# Patient Record
Sex: Female | Born: 1990 | Race: White | Hispanic: No | Marital: Married | Smoking: Never smoker
Health system: Southern US, Community
[De-identification: ages and names within clinical notes are randomized; demographics above are authoritative.]

## PROBLEM LIST (undated history)

## (undated) ENCOUNTER — Inpatient Hospital Stay (HOSPITAL_COMMUNITY): Payer: Self-pay

## (undated) DIAGNOSIS — R7303 Prediabetes: Secondary | ICD-10-CM

## (undated) DIAGNOSIS — N200 Calculus of kidney: Secondary | ICD-10-CM

## (undated) DIAGNOSIS — Z87442 Personal history of urinary calculi: Secondary | ICD-10-CM

## (undated) HISTORY — PX: LEG SURGERY: SHX1003

## (undated) HISTORY — PX: WISDOM TOOTH EXTRACTION: SHX21

## (undated) HISTORY — PX: KNEE ARTHROSCOPY: SUR90

## (undated) HISTORY — PX: OTHER SURGICAL HISTORY: SHX169

## (undated) HISTORY — DX: Prediabetes: R73.03

## (undated) HISTORY — PX: HARDWARE REMOVAL: SHX979

## (undated) HISTORY — PX: TONSILLECTOMY: SUR1361

---

## 2016-10-27 DIAGNOSIS — E6609 Other obesity due to excess calories: Secondary | ICD-10-CM | POA: Insufficient documentation

## 2016-10-27 DIAGNOSIS — Z6839 Body mass index (BMI) 39.0-39.9, adult: Secondary | ICD-10-CM | POA: Insufficient documentation

## 2017-08-08 DIAGNOSIS — Z87442 Personal history of urinary calculi: Secondary | ICD-10-CM

## 2017-08-08 HISTORY — DX: Personal history of urinary calculi: Z87.442

## 2017-10-24 DIAGNOSIS — Z8419 Family history of other disorders of kidney and ureter: Secondary | ICD-10-CM | POA: Insufficient documentation

## 2017-10-24 DIAGNOSIS — Z841 Family history of disorders of kidney and ureter: Secondary | ICD-10-CM | POA: Insufficient documentation

## 2018-05-29 ENCOUNTER — Telehealth: Payer: Self-pay | Admitting: *Deleted

## 2018-05-29 DIAGNOSIS — O3680X0 Pregnancy with inconclusive fetal viability, not applicable or unspecified: Secondary | ICD-10-CM

## 2018-05-29 NOTE — Telephone Encounter (Signed)
Received a voicemail  from [facility] from [place] ,[place] stating this patient has + pregnancy test which made her 397w6d according to LMP and has regular periods. States on ultrasound she measured 8924w6d and obtained FHR 3 different measurements- all in 80's.  States she needs followup and referred to our office. She also stated Patient denies bleeding , cramping and had a little spotting a week ago after intercourse.  I called [facility] and left message we have assumed care for the patient ; also asking for more information re: patient so that I can find chart. Called patient and  Left message I am calling to discuss making appointment , please call our office.  [facility] called back and left more information re: spelling of patient name, Date of birth.  Patient called back also and left message she is returning our call and was told [facility] we would call. I called Shabana and informed her we would set up an appointment for an ultrasound. I confirmed [facility] and I explained I would have registrar make an account for her and then I would order Kore[place] and call her back with appointment. She voices understanding.  I scheduled US and called Rae with the appointment and to come to office afterwards for results. She voices understanding.

## 2018-06-05 ENCOUNTER — Ambulatory Visit (HOSPITAL_COMMUNITY): Payer: Self-pay

## 2018-06-07 ENCOUNTER — Ambulatory Visit (HOSPITAL_COMMUNITY)
Admission: RE | Admit: 2018-06-07 | Discharge: 2018-06-07 | Disposition: A | Discharge status: Home / Self Care | Payer: Medicaid Other | Source: Ambulatory Visit | Attending: Obstetrics and Gynecology | Admitting: Obstetrics and Gynecology

## 2018-06-07 ENCOUNTER — Ambulatory Visit (INDEPENDENT_AMBULATORY_CARE_PROVIDER_SITE_OTHER): Payer: Self-pay | Admitting: *Deleted

## 2018-06-07 DIAGNOSIS — Z3A00 Weeks of gestation of pregnancy not specified: Secondary | ICD-10-CM | POA: Diagnosis not present

## 2018-06-07 DIAGNOSIS — O3680X0 Pregnancy with inconclusive fetal viability, not applicable or unspecified: Secondary | ICD-10-CM

## 2018-06-07 NOTE — Progress Notes (Signed)
Pt here for ultrasound results. She is referred from [facility] who obtained an ultrasound with FHR in the 80s.  Repeat ultrasound scheduled at that time and performed today.  Pt denies any bleeding, pain, cramping, or nausea/vomiting. Discussed results and pt history with Dr. Macon LargeAnyanwu and she ordered a repeat ultrasound in 7-10 days. US ordered and scheduled for 06/17/18 @ 0800.  Arrive at 0745 with a full bladder.  Pt verbalized understanding.

## 2018-06-07 NOTE — Progress Notes (Signed)
I have reviewed the chart and agree with nursing staff's documentation of this patient's encounter.  Jaynie CollinsUgonna Lemuel Boodram, MD 06/07/2018 11:25 AM

## 2018-06-12 ENCOUNTER — Inpatient Hospital Stay (HOSPITAL_COMMUNITY): Payer: Medicaid Other

## 2018-06-12 ENCOUNTER — Encounter (HOSPITAL_COMMUNITY): Payer: Self-pay | Admitting: *Deleted

## 2018-06-12 ENCOUNTER — Inpatient Hospital Stay (HOSPITAL_COMMUNITY)
Admission: AD | Admit: 2018-06-12 | Discharge: 2018-06-12 | Disposition: A | Discharge status: Home / Self Care | Payer: Medicaid Other | Source: Ambulatory Visit | Attending: Family Medicine | Admitting: Family Medicine

## 2018-06-12 DIAGNOSIS — Z3A08 8 weeks gestation of pregnancy: Secondary | ICD-10-CM | POA: Diagnosis not present

## 2018-06-12 DIAGNOSIS — O021 Missed abortion: Secondary | ICD-10-CM | POA: Insufficient documentation

## 2018-06-12 DIAGNOSIS — O209 Hemorrhage in early pregnancy, unspecified: Secondary | ICD-10-CM | POA: Diagnosis present

## 2018-06-12 DIAGNOSIS — Z6790 Unspecified blood type, Rh positive: Secondary | ICD-10-CM

## 2018-06-12 HISTORY — DX: Calculus of kidney: N20.0

## 2018-06-12 LAB — URINALYSIS, ROUTINE W REFLEX MICROSCOPIC
BILIRUBIN URINE: NEGATIVE
GLUCOSE, UA: NEGATIVE mg/dL
Ketones, ur: NEGATIVE mg/dL
LEUKOCYTES UA: NEGATIVE
NITRITE: POSITIVE — AB
Protein, ur: 100 mg/dL — AB
SPECIFIC GRAVITY, URINE: 1.025 (ref 1.005–1.030)
pH: 5 (ref 5.0–8.0)

## 2018-06-12 LAB — HCG, QUANTITATIVE, PREGNANCY: HCG, BETA CHAIN, QUANT, S: 13936 m[IU]/mL — AB (ref <5)

## 2018-06-12 LAB — CBC
HEMATOCRIT: 34.9 % — AB (ref 36.0–46.0)
Hemoglobin: 10.8 g/dL — ABNORMAL LOW (ref 12.0–15.0)
MCH: 23.2 pg — ABNORMAL LOW (ref 26.0–34.0)
MCHC: 30.9 g/dL (ref 30.0–36.0)
MCV: 75.1 fL — AB (ref 80.0–100.0)
NRBC: 0 % (ref 0.0–0.2)
PLATELETS: 250 10*3/uL (ref 150–400)
RBC: 4.65 MIL/uL (ref 3.87–5.11)
RDW: 16.9 % — ABNORMAL HIGH (ref 11.5–15.5)
WBC: 11.1 10*3/uL — ABNORMAL HIGH (ref 4.0–10.5)

## 2018-06-12 LAB — URINALYSIS, MICROSCOPIC (REFLEX)

## 2018-06-12 LAB — ABO/RH: ABO/RH(D): O POS

## 2018-06-12 MED ORDER — IBUPROFEN 600 MG PO TABS: 600.0000 mg | ORAL_TABLET | Freq: Four times a day (QID) | ORAL | 0 refills | Status: DC | PRN

## 2018-06-12 MED ORDER — MISOPROSTOL 200 MCG PO TABS
800.0000 ug | ORAL_TABLET | Freq: Once | ORAL | Status: AC
  Administered 2018-06-12: 800 ug via BUCCAL
  Filled 2018-06-12: qty 4

## 2018-06-12 MED ORDER — OXYCODONE-ACETAMINOPHEN 5-325 MG PO TABS
2.0000 | ORAL_TABLET | Freq: Once | ORAL | Status: AC
  Administered 2018-06-12: 2 via ORAL
  Filled 2018-06-12: qty 2

## 2018-06-12 NOTE — MAU Note (Signed)
Pt called per CNM request, informed prescription will be sent to her pharmacy.  Instructed to return to MAU with excessive bleeding, severe pain, passing out.  Pt verbalizes understanding.

## 2018-06-12 NOTE — MAU Note (Signed)
PT SAYS  SHE CAME [facility]- ON 11-6- 19.     TO COME HERE FOR U/S.    STARTED VAG BLEEDING  ON Sunday-- PINK WHEN SHE WIPES.  - TONIGHT WORSE  -.  HAS TOILET PAPER IN UNDERWEAR- IN TRIAGE- RED BLOOD ON UNDERWEAR . CRAMPS STARTED ON Sunday-  TONIGHT WORSE-  - NO MEDS FOR PAIN.

## 2018-06-12 NOTE — MAU Provider Note (Addendum)
History     CSN: [id number]  Arrival date and time: 06/12/18 [id number]   First Provider Initiated Contact with Patient 06/12/18 [phone]      Chief Complaint  Patient presents with  . Vaginal Bleeding   Leslie Arroyo is a 27 y.o. G1P0 at 4123w6d who presents today with vaginal bleeding. She states that this has been off and on, but today it became much heavier. She states that it is heavier than a period and she is passing "large clots". Patient had US on 06/07/18 that showed IUP with no cardiac activity. She is scheduled for FU on 06/17/18.   Vaginal Bleeding  The patient's primary symptoms include pelvic pain and vaginal bleeding. This is a new problem. The current episode started today. The problem occurs constantly. The problem has been unchanged. Pain severity now: 8/10. The problem affects both sides. She is pregnant. Pertinent negatives include no chills or fever. The vaginal discharge was bloody. The vaginal bleeding is heavier than menses. She has been passing clots. She has not been passing tissue. Her menstrual history has been regular (LMP 04/04/2018).    OB History    Gravida  1   Para      Term      Preterm      AB      Living        SAB      TAB      Ectopic      Multiple      Live Births              Past Medical History:  Diagnosis Date  . Kidney stones   . Kidney stones     Past Surgical History:  Procedure Laterality Date  . broken leg    . KNEE ARTHROSCOPY      History reviewed. No pertinent family history.  Social History   Tobacco Use  . Smoking status: Never Smoker  . Smokeless tobacco: Never Used  Substance Use Topics  . Alcohol use: Not Currently  . Drug use: Never    Allergies: No Known Allergies  No medications prior to admission.    Review of Systems  Constitutional: Negative for chills and fever.  Genitourinary: Positive for pelvic pain and vaginal bleeding.   Physical Exam   Blood pressure 137/77, pulse 86,  temperature 98.3 F (36.8 C), temperature source Oral, resp. rate 20, height 5\' 6"  (1.676 m), weight 112.2 kg, last menstrual period 04/12/2018.  Physical Exam  Nursing note and vitals reviewed. Constitutional: She is oriented to person, place, and time. She appears well-developed and well-nourished. She appears distressed (crying ).  HENT:  Head: Normocephalic.  Cardiovascular: Normal rate.  Respiratory: Effort normal.  GI: Soft.  Genitourinary:  Genitourinary Comments: Large amount of blood on her pad. One large clot about the size of an apple. Speculum exam deferred at this time.   Neurological: She is alert and oriented to person, place, and time.  Skin: Skin is warm and dry.  Psychiatric: She has a normal mood and affect.   Results for orders placed or performed during the hospital encounter of 06/12/18 (from the past 24 hour(s))  Urinalysis, Routine w reflex microscopic     Status: Abnormal   Collection Time: 06/12/18  5:31 AM  Result Value Ref Range   Color, Urine YELLOW YELLOW   APPearance CLEAR CLEAR   Specific Gravity, Urine 1.025 1.005 - 1.030   pH 5.0 5.0 - 8.0   Glucose, UA NEGATIVE  NEGATIVE mg/dL   Hgb urine dipstick LARGE (A) NEGATIVE   Bilirubin Urine NEGATIVE NEGATIVE   Ketones, ur NEGATIVE NEGATIVE mg/dL   Protein, ur 161 (A) NEGATIVE mg/dL   Nitrite POSITIVE (A) NEGATIVE   Leukocytes, UA NEGATIVE NEGATIVE  Urinalysis, Microscopic (reflex)     Status: Abnormal   Collection Time: 06/12/18  5:31 AM  Result Value Ref Range   RBC / HPF 21-50 0 - 5 RBC/hpf   WBC, UA 21-50 0 - 5 WBC/hpf   Bacteria, UA MANY (A) NONE SEEN   Squamous Epithelial / LPF 0-5 0 - 5  CBC     Status: Abnormal   Collection Time: 06/12/18  6:04 AM  Result Value Ref Range   WBC 11.1 (H) 4.0 - 10.5 K/uL   RBC 4.65 3.87 - 5.11 MIL/uL   Hemoglobin 10.8 (L) 12.0 - 15.0 g/dL   HCT 09.6 (L) 04.5 - 40.9 %   MCV 75.1 (L) 80.0 - 100.0 fL   MCH 23.2 (L) 26.0 - 34.0 pg   MCHC 30.9 30.0 - 36.0 g/dL    RDW 81.1 (H) 91.4 - 15.5 %   Platelets 250 150 - 400 K/uL   nRBC 0.0 0.0 - 0.2 %  ABO/Rh     Status: None (Preliminary result)   Collection Time: 06/12/18  6:04 AM  Result Value Ref Range   ABO/RH(D)      O POS Performed at [facility], [address] [place], [place] [address]    US Ob Transvaginal  Result Date: 06/12/2018 CLINICAL DATA:  27 year old pregnant female with vaginal bleeding. LMP: 04/12/2018 corresponding to an estimated gestational age of [redacted] weeks, 5 days. The estimated gestational age based on the first ultrasound is 7 weeks, 0 days. EXAM: OBSTETRIC <14 WK [place] AND TRANSVAGINAL OB US TECHNIQUE: Both transabdominal and transvaginal ultrasound examinations were performed for complete evaluation of the gestation as well as the maternal uterus, adnexal regions, and pelvic cul-de-sac. Transvaginal technique was performed to assess early pregnancy. COMPARISON:  Ultrasound dated 06/07/2018 FINDINGS: Intrauterine gestational sac: Single teardrop appearing gestational sac in the lower endometrium, lower in location than prior ultrasound. Yolk sac:  Not seen Embryo:  Present Cardiac Activity: Not detected CRL:  10 mm   7 w   0 d Subchorionic hemorrhage:  None visualized. Maternal uterus/adnexae: The maternal ovaries are not visualized. IMPRESSION: Single intrauterine pregnancy with an estimated gestational age of [redacted] weeks, 0 days. The gestational sac located abnormally in the lower endometrium. No fetal cardiac activity identified. Findings meet definitive criteria for failed pregnancy. This follows SRU consensus guidelines: Diagnostic Criteria for Nonviable Pregnancy Early in the First Trimester. [facility]acy Mis J Med [phone]. These results were called by telephone at the time of interpretation on 06/12/2018 at 6:48 am to nurse midwife [facility] , who verbally acknowledged these results. Electronically Signed   By: Elgie Collard M.D.   On: 06/12/2018 06:53   MAU Course   Procedures  MDM DW patient and offered Cytotec and close observation here due to heavy bleeding and tissue is in LUS and will likely pass soon. Patient agreeable with plan. Will give Cytotec and pain medication and continue to monitor. Emotional support provided.   0830: Received care of [place] from [place], [place]  6295: Pt reports passing fist sized clot in BR. Speculum exam: large blood and clots in vault, POC protruding from os, removed easily with ring forceps and sent to path. Bleeding minimal. Will monitor bleeding and discharge home.  RN reports pt left before signing d/c paperwork   Assessment and Plan   1. Missed abortion   2. Blood type, Rh positive    DC home Comfort measures reviewed  Bleeding precautions RX: ibuprofen 600mg  PRN Return to MAU as needed FU with [facility]OC in 2 weeks- message sent    Allergies as of 06/12/2018   No Known Allergies     Medication List    TAKE these medications   ibuprofen 600 MG tablet Commonly known as:  ADVIL,MOTRIN Take 1 tablet (600 mg total) by mouth every 6 (six) hours as needed.      Donette Larry, CNM  06/12/2018 1:42 PM

## 2018-06-12 NOTE — MAU Note (Signed)
Pt's room found empty, pt did not inform staff she was leaving.

## 2018-06-13 LAB — CULTURE, OB URINE: Culture: <10000 — AB

## 2018-06-17 ENCOUNTER — Ambulatory Visit (HOSPITAL_COMMUNITY): Payer: Self-pay

## 2018-07-01 ENCOUNTER — Encounter: Payer: Self-pay | Admitting: Advanced Practice Midwife

## 2018-07-01 ENCOUNTER — Ambulatory Visit (INDEPENDENT_AMBULATORY_CARE_PROVIDER_SITE_OTHER): Payer: Self-pay | Admitting: Advanced Practice Midwife

## 2018-07-01 VITALS — BP 141/97 | HR 80 | Wt 247.5 lb

## 2018-07-01 DIAGNOSIS — O039 Complete or unspecified spontaneous abortion without complication: Secondary | ICD-10-CM

## 2018-07-01 NOTE — Progress Notes (Signed)
  Subjective:     Patient ID: Leslie Arroyo, female   DOB: December 25, 1990, 27 y.o.   MRN: [id number]  Leslie AlphaMelissa Stander is a 27 y.o. G1P0 who is here today for SAB follow up. She was seen in MAU on 06/12/18 and dx with SAB. She had heavy bleeding at that time, and was given cytotec in MAU. After a period of observation patient was still bleeding and on exam POCs found at the cervix and removed by CNM in MAU. Patient is here today, and reports minimal bleeding at this time. Patient and partner reports normal processing through miscarriage emotions.   Review of Systems  Constitutional: Negative for chills and fever.  Gastrointestinal: Negative for nausea and vomiting.  Genitourinary: Positive for vaginal bleeding. Negative for pelvic pain.   Pathology report:  Diagnosis Products of Conception, uterine contents - PRODUCTS OF CONCEPTION (CHORIONIC VILLI PRESENT).  Most recent HCG   Ref. Range 06/12/2018 06:04  HCG, Beta Chain, Quant, S Latest Ref Range: <5 mIU/mL 13,936 (H)   Objective:   Physical Exam Vitals signs and nursing note reviewed.  Constitutional:      General: She is not in acute distress. HENT:     Head: Normocephalic.  Cardiovascular:     Rate and Rhythm: Normal rate.  Pulmonary:     Effort: Pulmonary effort is normal.  Skin:    General: Skin is warm and dry.  Neurological:     Mental Status: She is alert and oriented to person, place, and time.  Psychiatric:        Mood and Affect: Mood normal.    Assessment:   1. Miscarriage     Plan:   Orders Placed This Encounter  Procedures  . CBC  . Beta hCG quant (ref lab)   - Emotional support provided - Offered for patient to see Asher MuirJamie today or any time as they continue to move through the process - Will recheck HCG as needed    Thressa ShellerHeather Hogan DNP, CNM  07/01/18  8:39 AM

## 2018-07-02 ENCOUNTER — Telehealth: Payer: Self-pay | Admitting: *Deleted

## 2018-07-02 LAB — BETA HCG QUANT (REF LAB): hCG Quant: 20 m[IU]/mL

## 2018-07-02 LAB — CBC
Hematocrit: 32.4 % — ABNORMAL LOW (ref 34.0–46.6)
Hemoglobin: 10.1 g/dL — ABNORMAL LOW (ref 11.1–15.9)
MCH: 22.4 pg — AB (ref 26.6–33.0)
MCHC: 31.2 g/dL — ABNORMAL LOW (ref 31.5–35.7)
MCV: 72 fL — ABNORMAL LOW (ref 79–97)
Platelets: 359 10*3/uL (ref 150–450)
RBC: 4.51 x10E6/uL (ref 3.77–5.28)
RDW: 16 % — ABNORMAL HIGH (ref 12.3–15.4)
WBC: 9.9 10*3/uL (ref 3.4–10.8)

## 2018-07-02 NOTE — Telephone Encounter (Signed)
Called pt to inform her that her bhcg level was 20 and that, per the provider, she no longer needs to have these levels followed. Pt verbalized understanding.

## 2018-07-02 NOTE — Telephone Encounter (Signed)
-----   Message from Armando ReichertHeather D Hogan, CNM sent at 07/02/2018 10:12 AM EST ----- Patient hcg is less than 25. No follow up needed. Please call patient.

## 2018-09-14 ENCOUNTER — Emergency Department (HOSPITAL_COMMUNITY)
Admission: EM | Admit: 2018-09-14 | Discharge: 2018-09-14 | Disposition: A | Discharge status: Home / Self Care | Payer: Medicaid Other | Attending: Emergency Medicine | Admitting: Emergency Medicine

## 2018-09-14 ENCOUNTER — Encounter (HOSPITAL_COMMUNITY): Payer: Self-pay | Admitting: Emergency Medicine

## 2018-09-14 ENCOUNTER — Other Ambulatory Visit: Payer: Self-pay

## 2018-09-14 ENCOUNTER — Emergency Department (HOSPITAL_COMMUNITY): Payer: Medicaid Other

## 2018-09-14 DIAGNOSIS — R1032 Left lower quadrant pain: Secondary | ICD-10-CM | POA: Insufficient documentation

## 2018-09-14 DIAGNOSIS — R109 Unspecified abdominal pain: Secondary | ICD-10-CM

## 2018-09-14 DIAGNOSIS — N390 Urinary tract infection, site not specified: Secondary | ICD-10-CM

## 2018-09-14 DIAGNOSIS — N23 Unspecified renal colic: Secondary | ICD-10-CM

## 2018-09-14 LAB — BASIC METABOLIC PANEL
ANION GAP: 9 (ref 5–15)
BUN: 16 mg/dL (ref 6–20)
CALCIUM: 8.7 mg/dL — AB (ref 8.9–10.3)
CO2: 22 mmol/L (ref 22–32)
Chloride: 107 mmol/L (ref 98–111)
Creatinine, Ser: 0.99 mg/dL (ref 0.44–1.00)
GFR calc Af Amer: >60 mL/min (ref >60)
GFR calc non Af Amer: >60 mL/min (ref >60)
Glucose, Bld: 132 mg/dL — ABNORMAL HIGH (ref 70–99)
Potassium: 4.2 mmol/L (ref 3.5–5.1)
Sodium: 138 mmol/L (ref 135–145)

## 2018-09-14 LAB — CBC WITH DIFFERENTIAL/PLATELET
Abs Immature Granulocytes: 0.07 10*3/uL (ref 0.00–0.07)
Basophils Absolute: 0.1 10*3/uL (ref 0.0–0.1)
Basophils Relative: 1 %
Eosinophils Absolute: 0.1 10*3/uL (ref 0.0–0.5)
Eosinophils Relative: 1 %
HCT: 36.7 % (ref 36.0–46.0)
Hemoglobin: 10.6 g/dL — ABNORMAL LOW (ref 12.0–15.0)
Immature Granulocytes: 1 %
Lymphocytes Relative: 19 %
Lymphs Abs: 2 10*3/uL (ref 0.7–4.0)
MCH: 21.4 pg — ABNORMAL LOW (ref 26.0–34.0)
MCHC: 28.9 g/dL — ABNORMAL LOW (ref 30.0–36.0)
MCV: 74 fL — ABNORMAL LOW (ref 80.0–100.0)
Monocytes Absolute: 0.6 10*3/uL (ref 0.1–1.0)
Monocytes Relative: 6 %
NEUTROS PCT: 72 %
Neutro Abs: 7.4 10*3/uL (ref 1.7–7.7)
Platelets: 324 10*3/uL (ref 150–400)
RBC: 4.96 MIL/uL (ref 3.87–5.11)
RDW: 17 % — ABNORMAL HIGH (ref 11.5–15.5)
WBC: 10.2 10*3/uL (ref 4.0–10.5)
nRBC: 0 % (ref 0.0–0.2)

## 2018-09-14 LAB — URINALYSIS, ROUTINE W REFLEX MICROSCOPIC
Bilirubin Urine: NEGATIVE
Glucose, UA: NEGATIVE mg/dL
Ketones, ur: NEGATIVE mg/dL
NITRITE: NEGATIVE
PH: 5 (ref 5.0–8.0)
Protein, ur: 30 mg/dL — AB
RBC / HPF: >50 RBC/hpf — ABNORMAL HIGH (ref 0–5)
Specific Gravity, Urine: 1.029 (ref 1.005–1.030)
Squamous Epithelial / HPF: >50 — ABNORMAL HIGH (ref 0–5)
WBC, UA: >50 WBC/hpf — ABNORMAL HIGH (ref 0–5)

## 2018-09-14 LAB — PREGNANCY, URINE: Preg Test, Ur: NEGATIVE

## 2018-09-14 MED ORDER — OXYCODONE-ACETAMINOPHEN 5-325 MG PO TABS: 1.0000 | ORAL_TABLET | ORAL | 0 refills | Status: DC | PRN

## 2018-09-14 MED ORDER — SODIUM CHLORIDE 0.9 % IV SOLN
Freq: Once | INTRAVENOUS | Status: AC
  Administered 2018-09-14: 08:00:00 via INTRAVENOUS

## 2018-09-14 MED ORDER — SODIUM CHLORIDE 0.9 % IV SOLN
2.0000 g | Freq: Once | INTRAVENOUS | Status: AC
  Administered 2018-09-14: 2 g via INTRAVENOUS
  Filled 2018-09-14: qty 20

## 2018-09-14 MED ORDER — SODIUM CHLORIDE 0.9 % IV BOLUS
1000.0000 mL | Freq: Once | INTRAVENOUS | Status: AC
  Administered 2018-09-14: 1000 mL via INTRAVENOUS

## 2018-09-14 MED ORDER — ONDANSETRON 8 MG PO TBDP: 8.0000 mg | ORAL_TABLET | Freq: Three times a day (TID) | ORAL | 0 refills | Status: DC | PRN

## 2018-09-14 MED ORDER — TAMSULOSIN HCL 0.4 MG PO CAPS: 0.4000 mg | ORAL_CAPSULE | Freq: Every day | ORAL | 0 refills | Status: DC

## 2018-09-14 MED ORDER — CEPHALEXIN 500 MG PO CAPS: 500.0000 mg | ORAL_CAPSULE | Freq: Four times a day (QID) | ORAL | 0 refills | Status: DC

## 2018-09-14 MED ORDER — KETOROLAC TROMETHAMINE 15 MG/ML IJ SOLN
15.0000 mg | Freq: Once | INTRAMUSCULAR | Status: AC
  Administered 2018-09-14: 15 mg via INTRAVENOUS
  Filled 2018-09-14: qty 1

## 2018-09-14 MED ORDER — MORPHINE SULFATE (PF) 4 MG/ML IV SOLN
4.0000 mg | Freq: Once | INTRAVENOUS | Status: AC
  Administered 2018-09-14: 4 mg via INTRAVENOUS
  Filled 2018-09-14: qty 1

## 2018-09-14 MED ORDER — ONDANSETRON HCL 4 MG/2ML IJ SOLN
4.0000 mg | Freq: Once | INTRAMUSCULAR | Status: AC
  Administered 2018-09-14: 4 mg via INTRAVENOUS
  Filled 2018-09-14: qty 2

## 2018-09-14 MED ORDER — HYDROMORPHONE HCL 1 MG/ML IJ SOLN
1.0000 mg | Freq: Once | INTRAMUSCULAR | Status: AC
  Administered 2018-09-14: 1 mg via INTRAVENOUS
  Filled 2018-09-14: qty 1

## 2018-09-14 NOTE — ED Triage Notes (Signed)
Patient presents with sudden onset of left flank pain that woke her up. Patient hx kidney stones. States lower body aches yesterday, with some dry heaving. Denies fevers and urinary symptoms. States it feels like previous stones.

## 2018-09-14 NOTE — ED Provider Notes (Signed)
Patient signed to me from Dr. Erma Heritage pending CT results.  Patient with 7 mm kidney stone without hydro-.  Case discussed with Dr. Kathrynn Running from urology who recommends patient follow-up next week in the office.  He reviewed her CT results.  This plan was discussed with the patient and she agrees to this and was given strict return precautions   Lorre Nick, MD 09/14/18 [phone]

## 2018-09-14 NOTE — ED Notes (Signed)
PT DISCHARGED. INSTRUCTIONS AND PRESCRIPTIONS GIVEN. AAOX4. PT IN NO APPARENT DISTRESS WITH MILD PAIN. THE OPPORTUNITY TO ASK QUESTIONS WAS PROVIDED. 

## 2018-09-14 NOTE — Discharge Instructions (Addendum)
Call the urologist office on Monday to schedule an appointment to be seen.  Return here at once should you develop fever, severe pain, or any other problems

## 2018-09-14 NOTE — ED Provider Notes (Signed)
[facility]-EMERGENCY DEPT Provider Note   CSN: [id number] Arrival date & time: 09/14/18  8756    History   Chief Complaint Chief Complaint  Patient presents with  . Flank Pain    HPI Leslie Arroyo is a 28 y.o. female.     HPI   28 yo female with history of kidney stones here with acute onset of left flank pain.  The patient states that starting yesterday afternoon, she developed acute onset of severe, aching, cramp-like, left flank pain.  Is also in her left lower quadrant.  She said nausea but no vomiting.  No change in bowel habits.  No fever or chills.  Since then, the patient has had persistent, worsening pain.  It seems to come and go intermittently, but has been more constant over the last several hours so she presents for evaluation.  She does have history of kidney stones.  No history of previous instrumentation or requirement for lithotripsy.  She was previously seen in another city, but now lives in [place] she does not have a urologist here.  Past Medical History:  Diagnosis Date  . Kidney stones   . Kidney stones     There are no active problems to display for this patient.   Past Surgical History:  Procedure Laterality Date  . broken leg    . KNEE ARTHROSCOPY       OB History    Gravida  1   Para      Term      Preterm      AB      Living        SAB      TAB      Ectopic      Multiple      Live Births               Home Medications    Prior to Admission medications   Medication Sig Start Date End Date Taking? Authorizing Provider  ibuprofen (ADVIL,MOTRIN) 600 MG tablet Take 1 tablet (600 mg total) by mouth every 6 (six) hours as needed. 06/12/18   Donette Larry, CNM    Family History No family history on file.  Social History Social History   Tobacco Use  . Smoking status: Never Smoker  . Smokeless tobacco: Never Used  Substance Use Topics  . Alcohol use: Not Currently  . Drug use: Never      Allergies   Patient has no known allergies.   Review of Systems Review of Systems  Constitutional: Positive for fatigue. Negative for chills and fever.  HENT: Negative for congestion and rhinorrhea.   Eyes: Negative for visual disturbance.  Respiratory: Negative for cough, shortness of breath and wheezing.   Cardiovascular: Negative for chest pain and leg swelling.  Gastrointestinal: Positive for nausea. Negative for abdominal pain, diarrhea and vomiting.  Genitourinary: Positive for flank pain. Negative for dysuria.  Musculoskeletal: Negative for neck pain and neck stiffness.  Skin: Negative for rash and wound.  Allergic/Immunologic: Negative for immunocompromised state.  Neurological: Negative for syncope, weakness and headaches.  All other systems reviewed and are negative.    Physical Exam Updated Vital Signs BP 125/78 (BP Location: Right Arm)   Pulse 66   Temp 97.8 F (36.6 C) (Oral)   Resp 16   Ht 5\' 6"  (1.676 m)   Wt 112 kg   LMP 04/12/2018 (LMP Unknown)   SpO2 95%   BMI 39.87 kg/m   Physical Exam  Vitals signs and nursing note reviewed.  Constitutional:      General: She is not in acute distress.    Appearance: She is well-developed.     Comments: Appears uncomfortable  HENT:     Head: Normocephalic and atraumatic.  Eyes:     Conjunctiva/sclera: Conjunctivae normal.  Neck:     Musculoskeletal: Neck supple.  Cardiovascular:     Rate and Rhythm: Normal rate and regular rhythm.     Heart sounds: Normal heart sounds. No murmur. No friction rub.  Pulmonary:     Effort: Pulmonary effort is normal. No respiratory distress.     Breath sounds: Normal breath sounds. No wheezing or rales.  Abdominal:     General: There is no distension.     Palpations: Abdomen is soft.     Tenderness: There is abdominal tenderness.     Comments: Moderate tenderness over the left lower flank, no overt CVA tenderness.  No right lower quadrant tenderness.  No rebound or  guarding.  Skin:    General: Skin is warm.     Capillary Refill: Capillary refill takes less than 2 seconds.  Neurological:     Mental Status: She is alert and oriented to person, place, and time.     Motor: No abnormal muscle tone.      ED Treatments / Results  Labs (all labs ordered are listed, but only abnormal results are displayed) Labs Reviewed  URINALYSIS, ROUTINE W REFLEX MICROSCOPIC - Abnormal; Notable for the following components:      Result Value   APPearance CLOUDY (*)    Hgb urine dipstick LARGE (*)    Protein, ur 30 (*)    Leukocytes,Ua LARGE (*)    RBC / HPF >50 (*)    WBC, UA >50 (*)    Bacteria, UA FEW (*)    Squamous Epithelial / LPF >50 (*)    All other components within normal limits  URINE CULTURE  PREGNANCY, URINE  CBC WITH DIFFERENTIAL/PLATELET  BASIC METABOLIC PANEL    EKG None  Radiology US Renal  Result Date: 09/14/2018 CLINICAL DATA:  28 year old female with acute LEFT flank pain. EXAM: RENAL / URINARY TRACT ULTRASOUND COMPLETE COMPARISON:  None. FINDINGS: Right Kidney: Renal measurements: 11.3 x 4.5 x 4.7 cm = volume: 125 mL . Echogenicity within normal limits. No mass or hydronephrosis visualized. Left Kidney: Renal measurements: 11.1 x 5.2 x 5.3 cm = volume: 161 mL. Echogenicity within normal limits. No mass or hydronephrosis visualized. Bladder: Appears normal for degree of bladder distention. IMPRESSION: Unremarkable renal ultrasound.  No evidence of hydronephrosis. Electronically Signed   By: Harmon Pier M.D.   On: 09/14/2018 07:21    Procedures Procedures (including critical care time)  Medications Ordered in ED Medications  cefTRIAXone (ROCEPHIN) 2 g in sodium chloride 0.9 % 100 mL IVPB (2 g Intravenous New Bag/Given 09/14/18 0801)  ketorolac (TORADOL) 15 MG/ML injection 15 mg (has no administration in time range)  morphine 4 MG/ML injection 4 mg (has no administration in time range)  HYDROmorphone (DILAUDID) injection 1 mg (1 mg  Intravenous Given 09/14/18 0722)  ondansetron (ZOFRAN) injection 4 mg (4 mg Intravenous Given 09/14/18 0722)  0.9 %  sodium chloride infusion ( Intravenous New Bag/Given 09/14/18 0730)  sodium chloride 0.9 % bolus 1,000 mL (1,000 mLs Intravenous New Bag/Given 09/14/18 0721)     Initial Impression / Assessment and Plan / ED Course  I have reviewed the triage vital signs and the nursing notes.  Pertinent  labs & imaging results that were available during my care of the patient were reviewed by me and considered in my medical decision making (see chart for details).        28 yo F here with acute onset of left flank pain.  Symptoms are similar to her previous episodes of nephrolithiasis.  I suspect this is recurrent renal colic, though differential includes acute pyelonephritis, less likely ovarian cyst or torsion. No vaginal bleeding, discharge, or sx to suggest PID. Will check U/S, UA, labs and imaging.  U/S unremarkable. Given her persistent pain, will pursue CT for further evaluation. IVF given, pt feels improved with analgesia.  Urinalysis does show significant pyuria and hematuria.  While she does have possible contamination, I suspect this is consistent with infection.  Will start Rocephin and treat her for possible pyelonephritis, with follow-up of CT.  If CT negative for stone and patient feels improved, can likely treat as pyelonephritis.  If CT shows stone in pain does not resolve or she develops signs or symptoms to suggest infected stone, would consider further evaluation and treatment.   Final Clinical Impressions(s) / ED Diagnoses   Final diagnoses:  Left flank pain    ED Discharge Orders    None       Shaune PollackIsaacs, Ioane Bhola, MD 09/14/18 [phone]

## 2018-09-14 NOTE — ED Notes (Signed)
Lab called. Leslie Arroyo answered and said that they will add on the urine pregnancy when they start processing the urine specimen.

## 2018-09-15 LAB — URINE CULTURE: Special Requests: NORMAL

## 2018-09-16 ENCOUNTER — Encounter (HOSPITAL_COMMUNITY)
Admission: RE | Disposition: A | Discharge status: Home / Self Care | Payer: Self-pay | Source: Ambulatory Visit | Attending: Urology

## 2018-09-16 ENCOUNTER — Ambulatory Visit (HOSPITAL_COMMUNITY): Payer: Medicaid Other

## 2018-09-16 ENCOUNTER — Ambulatory Visit (HOSPITAL_COMMUNITY)
Admission: RE | Admit: 2018-09-16 | Discharge: 2018-09-16 | Disposition: A | Discharge status: Home / Self Care | Payer: Medicaid Other | Source: Ambulatory Visit | Attending: Urology | Admitting: Urology

## 2018-09-16 ENCOUNTER — Other Ambulatory Visit: Payer: Self-pay | Admitting: Urology

## 2018-09-16 ENCOUNTER — Encounter (HOSPITAL_COMMUNITY): Payer: Self-pay | Admitting: General Practice

## 2018-09-16 DIAGNOSIS — N202 Calculus of kidney with calculus of ureter: Secondary | ICD-10-CM | POA: Diagnosis not present

## 2018-09-16 DIAGNOSIS — N201 Calculus of ureter: Secondary | ICD-10-CM | POA: Diagnosis present

## 2018-09-16 DIAGNOSIS — N135 Crossing vessel and stricture of ureter without hydronephrosis: Secondary | ICD-10-CM

## 2018-09-16 DIAGNOSIS — Z87442 Personal history of urinary calculi: Secondary | ICD-10-CM | POA: Insufficient documentation

## 2018-09-16 HISTORY — PX: EXTRACORPOREAL SHOCK WAVE LITHOTRIPSY: SHX1557

## 2018-09-16 HISTORY — DX: Personal history of urinary calculi: Z87.442

## 2018-09-16 SURGERY — LITHOTRIPSY, ESWL
Anesthesia: LOCAL | Laterality: Left

## 2018-09-16 MED ORDER — SODIUM CHLORIDE 0.9 % IV SOLN
INTRAVENOUS | Status: DC
  Administered 2018-09-16: 14:00:00 via INTRAVENOUS

## 2018-09-16 MED ORDER — DIAZEPAM 5 MG PO TABS
10.0000 mg | ORAL_TABLET | ORAL | Status: AC
  Administered 2018-09-16: 10 mg via ORAL
  Filled 2018-09-16: qty 2

## 2018-09-16 MED ORDER — CIPROFLOXACIN HCL 500 MG PO TABS
500.0000 mg | ORAL_TABLET | ORAL | Status: AC
  Administered 2018-09-16: 500 mg via ORAL
  Filled 2018-09-16: qty 1

## 2018-09-16 MED ORDER — OXYCODONE-ACETAMINOPHEN 5-325 MG PO TABS: 1.0000 | ORAL_TABLET | ORAL | 0 refills | Status: DC | PRN

## 2018-09-16 MED ORDER — DIPHENHYDRAMINE HCL 25 MG PO CAPS
25.0000 mg | ORAL_CAPSULE | ORAL | Status: AC
  Administered 2018-09-16: 25 mg via ORAL
  Filled 2018-09-16: qty 1

## 2018-09-16 NOTE — H&P (Signed)
Urology Preoperative H&P   Chief Complaint: left flank pain  History of Present Illness: Leslie Arroyo is a 28 y.o. female with a  history of kidney stones.  -She presented to the ED on 09/14/18 with worsening, intermittent, sharp, non-radiating left flank pain. She was given Norco, Keflex, tamsulosin and Zofran, which had partially alleviated her symptoms. Her pain is currently 5 out of 10, but she has persistent nausea and vomiting. She denies subjective fever or chills at home and is afebrile in the office today. Her other vital signs are within normal range. She is urinating without difficulty and denies dysuria or gross hematuria  -Hx of kidney stones x3--2 years ago-- did not require surgery   CTSS (09/14/18)  IMPRESSION:  7 mm proximal left ureteral calculus. No hydronephrosis.  Additional 3 mm nonobstructing left upper pole renal calculus.     Past Medical History:  Diagnosis Date  . History of kidney stones   . Kidney stones   . Kidney stones     Past Surgical History:  Procedure Laterality Date  . broken leg Right   . KNEE ARTHROSCOPY Left   . WISDOM TOOTH EXTRACTION      Allergies: No Known Allergies  History reviewed. No pertinent family history.  Social History:  reports that she has never smoked. She has never used smokeless tobacco. She reports previous alcohol use. She reports that she does not use drugs.  ROS: A complete review of systems was performed.  All systems are negative except for pertinent findings as noted.  Physical Exam:  Vital signs in last 24 hours: Temp:  [98.2 F (36.8 C)] 98.2 F (36.8 C) (03/16 1314) Pulse Rate:  [73] 73 (03/16 1314) Resp:  [20] 20 (03/16 1314) BP: (114)/(113) 114/113 (03/16 1314) SpO2:  [97 %] 97 % (03/16 1314) Weight:  [112.9 kg] 112.9 kg (03/16 1340) Constitutional:  Alert and oriented, No acute distress Cardiovascular: Regular rate and rhythm, No JVD Respiratory: Normal respiratory effort, Lungs clear  bilaterally GI: Abdomen is soft, nontender, nondistended, no abdominal masses GU: No CVA tenderness Lymphatic: No lymphadenopathy Neurologic: Grossly intact, no focal deficits Psychiatric: Normal mood and affect  Laboratory Data:  Recent Labs    09/14/18 0740  WBC 10.2  HGB 10.6*  HCT 36.7  PLT 324    Recent Labs    09/14/18 0740  NA 138  K 4.2  CL 107  GLUCOSE 132*  BUN 16  CALCIUM 8.7*  CREATININE 0.99     No results found for this or any previous visit (from the past 24 hour(s)). Recent Results (from the past 240 hour(s))  Urine C&S     Status: Abnormal   Collection Time: 09/14/18  5:52 AM  Result Value Ref Range Status   Specimen Description   Final    URINE, CLEAN CATCH Performed at [facility], [address], [place], [place] [address]    Special Requests   Final    Normal Performed at [facility], [address], [place], [place] [address]    Culture MULTIPLE SPECIES PRESENT, SUGGEST RECOLLECTION (A)  Final   Report Status 09/15/2018 FINAL  Final    Renal Function: Recent Labs    09/14/18 0740  CREATININE 0.99   Estimated Creatinine Clearance: 106.8 mL/min (by C-G formula based on SCr of 0.99 mg/dL).  Radiologic Imaging: Dg Abd 1 View  Result Date: 09/16/2018 CLINICAL DATA:  Preoperative evaluation for LEFT side kidney stone EXAM: ABDOMEN - 1 VIEW  COMPARISON:  CT abdomen and pelvis 09/14/2018 FINDINGS: LEFT paraspinal calcification at inferior L2 measuring 6 mm diameter consistent with LEFT UPJ versus proximal LEFT ureteral calculus. No additional urinary tract calcifications. Bowel gas pattern normal. Osseous structures unremarkable. IMPRESSION: LEFT paraspinal calculus 6 mm diameter at the inferior L2 level, either at LEFT ureteropelvic junction or proximal LEFT ureter. Electronically Signed   By: Ulyses Southward M.D.   On: 09/16/2018 14:09    I independently reviewed the above imaging studies.  Assessment  and Plan Besty Arroyo is a 28 y.o. female with a 7 mm left UPJ stone and renal colic  The risks, benefits and alternatives of LEFT ESWL was discussed with the patient. I described the risks which include arrhythmia, kidney contusion, kidney hemorrhage, need for transfusion, back discomfort, flank ecchymosis, flank abrasion, inability to fracture the stone, inability to pass stone fragments, Steinstrasse, infection associated with obstructing stones, need for an alternative surgical procedure and possible need for repeat shockwave lithotripsy.  The patient voices understanding and wishes to proceed.    Rhoderick Moody, MD 09/16/2018, 2:49 PM  [facility] Pager: [phone]

## 2018-09-16 NOTE — H&P (Signed)
[facility] Operative Note  Treating Physician: Rhoderick Moody, MD  Pre-op diagnosis: 7 mm left UVJ stone  Post-op diagnosis: Same   Procedure: LEFT ESWL  See Rojelio Brenner OP note scanned into chart. Also because of the size, density, location and other factors that cannot be anticipated I feel this will likely be a staged procedure. This fact supersedes any indication in the scanned [place] stone operative note to the contrary

## 2018-09-17 ENCOUNTER — Encounter (HOSPITAL_COMMUNITY): Payer: Self-pay | Admitting: Urology

## 2018-12-22 ENCOUNTER — Other Ambulatory Visit: Payer: Self-pay

## 2018-12-22 ENCOUNTER — Encounter (HOSPITAL_COMMUNITY): Payer: Self-pay | Admitting: Emergency Medicine

## 2018-12-22 ENCOUNTER — Emergency Department (HOSPITAL_COMMUNITY)
Admission: EM | Admit: 2018-12-22 | Discharge: 2018-12-22 | Disposition: A | Discharge status: Home / Self Care | Payer: Medicaid Other | Attending: Emergency Medicine | Admitting: Emergency Medicine

## 2018-12-22 DIAGNOSIS — J029 Acute pharyngitis, unspecified: Secondary | ICD-10-CM | POA: Diagnosis present

## 2018-12-22 DIAGNOSIS — J069 Acute upper respiratory infection, unspecified: Secondary | ICD-10-CM | POA: Insufficient documentation

## 2018-12-22 DIAGNOSIS — Z79899 Other long term (current) drug therapy: Secondary | ICD-10-CM | POA: Insufficient documentation

## 2018-12-22 DIAGNOSIS — Z20828 Contact with and (suspected) exposure to other viral communicable diseases: Secondary | ICD-10-CM | POA: Insufficient documentation

## 2018-12-22 NOTE — ED Notes (Signed)
Opened chart to clarify all her meds are OTC and no prescriptions were ordered.

## 2018-12-22 NOTE — ED Provider Notes (Signed)
[name]-EMERGENCY DEPT Provider Note   CSN: [id number] Arrival date & time: 12/22/18  1222    History   Chief Complaint Chief Complaint  Patient presents with  . Sore Throat  . Nasal Congestion  . Fatigue    HPI Leslie Arroyo is a 28 y.o. female.     28 yo female with complaint of fatigue, sore throat, congestion.  Patient states that she is been checking her temperature and her temperature is normally 97 degrees, her temperature recently has been 99.  Patient [place] traveled to [place], no known sick contacts, specifically no contact with anyone known to have COVID.  Patient is concerned that she has COVID.  Leslie Arroyo was evaluated in Emergency Department on 12/22/2018 for the symptoms described in the history of present illness. She was evaluated in the context of the global COVID-19 pandemic, which necessitated consideration that the patient might be at risk for infection with the SARS-CoV-2 virus that causes COVID-19. Institutional protocols and algorithms that pertain to the evaluation of patients at risk for COVID-19 are in a state of rapid change based on information released by regulatory bodies including the CDC and federal and state organizations. These policies and algorithms were followed during the patient's care in the ED.      Past Medical History:  Diagnosis Date  . History of kidney stones   . Kidney stones   . Kidney stones     There are no active problems to display for this patient.   Past Surgical History:  Procedure Laterality Date  . broken leg Right   . EXTRACORPOREAL SHOCK WAVE LITHOTRIPSY Left 09/16/2018   Procedure: EXTRACORPOREAL SHOCK WAVE LITHOTRIPSY ([facility]);  Surgeon: Rene PaciWinter, Christopher Aaron, MD;  Location: [facility];  Service: Urology;  Laterality: Left;  . KNEE ARTHROSCOPY Left   . WISDOM TOOTH EXTRACTION       OB History    Gravida  1   Para      Term      Preterm      AB      Living        SAB       TAB      Ectopic      Multiple      Live Births               Home Medications    Prior to Admission medications   Medication Sig Start Date End Date Taking? Authorizing Provider  acetaminophen (TYLENOL) 325 MG tablet Take 650 mg by mouth every 6 (six) hours as needed for moderate pain.    [provider]  cephALEXin (KEFLEX) 500 MG capsule Take 1 capsule (500 mg total) by mouth 4 (four) times daily. 09/14/18   Lorre NickAllen, Anthony, MD  ondansetron (ZOFRAN ODT) 8 MG disintegrating tablet Take 1 tablet (8 mg total) by mouth every 8 (eight) hours as needed for nausea or vomiting. 09/14/18   Lorre NickAllen, Anthony, MD  oxyCODONE-acetaminophen (PERCOCET/ROXICET) 5-325 MG tablet Take 1-2 tablets by mouth every 4 (four) hours as needed for moderate pain or severe pain. 09/16/18   Rene PaciWinter, Christopher Aaron, MD  tamsulosin (FLOMAX) 0.4 MG CAPS capsule Take 1 capsule (0.4 mg total) by mouth daily. 09/14/18   Lorre NickAllen, Anthony, MD    Family History No family history on file.  Social History Social History   Tobacco Use  . Smoking status: Never Smoker  . Smokeless tobacco: Never Used  Substance Use Topics  . Alcohol use: Not  Currently  . Drug use: Never     Allergies   Patient has no known allergies.   Review of Systems Review of Systems  Constitutional: Positive for fatigue and fever.  HENT: Positive for congestion and sore throat. Negative for rhinorrhea, sinus pressure and sinus pain.   Eyes: Negative for discharge and redness.  Respiratory: Negative for cough.   Gastrointestinal: Negative for abdominal pain, nausea and vomiting.  Musculoskeletal: Negative for arthralgias and myalgias.  Skin: Negative for rash and wound.  Allergic/Immunologic: Negative for immunocompromised state.  Neurological: Negative for weakness.  Hematological: Negative for adenopathy.  Psychiatric/Behavioral: Negative for confusion.  All other systems reviewed and are negative.    Physical Exam  Updated Vital Signs BP (!) 160/104 (BP Location: Left Arm)   Pulse 89   Temp 98.5 F (36.9 C) (Oral)   Resp 17   LMP 11/18/2018   SpO2 98%   Physical Exam Vitals signs and nursing note reviewed.  Constitutional:      General: She is not in acute distress.    Appearance: She is well-developed. She is not diaphoretic.  HENT:     Head: Normocephalic and atraumatic.     Right Ear: Tympanic membrane and ear canal normal.     Left Ear: Tympanic membrane and ear canal normal.     Nose: No congestion or rhinorrhea.     Mouth/Throat:     Pharynx: Uvula midline. No pharyngeal swelling or posterior oropharyngeal erythema.     Tonsils: No tonsillar exudate or tonsillar abscesses.  Eyes:     Conjunctiva/sclera: Conjunctivae normal.  Neck:     Musculoskeletal: Neck supple.  Cardiovascular:     Rate and Rhythm: Normal rate and regular rhythm.     Heart sounds: Normal heart sounds.  Pulmonary:     Effort: Pulmonary effort is normal.     Breath sounds: Normal breath sounds.  Skin:    General: Skin is warm and dry.     Findings: No erythema or rash.  Neurological:     Mental Status: She is alert and oriented to person, place, and time.  Psychiatric:        Behavior: Behavior normal.      ED Treatments / Results  Labs (all labs ordered are listed, but only abnormal results are displayed) Labs Reviewed  NOVEL CORONAVIRUS, NAA (HOSPITAL ORDER, SEND-OUT TO REF LAB)    EKG None  Radiology No results found.  Procedures Procedures (including critical care time)  Medications Ordered in ED Medications - No data to display   Initial Impression / Assessment and Plan / ED Course  I have reviewed the triage vital signs and the nursing notes.  Pertinent labs & imaging results that were available during my care of the patient were reviewed by me and considered in my medical decision making (see chart for details).  Clinical Course as of Dec 22 1411  Sun Dec 22, 5366  1845  28 year old female presents with complaint of sore throat, fatigue, nasal congestion, concerned she has COVID.  Exam is unremarkable, blood pressure is elevated, vital signs otherwise normal.  Lung sounds are clear.  Patient would like to be tested for COVID, send out test ordered and patient advised to call for her results in 2 to 3 days.  Return to ER for new or worsening symptoms.   [LM]    Clinical Course User Index [LM] Tacy Learn, PA-C      Final Clinical Impressions(s) / ED Diagnoses  Final diagnoses:  Upper respiratory tract infection, unspecified type    ED Discharge Orders    None       Alden HippMurphy, Wednesday Ericsson A, PA-C 12/22/18 1414    Gerhard MunchLockwood, Robert, MD 12/24/18 [phone]

## 2018-12-22 NOTE — ED Triage Notes (Signed)
Pt reports was in [place] beach last weekend. Reports yesterday sore throat, congestion, fevers normally 97, earlier was 99.1, having fatigue.

## 2018-12-22 NOTE — Discharge Instructions (Addendum)
Consider allergies, common cold or COVID.  Call for your COVID results in 2-3 days. Return to ER for worsening symptoms, follow up with your doctor. OTC Cold medications- Coricidin HBP, Zyrtec, Flonase as directed.

## 2018-12-24 LAB — NOVEL CORONAVIRUS, NAA (HOSP ORDER, SEND-OUT TO REF LAB; TAT 18-24 HRS): SARS-CoV-2, NAA: NOT DETECTED

## 2019-11-19 ENCOUNTER — Other Ambulatory Visit: Payer: Self-pay

## 2019-11-19 ENCOUNTER — Encounter (HOSPITAL_COMMUNITY): Payer: Self-pay | Admitting: Emergency Medicine

## 2019-11-19 ENCOUNTER — Emergency Department (HOSPITAL_COMMUNITY)
Admission: EM | Admit: 2019-11-19 | Discharge: 2019-11-19 | Disposition: A | Discharge status: Home / Self Care | Payer: Medicaid Other | Attending: Emergency Medicine | Admitting: Emergency Medicine

## 2019-11-19 DIAGNOSIS — N3001 Acute cystitis with hematuria: Secondary | ICD-10-CM | POA: Diagnosis not present

## 2019-11-19 DIAGNOSIS — R300 Dysuria: Secondary | ICD-10-CM | POA: Diagnosis present

## 2019-11-19 DIAGNOSIS — Z79899 Other long term (current) drug therapy: Secondary | ICD-10-CM | POA: Insufficient documentation

## 2019-11-19 LAB — URINALYSIS, ROUTINE W REFLEX MICROSCOPIC
Bacteria, UA: NONE SEEN
Bilirubin Urine: NEGATIVE
Glucose, UA: NEGATIVE mg/dL
Ketones, ur: 5 mg/dL — AB
Leukocytes,Ua: NEGATIVE
Nitrite: NEGATIVE
Protein, ur: >=300 mg/dL — AB
RBC / HPF: >50 RBC/hpf — ABNORMAL HIGH (ref 0–5)
Specific Gravity, Urine: 1.016 (ref 1.005–1.030)
pH: 5 (ref 5.0–8.0)

## 2019-11-19 LAB — PREGNANCY, URINE: Preg Test, Ur: NEGATIVE

## 2019-11-19 MED ORDER — CEPHALEXIN 500 MG PO CAPS: 500.0000 mg | ORAL_CAPSULE | Freq: Three times a day (TID) | ORAL | 0 refills | Status: AC

## 2019-11-19 MED ORDER — CEPHALEXIN 500 MG PO CAPS
500.0000 mg | ORAL_CAPSULE | Freq: Once | ORAL | Status: AC
  Administered 2019-11-19: 500 mg via ORAL
  Filled 2019-11-19: qty 1

## 2019-11-19 MED ORDER — PHENAZOPYRIDINE HCL 200 MG PO TABS: 200.0000 mg | ORAL_TABLET | Freq: Three times a day (TID) | ORAL | 0 refills | Status: DC | PRN

## 2019-11-19 NOTE — Discharge Instructions (Signed)
You have a UTI, these can commonly cause blood in your urine.  Take antibiotics as directed for the next week.  You can also use Azo for the next 2 days to help with dysuria, but you should not take this medication for longer than 2 days while taking antibiotics.  This medication can turn your urine a orange-red color, which is normal.  If you develop fevers, vomiting, flank pain, abdominal pain, are unable to keep down your antibiotics, or develop any other new or concerning symptoms return to the ED otherwise please follow-up with your primary care doctor.

## 2019-11-19 NOTE — ED Triage Notes (Signed)
Patient presents with dysuria, spotting, and lower abdominal cramping. Patient states burning with urination and oliguria.

## 2019-11-19 NOTE — ED Provider Notes (Signed)
[facility] DEPT Provider Note   CSN: [id number] Arrival date & time: 11/19/19  0058     History Chief Complaint  Patient presents with  . Dysuria    Aurorah Schlachter is a 29 y.o. female.  Deisi Salonga is a 29 y.o. female with hx of kidney stones and UTI, who presents to the ED for evaluation of 2 days of dysuria.  She reports that 3 days ago she noticed some suprapubic cramping and spasm, which she usually gets with UTI and then yesterday began having burning with urination and urinary frequency.  She reports she feels like she needs to use the restroom every few minutes but only goes a small amount.  She has noticed that she has passed some blood and a few small blood clots in her urine.  She is not currently on her menstrual cycle and denies any associated vaginal discharge.  Has continued to have some intermittent suprapubic pain but no pain that lateralizes to one side.  She denies any associated flank pain, does have a history of kidney stones but reports this does not feel like prior kidney stones at all.  No associated fevers.  No nausea or vomiting.  Reports she has had UTIs in the past and this feels similar, but she has not had hematuria before.  She has not taken any medications prior to arrival.        Past Medical History:  Diagnosis Date  . History of kidney stones   . Kidney stones   . Kidney stones     There are no problems to display for this patient.   Past Surgical History:  Procedure Laterality Date  . broken leg Right   . EXTRACORPOREAL SHOCK WAVE LITHOTRIPSY Left 09/16/2018   Procedure: EXTRACORPOREAL SHOCK WAVE LITHOTRIPSY ([facility]);  Surgeon: Ceasar Mons, MD;  Location: [facility];  Service: Urology;  Laterality: Left;  . KNEE ARTHROSCOPY Left   . WISDOM TOOTH EXTRACTION       OB History    Gravida  1   Para      Term      Preterm      AB      Living        SAB      TAB      Ectopic      Multiple        Live Births              No family history on file.  Social History   Tobacco Use  . Smoking status: Never Smoker  . Smokeless tobacco: Never Used  Substance Use Topics  . Alcohol use: Not Currently  . Drug use: Never    Home Medications Prior to Admission medications   Medication Sig Start Date End Date Taking? Authorizing Provider  acetaminophen (TYLENOL) 325 MG tablet Take 650 mg by mouth every 6 (six) hours as needed for moderate pain.    [provider]  cephALEXin (KEFLEX) 500 MG capsule Take 1 capsule (500 mg total) by mouth 4 (four) times daily. 09/14/18   Lacretia Leigh, MD  ondansetron (ZOFRAN ODT) 8 MG disintegrating tablet Take 1 tablet (8 mg total) by mouth every 8 (eight) hours as needed for nausea or vomiting. 09/14/18   Lacretia Leigh, MD  oxyCODONE-acetaminophen (PERCOCET/ROXICET) 5-325 MG tablet Take 1-2 tablets by mouth every 4 (four) hours as needed for moderate pain or severe pain. 09/16/18   Ceasar Mons, MD  tamsulosin [facility])  0.4 MG CAPS capsule Take 1 capsule (0.4 mg total) by mouth daily. 09/14/18   Lorre Nick, MD    Allergies    Patient has no known allergies.  Review of Systems   Review of Systems  Constitutional: Negative for chills and fever.  HENT: Negative.   Respiratory: Negative for cough and shortness of breath.   Gastrointestinal: Positive for abdominal pain. Negative for diarrhea, nausea and vomiting.  Genitourinary: Positive for dysuria, frequency and hematuria. Negative for flank pain, vaginal bleeding and vaginal discharge.  Musculoskeletal: Negative for back pain and myalgias.  Skin: Negative for color change and rash.  Neurological: Negative for dizziness, syncope and light-headedness.    Physical Exam Updated Vital Signs BP (!) 149/107   Pulse 68   Temp 97.8 F (36.6 C) (Oral)   Resp 20   SpO2 100%    Physical Exam Vitals and nursing note reviewed.  Constitutional:      General: She is  not in acute distress.    Appearance: Normal appearance. She is well-developed. She is not ill-appearing or diaphoretic.     Comments: Well-appearing and in no distress  HENT:     Head: Normocephalic and atraumatic.  Eyes:     General:        Right eye: No discharge.        Left eye: No discharge.  Cardiovascular:     Rate and Rhythm: Normal rate and regular rhythm.     Heart sounds: Normal heart sounds. No murmur. No friction rub. No gallop.   Pulmonary:     Effort: Pulmonary effort is normal. No respiratory distress.     Breath sounds: Normal breath sounds. No wheezing or rales.     Comments: Respirations equal and unlabored, patient able to speak in full sentences, lungs clear to auscultation bilaterally Abdominal:     General: Bowel sounds are normal. There is no distension.     Palpations: Abdomen is soft. There is no mass.     Tenderness: There is no abdominal tenderness. There is no guarding.     Comments: Abdomen soft, nondistended, nontender to palpation in all quadrants without guarding or peritoneal signs, no CVA tenderness bilaterally  Musculoskeletal:        General: No deformity.     Cervical back: Neck supple.  Skin:    General: Skin is warm and dry.     Capillary Refill: Capillary refill takes less than 2 seconds.  Neurological:     Mental Status: She is alert.     Coordination: Coordination normal.     Comments: Speech is clear, able to follow commands Moves extremities without ataxia, coordination intact  Psychiatric:        Mood and Affect: Mood normal.        Behavior: Behavior normal.     ED Results / Procedures / Treatments   Labs (all labs ordered are listed, but only abnormal results are displayed) Labs Reviewed  URINALYSIS, ROUTINE W REFLEX MICROSCOPIC - Abnormal; Notable for the following components:      Result Value   APPearance HAZY (*)    Hgb urine dipstick LARGE (*)    Ketones, ur 5 (*)    Protein, ur >=300 (*)    RBC / HPF >50 (*)     All other components within normal limits  URINE CULTURE  PREGNANCY, URINE    EKG None  Radiology No results found.  Procedures Procedures (including critical care time)  Medications Ordered in ED Medications  cephALEXin (KEFLEX) capsule 500 mg (500 mg Oral Given 11/19/19 2633)    ED Course  I have reviewed the triage vital signs and the nursing notes.  Pertinent labs & imaging results that were available during my care of the patient were reviewed by me and considered in my medical decision making (see chart for details).    MDM Rules/Calculators/A&P                      Pt has been diagnosed with a UTI. Pt is afebrile, no CVA tenderness, normotensive, and denies N/V. Pt to be dc home with antibiotics and instructions to follow up with PCP if symptoms persist.  Final Clinical Impression(s) / ED Diagnoses Final diagnoses:  Acute cystitis with hematuria    Rx / DC Orders ED Discharge Orders         Ordered    cephALEXin (KEFLEX) 500 MG capsule  3 times daily     11/19/19 0822    phenazopyridine (PYRIDIUM) 200 MG tablet  3 times daily PRN     11/19/19 0823           Dartha Lodge, PA-C 11/19/19 3545    Alvira Monday, MD 11/19/19 2219

## 2019-11-21 LAB — URINE CULTURE: Culture: 30000 — AB

## 2020-01-16 ENCOUNTER — Inpatient Hospital Stay (HOSPITAL_COMMUNITY)
Admission: AD | Admit: 2020-01-16 | Discharge: 2020-01-16 | Disposition: A | Discharge status: Home / Self Care | Payer: Medicaid Other | Attending: Obstetrics and Gynecology | Admitting: Obstetrics and Gynecology

## 2020-01-16 ENCOUNTER — Inpatient Hospital Stay (HOSPITAL_COMMUNITY): Payer: Medicaid Other

## 2020-01-16 ENCOUNTER — Encounter (HOSPITAL_COMMUNITY): Payer: Self-pay | Admitting: Obstetrics and Gynecology

## 2020-01-16 ENCOUNTER — Other Ambulatory Visit: Payer: Self-pay

## 2020-01-16 DIAGNOSIS — R030 Elevated blood-pressure reading, without diagnosis of hypertension: Secondary | ICD-10-CM | POA: Diagnosis not present

## 2020-01-16 DIAGNOSIS — Z3A01 Less than 8 weeks gestation of pregnancy: Secondary | ICD-10-CM

## 2020-01-16 DIAGNOSIS — O26891 Other specified pregnancy related conditions, first trimester: Secondary | ICD-10-CM | POA: Diagnosis not present

## 2020-01-16 DIAGNOSIS — Z79899 Other long term (current) drug therapy: Secondary | ICD-10-CM | POA: Insufficient documentation

## 2020-01-16 DIAGNOSIS — O209 Hemorrhage in early pregnancy, unspecified: Secondary | ICD-10-CM | POA: Diagnosis present

## 2020-01-16 DIAGNOSIS — O3680X0 Pregnancy with inconclusive fetal viability, not applicable or unspecified: Secondary | ICD-10-CM | POA: Diagnosis not present

## 2020-01-16 DIAGNOSIS — O4691 Antepartum hemorrhage, unspecified, first trimester: Secondary | ICD-10-CM

## 2020-01-16 DIAGNOSIS — Z7984 Long term (current) use of oral hypoglycemic drugs: Secondary | ICD-10-CM | POA: Diagnosis not present

## 2020-01-16 DIAGNOSIS — O4690 Antepartum hemorrhage, unspecified, unspecified trimester: Secondary | ICD-10-CM

## 2020-01-16 DIAGNOSIS — Z6790 Unspecified blood type, Rh positive: Secondary | ICD-10-CM

## 2020-01-16 LAB — CBC
HCT: 39.6 % (ref 36.0–46.0)
Hemoglobin: 11.9 g/dL — ABNORMAL LOW (ref 12.0–15.0)
MCH: 21.9 pg — ABNORMAL LOW (ref 26.0–34.0)
MCHC: 30.1 g/dL (ref 30.0–36.0)
MCV: 72.9 fL — ABNORMAL LOW (ref 80.0–100.0)
Platelets: 302 10*3/uL (ref 150–400)
RBC: 5.43 MIL/uL — ABNORMAL HIGH (ref 3.87–5.11)
RDW: 18.8 % — ABNORMAL HIGH (ref 11.5–15.5)
WBC: 10.7 10*3/uL — ABNORMAL HIGH (ref 4.0–10.5)
nRBC: 0 % (ref 0.0–0.2)

## 2020-01-16 LAB — URINALYSIS, ROUTINE W REFLEX MICROSCOPIC
Bilirubin Urine: NEGATIVE
Glucose, UA: NEGATIVE mg/dL
Ketones, ur: NEGATIVE mg/dL
Nitrite: NEGATIVE
Protein, ur: 100 mg/dL — AB
RBC / HPF: >50 RBC/hpf — ABNORMAL HIGH (ref 0–5)
Specific Gravity, Urine: 1.026 (ref 1.005–1.030)
pH: 5 (ref 5.0–8.0)

## 2020-01-16 LAB — COMPREHENSIVE METABOLIC PANEL
ALT: 18 U/L (ref 0–44)
AST: 17 U/L (ref 15–41)
Albumin: 3.6 g/dL (ref 3.5–5.0)
Alkaline Phosphatase: 56 U/L (ref 38–126)
Anion gap: 9 (ref 5–15)
BUN: 9 mg/dL (ref 6–20)
CO2: 24 mmol/L (ref 22–32)
Calcium: 9 mg/dL (ref 8.9–10.3)
Chloride: 105 mmol/L (ref 98–111)
Creatinine, Ser: 0.82 mg/dL (ref 0.44–1.00)
GFR calc Af Amer: >60 mL/min (ref >60)
GFR calc non Af Amer: >60 mL/min (ref >60)
Glucose, Bld: 90 mg/dL (ref 70–99)
Potassium: 4.1 mmol/L (ref 3.5–5.1)
Sodium: 138 mmol/L (ref 135–145)
Total Bilirubin: 0.2 mg/dL — ABNORMAL LOW (ref 0.3–1.2)
Total Protein: 8.2 g/dL — ABNORMAL HIGH (ref 6.5–8.1)

## 2020-01-16 LAB — WET PREP, GENITAL
Clue Cells Wet Prep HPF POC: NONE SEEN
Sperm: NONE SEEN
Trich, Wet Prep: NONE SEEN
Yeast Wet Prep HPF POC: NONE SEEN

## 2020-01-16 LAB — HEMOGLOBIN A1C
Hgb A1c MFr Bld: 5.8 % — ABNORMAL HIGH (ref 4.8–5.6)
Mean Plasma Glucose: 119.76 mg/dL

## 2020-01-16 LAB — HCG, QUANTITATIVE, PREGNANCY: hCG, Beta Chain, Quant, S: 30 m[IU]/mL — ABNORMAL HIGH (ref <5)

## 2020-01-16 LAB — POCT PREGNANCY, URINE: Preg Test, Ur: POSITIVE — AB

## 2020-01-16 MED ORDER — ACETAMINOPHEN 500 MG PO TABS
1000.0000 mg | ORAL_TABLET | Freq: Once | ORAL | Status: AC
  Administered 2020-01-16: 1000 mg via ORAL
  Filled 2020-01-16: qty 2

## 2020-01-16 NOTE — MAU Note (Addendum)
Pt presents to MAU with c/o a small amount of vaginal bleeding that started x 2 days ago. She describes bleeding as bright red with a few clots. She has mild cramping today. Pt had +HPT x 2 weeks ago, LMP-12/03/2019

## 2020-01-16 NOTE — Discharge Instructions (Signed)
AREA FAMILY PRACTICE PHYSICIANS  [place] ([address]) . [facility] o [address] [place], [place] [address] o [phone] o Mon-Fri 8:30-12:30, 1:30-5:00 o Accepting Medicaid . [facility] o [address], [place], [place] [address] o [phone] o Mon-Fri 8:00-5:30 . [facility] o [address] [place], [place] [address] o [phone] o Mon, Tue, Thur, Fri 8:30-5:00, Wed 10:00-7:00 (closed 1-2pm) o Accepting Medicaid . [facility] o [address], [place], [place]  [address] o Phone - [phone]   Fax - [phone]  [place] ([address]) . [facility] o [address] [place], [place] [address] o [phone] o Mon-Fri 8:00-5:00 . [facility]  o [address] [place], [place] [address] o [phone] o Mon-Fri 8:30-5:30, Sat (Oct.-Mar.) 9:00-1:00 o Accepting Medicaid  [place] ([address]) . [facility] o [address], [place], [place] [address] o [phone] o Mon-Fri 8:00-5:00  [place] ([address]) . [facility] [facility] o [address], [place], [place] [address] o [phone] o Mon-Fri 8:00-5:00 . [facility] o [address], [place], [place] [address] o [phone] o Mon-Fri 8:00-5:00 . [facility] o [address] [place], [place] [address] o [phone] o Mon-Fri 8:00-5:00 . [facility] o [address] [place] [address] o [phone] o Mon-Fri 7:30-5:30  [place] ([address]) . [facility] o [address] [place], [place] [address] o [phone] o Mon-Thur 8:00-6:00 o Accepting Medicaid . [facility] o [address] [place], [place] [address] o [phone] o Mon-Thur 7:30-7:30, Fri 7:30-4:30 o Accepting Medicaid . [facility] o [address], [place], [place]  [address] o [phone]   Fax - [phone]  [place] ([address] & [address]) . [facility] o [address] [place], [place] [address] o [phone] o Mon-Fri 7:00-5:00 . [facility] o [address], [place], [place] [address] o [phone] o Mon-Fri 8:00-5:00 o Accepting Medicaid . [facility] o [address], , [place] [address] o [phone] o Mon-Fri 8:00-5:00 o Accepting Medicaid  [place] ([address]) . [facility] [place] o [address] [place], [place] [address] o [phone] o Mon-Fri 8:00-5:00 . [facility] o [address], [place], [place] [address] o [phone] o Mon-Fri 8:00-5:00 o Accepting Medicaid . [facility] o [address], [place], [place] [address] o [phone] o Mon-Fri 8:00-5:30, Sat&Sun by appointment (phones open at 8:30) o Accepting Medicaid  [place] ([address]) . [facility] o [address] [place], [place] [address] o [phone] o Mon-Thur 8:00-7:00, Fri 8:00-5:00, Sat 8:00-12:00, Sun 9:00-12:00 o Accepting Medicaid . [facility] [place] o [address], [place], [place] [address] o [phone] o Mon-Thur 8:00-5:00 o Accepting Medicaid . [facility] o [address] [place], [place] [address] o [phone] o Mon-Fri 8:00-5:30, Sat (Oct.-Mar.) 9:00-1:00 o Accepting Medicaid  [place]  ([address]) .   [facility] o [address], [place], [place] [address] o [phone] o Mon-Fri 8:00-5:00 o Accepting Medicaid   [place] [phone]([address])  [facility] o [address], [place], [place] [address] o [phone] o Mon-Fri 8:00-5:00  [facility] o [address], [place], [place] [address] o [phone] o Mon-Fri 8:00-5:00  [facility] [place] o [address], [place], [place] [address] o [phone] o Mon-Fri 8:00-5:00 o After hours clinic [facility]([address] [place], [place] [address]) [phone] Mon-Fri 5:00-8:00, Sat 12:00-6:00, Sun 10:00-4:00 o Accepting Medicaid  [facility] o [address], [place], [place]  [address] o [phone]   Fax - [phone]  [place] [phone])  [facility] o [address] [place], [place], [place] [address] o [phone] o Mon-Fri 8:00-5:00  [facility] [place] [facility] [place] [facility] o [address], [place], [place] [address] o [phone] o Mon-Thur 8:00-7:00, Fri 8:00-5:00, Sat 8:00-12:00                           Safe Medications in Pregnancy    Acne: Benzoyl Peroxide Salicylic Acid  Backache/Headache: Tylenol: 2 regular strength every 4 hours OR              2 Extra strength every 6 hours  Colds/Coughs/Allergies: Benadryl (alcohol free) 25 mg every 6 hours as needed Breath right strips Claritin Cepacol throat lozenges Chloraseptic throat spray Cold-Eeze- up to three times per day Cough drops, alcohol free Flonase (by prescription only) Guaifenesin Mucinex Robitussin DM (plain only, alcohol free) Saline nasal spray/drops Sudafed (pseudoephedrine) & Actifed ** use only after [redacted] weeks gestation and if you do not have high blood pressure Tylenol Vicks Vaporub Zinc lozenges Zyrtec    Constipation: Colace Ducolax suppositories Fleet enema Glycerin suppositories Metamucil Milk of magnesia Miralax Senokot Smooth move tea  Diarrhea: Kaopectate Imodium A-D  *NO pepto Bismol  Hemorrhoids: Anusol Anusol HC Preparation H Tucks  Indigestion: Tums Maalox Mylanta Zantac  Pepcid  Insomnia: Benadryl (alcohol free) 25mg  every 6 hours as needed Tylenol PM Unisom, no Gelcaps  Leg Cramps: Tums MagGel  Nausea/Vomiting:  Bonine Dramamine Emetrol Ginger extract Sea bands Meclizine  Nausea medication to take during pregnancy:  Unisom (doxylamine succinate 25 mg tablets) Take one tablet daily at bedtime. If symptoms are not adequately controlled, the dose can be increased to a maximum recommended dose of two tablets daily (1/2 tablet in the morning, 1/2 tablet mid-afternoon and one at bedtime). Vitamin B6 100mg  tablets. Take one tablet twice a day (up to 200 mg per day).  Skin Rashes: Aveeno products Benadryl cream or 25mg  every 6 hours as needed Calamine Lotion 1% cortisone cream  Yeast infection: Gyne-lotrimin 7 Monistat 7   **If taking multiple medications, please check labels to avoid duplicating the same active ingredients **take medication as directed on the label ** Do not exceed 4000 mg of tylenol in 24 hours **Do not take medications that contain aspirin or ibuprofen           Ectopic Pregnancy  An ectopic pregnancy is when the fertilized egg attaches (implants) outside the uterus. Most ectopic pregnancies occur in one of the tubes where eggs travel from the ovary to the uterus (fallopian tubes), but the implanting can occur in other locations. In  rare cases, ectopic pregnancies occur on the ovary, intestine, pelvis, abdomen, or cervix. In an ectopic pregnancy, the fertilized egg does not have the ability to develop into a normal, healthy baby. A ruptured ectopic pregnancy is one in which tearing or bursting of a fallopian tube  causes internal bleeding. Often, there is intense lower abdominal pain, and vaginal bleeding sometimes occurs. Having an ectopic pregnancy can be life-threatening. If this dangerous condition is not treated, it can lead to blood loss, shock, or even death. What are the causes? The most common cause of this condition is damage to one of the fallopian tubes. A fallopian tube may be narrowed or blocked, and that keeps the fertilized egg from reaching the uterus. What increases the risk? This condition is more likely to develop in women of childbearing age who have different levels of risk. The levels of risk can be divided into three categories. High risk  You have gone through infertility treatment.  You have had an ectopic pregnancy before.  You have had surgery on the fallopian tubes, or another surgical procedure, such as an abortion.  You have had surgery to have the fallopian tubes tied (tubal ligation).  You have problems or diseases of the fallopian tubes.  You have been exposed to diethylstilbestrol (DES). This medicine was used until 1971, and it had effects on babies whose mothers took the medicine.  You become pregnant while using an IUD (intrauterine device) for birth control. Moderate risk  You have a history of infertility.  You have had an STI (sexually transmitted infection).  You have a history of pelvic inflammatory disease (PID).  You have scarring from endometriosis.  You have multiple sexual partners.  You smoke. Low risk  You have had pelvic surgery.  You use vaginal douches.  You became sexually active before age 69. What are the signs or symptoms? Common symptoms of this condition include normal pregnancy symptoms, such as missing a period, nausea, tiredness, abdominal pain, breast tenderness, and bleeding. However, ectopic pregnancy will have additional symptoms, such as:  Pain with intercourse.  Irregular vaginal bleeding or spotting.  Cramping  or pain on one side or in the lower abdomen.  Fast heartbeat, low blood pressure, and sweating.  Passing out while having a bowel movement. Symptoms of a ruptured ectopic pregnancy and internal bleeding may include:  Sudden, severe pain in the abdomen and pelvis.  Dizziness, weakness, light-headedness, or fainting.  Pain in the shoulder or neck area. How is this diagnosed? This condition is diagnosed by:  A pelvic exam to locate pain or a mass in the abdomen.  A pregnancy test. This blood test checks for the presence as well as the specific level of pregnancy hormone in the bloodstream.  Ultrasound. This is performed if a pregnancy test is positive. In this test, a probe is inserted into the vagina. The probe will detect a fetus, possibly in a location other than the uterus.  Taking a sample of uterus tissue (dilation and curettage, or D&C).  Surgery to perform a visual exam of the inside of the abdomen using a thin, lighted tube that has a tiny camera on the end (laparoscope).  Culdocentesis. This procedure involves inserting a needle at the top of the vagina, behind the uterus. If blood is present in this area, it may indicate that a fallopian tube is torn. How is this treated? This condition is treated with medicine or surgery. Medicine  An injection of a medicine (methotrexate) may be given  to cause the pregnancy tissue to be absorbed. This medicine may save your fallopian tube. It may be given if: ? The diagnosis is made early, with no signs of active bleeding. ? The fallopian tube has not ruptured. ? You are considered to be a good candidate for the medicine. Usually, pregnancy hormone blood levels are checked after methotrexate treatment. This is to be sure that the medicine is effective. It may take 4-6 weeks for the pregnancy to be absorbed. Most pregnancies will be absorbed by 3 weeks. Surgery  A laparoscope may be used to remove the pregnancy tissue.  If severe  internal bleeding occurs, a larger cut (incision) may be made in the lower abdomen (laparotomy) to remove the fetus and placenta. This is done to stop the bleeding.  Part or all of the fallopian tube may be removed (salpingectomy) along with the fetus and placenta. The fallopian tube may also be repaired during the surgery.  In very rare circumstances, removal of the uterus (hysterectomy) may be required.  After surgery, pregnancy hormone testing may be done to be sure that there is no pregnancy tissue left. Whether your treatment is medicine or surgery, you may receive a Rho (D) immune globulin shot to prevent problems with any future pregnancy. This shot may be given if:  You are Rh-negative and the baby's father is Rh-positive.  You are Rh-negative and you do not know the Rh type of the baby's father. Follow these instructions at home:  Rest and limit your activity after the procedure for as long as told by your health care provider.  Until your health care provider says that it is safe: ? Do not lift anything that is heavier than 10 lb (4.5 kg), or the limit that your health care provider tells you. ? Avoid physical exercise and any movement that requires effort (is strenuous).  To help prevent constipation: ? Eat a healthy diet that includes fruits, vegetables, and whole grains. ? Drink 6-8 glasses of water per day. Get help right away if:  You develop worsening pain that is not relieved by medicine.  You have: ? A fever or chills. ? Vaginal bleeding. ? Redness and swelling at the incision site. ? Nausea and vomiting.  You feel dizzy or weak.  You feel light-headed or you faint. This information is not intended to replace advice given to you by your health care provider. Make sure you discuss any questions you have with your health care provider. Document Revised: 06/01/2017 Document Reviewed: 01/19/2016 Elsevier Patient Education  2020 [employer].        Miscarriage A miscarriage is the loss of an unborn baby (fetus) before the 20th week of pregnancy. Most miscarriages happen during the first 3 months of pregnancy. Sometimes, a miscarriage can happen before a woman knows that she is pregnant. Having a miscarriage can be an emotional experience. If you have had a miscarriage, talk with your health care provider about any questions you may have about miscarrying, the grieving process, and your plans for future pregnancy. What are the causes? A miscarriage may be caused by:  Problems with the genes or chromosomes of the fetus. These problems make it impossible for the baby to develop normally. They are often the result of random errors that occur early in the development of the baby, and are not passed from parent to child (not inherited).  Infection of the cervix or uterus.  Conditions that affect hormone balance in the body.  Problems with  the cervix, such as the cervix opening and thinning before pregnancy is at term (cervical insufficiency).  Problems with the uterus. These may include: ? A uterus with an abnormal shape. ? Fibroids in the uterus. ? Congenital abnormalities. These are problems that were present at birth.  Certain medical conditions.  Smoking, drinking alcohol, or using drugs.  Injury (trauma). In many cases, the cause of a miscarriage is not known. What are the signs or symptoms? Symptoms of this condition include:  Vaginal bleeding or spotting, with or without cramps or pain.  Pain or cramping in the abdomen or lower back.  Passing fluid, tissue, or blood clots from the vagina. How is this diagnosed? This condition may be diagnosed based on:  A physical exam.  Ultrasound.  Blood tests.  Urine tests. How is this treated? Treatment for a miscarriage is sometimes not necessary if you naturally pass all the tissue that was in your uterus. If necessary, this condition may be treated  with:  Dilation and curettage (D&C). This is a procedure in which the cervix is stretched open and the lining of the uterus (endometrium) is scraped. This is done only if tissue from the fetus or placenta remains in the body (incomplete miscarriage).  Medicines, such as: ? Antibiotic medicine, to treat infection. ? Medicine to help the body pass any remaining tissue. ? Medicine to reduce (contract) the size of the uterus. These medicines may be given if you have a lot of bleeding. If you have Rh negative blood and your baby was Rh positive, you will need a shot of a medicine called Rh immunoglobulinto protect your future babies from Rh blood problems. "Rh-negative" and "Rh-positive" refer to whether or not the blood has a specific protein found on the surface of red blood cells (Rh factor). Follow these instructions at home: Medicines   Take over-the-counter and prescription medicines only as told by your health care provider.  If you were prescribed antibiotic medicine, take it as told by your health care provider. Do not stop taking the antibiotic even if you start to feel better.  Do not take NSAIDs, such as aspirin and ibuprofen, unless they are approved by your health care provider. These medicines can cause bleeding. Activity  Rest as directed. Ask your health care provider what activities are safe for you.  Have someone help with home and family responsibilities during this time. General instructions  Keep track of the number of sanitary pads you use each day and how soaked (saturated) they are. Write down this information.  Monitor the amount of tissue or blood clots that you pass from your vagina. Save any large amounts of tissue for your health care provider to examine.  Do not use tampons, douche, or have sex until your health care provider approves.  To help you and your partner with the process of grieving, talk with your health care provider or seek counseling.  When  you are ready, meet with your health care provider to discuss any important steps you should take for your health. Also, discuss steps you should take to have a healthy pregnancy in the future.  Keep all follow-up visits as told by your health care provider. This is important. Where to find more information  The American Congress of Obstetricians and Gynecologists: www.acog.org  U.S. Department of Health and Cytogeneticist of Womens Health: http://hoffman.com/ Contact a health care provider if:  You have a fever or chills.  You have a foul smelling vaginal discharge.  You have more bleeding instead of less. Get help right away if:  You have severe cramps or pain in your back or abdomen.  You pass blood clots or tissue from your vagina that is walnut-sized or larger.  You soak more than 1 regular sanitary pad in an hour.  You become light-headed or weak.  You pass out.  You have feelings of sadness that take over your thoughts, or you have thoughts of hurting yourself. Summary  Most miscarriages happen in the first 3 months of pregnancy. Sometimes miscarriage happens before a woman even knows that she is pregnant.  Follow your health care provider's instruction for home care. Keep all follow-up appointments.  To help you and your partner with the process of grieving, talk with your health care provider or seek counseling. This information is not intended to replace advice given to you by your health care provider. Make sure you discuss any questions you have with your health care provider. Document Revised: 10/11/2018 Document Reviewed: 07/25/2016 Elsevier Patient Education  2020 [employer].        First Trimester of Pregnancy The first trimester of pregnancy is from week 1 until the end of week 13 (months 1 through 3). A week after a sperm fertilizes an egg, the egg will implant on the wall of the uterus. This embryo will begin to develop into a baby. Genes  from you and your partner will form the baby. The female genes will determine whether the baby will be a boy or a girl. At 6-8 weeks, the eyes and face will be formed, and the heartbeat can be seen on ultrasound. At the end of 12 weeks, all the baby's organs will be formed. Now that you are pregnant, you will want to do everything you can to have a healthy baby. Two of the most important things are to get good prenatal care and to follow your health care provider's instructions. Prenatal care is all the medical care you receive before the baby's birth. This care will help prevent, find, and treat any problems during the pregnancy and childbirth. Body changes during your first trimester Your body goes through many changes during pregnancy. The changes vary from woman to woman.  You may gain or lose a couple of pounds at first.  You may feel sick to your stomach (nauseous) and you may throw up (vomit). If the vomiting is uncontrollable, call your health care provider.  You may tire easily.  You may develop headaches that can be relieved by medicines. All medicines should be approved by your health care provider.  You may urinate more often. Painful urination may mean you have a bladder infection.  You may develop heartburn as a result of your pregnancy.  You may develop constipation because certain hormones are causing the muscles that push stool through your intestines to slow down.  You may develop hemorrhoids or swollen veins (varicose veins).  Your breasts may begin to grow larger and become tender. Your nipples may stick out more, and the tissue that surrounds them (areola) may become darker.  Your gums may bleed and may be sensitive to brushing and flossing.  Dark spots or blotches (chloasma, mask of pregnancy) may develop on your face. This will likely fade after the baby is born.  Your menstrual periods will stop.  You may have a loss of appetite.  You may develop cravings for  certain kinds of food.  You may have changes in your emotions from day to  day, such as being excited to be pregnant or being concerned that something may go wrong with the pregnancy and baby.  You may have more vivid and strange dreams.  You may have changes in your hair. These can include thickening of your hair, rapid growth, and changes in texture. Some women also have hair loss during or after pregnancy, or hair that feels dry or thin. Your hair will most likely return to normal after your baby is born. What to expect at prenatal visits During a routine prenatal visit:  You will be weighed to make sure you and the baby are growing normally.  Your blood pressure will be taken.  Your abdomen will be measured to track your baby's growth.  The fetal heartbeat will be listened to between weeks 10 and 14 of your pregnancy.  Test results from any previous visits will be discussed. Your health care provider may ask you:  How you are feeling.  If you are feeling the baby move.  If you have had any abnormal symptoms, such as leaking fluid, bleeding, severe headaches, or abdominal cramping.  If you are using any tobacco products, including cigarettes, chewing tobacco, and electronic cigarettes.  If you have any questions. Other tests that may be performed during your first trimester include:  Blood tests to find your blood type and to check for the presence of any previous infections. The tests will also be used to check for low iron levels (anemia) and protein on red blood cells (Rh antibodies). Depending on your risk factors, or if you previously had diabetes during pregnancy, you may have tests to check for high blood sugar that affects pregnant women (gestational diabetes).  Urine tests to check for infections, diabetes, or protein in the urine.  An ultrasound to confirm the proper growth and development of the baby.  Fetal screens for spinal cord problems (spina bifida) and Down  syndrome.  HIV (human immunodeficiency virus) testing. Routine prenatal testing includes screening for HIV, unless you choose not to have this test.  You may need other tests to make sure you and the baby are doing well. Follow these instructions at home: Medicines  Follow your health care provider's instructions regarding medicine use. Specific medicines may be either safe or unsafe to take during pregnancy.  Take a prenatal vitamin that contains at least 600 micrograms (mcg) of folic acid.  If you develop constipation, try taking a stool softener if your health care provider approves. Eating and drinking   Eat a balanced diet that includes fresh fruits and vegetables, whole grains, good sources of protein such as meat, eggs, or tofu, and low-fat dairy. Your health care provider will help you determine the amount of weight gain that is right for you.  Avoid raw meat and uncooked cheese. These carry germs that can cause birth defects in the baby.  Eating four or five small meals rather than three large meals a day may help relieve nausea and vomiting. If you start to feel nauseous, eating a few soda crackers can be helpful. Drinking liquids between meals, instead of during meals, also seems to help ease nausea and vomiting.  Limit foods that are high in fat and processed sugars, such as fried and sweet foods.  To prevent constipation: ? Eat foods that are high in fiber, such as fresh fruits and vegetables, whole grains, and beans. ? Drink enough fluid to keep your urine clear or pale yellow. Activity  Exercise only as directed by your health  care provider. Most women can continue their usual exercise routine during pregnancy. Try to exercise for 30 minutes at least 5 days a week. Exercising will help you: ? Control your weight. ? Stay in shape. ? Be prepared for labor and delivery.  Experiencing pain or cramping in the lower abdomen or lower back is a good sign that you should stop  exercising. Check with your health care provider before continuing with normal exercises.  Try to avoid standing for long periods of time. Move your legs often if you must stand in one place for a long time.  Avoid heavy lifting.  Wear low-heeled shoes and practice good posture.  You may continue to have sex unless your health care provider tells you not to. Relieving pain and discomfort  Wear a good support bra to relieve breast tenderness.  Take warm sitz baths to soothe any pain or discomfort caused by hemorrhoids. Use hemorrhoid cream if your health care provider approves.  Rest with your legs elevated if you have leg cramps or low back pain.  If you develop varicose veins in your legs, wear support hose. Elevate your feet for 15 minutes, 3-4 times a day. Limit salt in your diet. Prenatal care  Schedule your prenatal visits by the twelfth week of pregnancy. They are usually scheduled monthly at first, then more often in the last 2 months before delivery.  Write down your questions. Take them to your prenatal visits.  Keep all your prenatal visits as told by your health care provider. This is important. Safety  Wear your seat belt at all times when driving.  Make a list of emergency phone numbers, including numbers for family, friends, the hospital, and police and fire departments. General instructions  Ask your health care provider for a referral to a local prenatal education class. Begin classes no later than the beginning of month 6 of your pregnancy.  Ask for help if you have counseling or nutritional needs during pregnancy. Your health care provider can offer advice or refer you to specialists for help with various needs.  Do not use hot tubs, steam rooms, or saunas.  Do not douche or use tampons or scented sanitary pads.  Do not cross your legs for long periods of time.  Avoid cat litter boxes and soil used by cats. These carry germs that can cause birth defects in  the baby and possibly loss of the fetus by miscarriage or stillbirth.  Avoid all smoking, herbs, alcohol, and medicines not prescribed by your health care provider. Chemicals in these products affect the formation and growth of the baby.  Do not use any products that contain nicotine or tobacco, such as cigarettes and e-cigarettes. If you need help quitting, ask your health care provider. You may receive counseling support and other resources to help you quit.  Schedule a dentist appointment. At home, brush your teeth with a soft toothbrush and be gentle when you floss. Contact a health care provider if:  You have dizziness.  You have mild pelvic cramps, pelvic pressure, or nagging pain in the abdominal area.  You have persistent nausea, vomiting, or diarrhea.  You have a bad smelling vaginal discharge.  You have pain when you urinate.  You notice increased swelling in your face, hands, legs, or ankles.  You are exposed to fifth disease or chickenpox.  You are exposed to [place] measles (rubella) and have never had it. Get help right away if:  You have a fever.  You are  leaking fluid from your vagina.  You have spotting or bleeding from your vagina.  You have severe abdominal cramping or pain.  You have rapid weight gain or loss.  You vomit blood or material that looks like coffee grounds.  You develop a severe headache.  You have shortness of breath.  You have any kind of trauma, such as from a fall or a car accident. Summary  The first trimester of pregnancy is from week 1 until the end of week 13 (months 1 through 3).  Your body goes through many changes during pregnancy. The changes vary from woman to woman.  You will have routine prenatal visits. During those visits, your health care provider will examine you, discuss any test results you may have, and talk with you about how you are feeling. This information is not intended to replace advice given to you by your  health care provider. Make sure you discuss any questions you have with your health care provider. Document Revised: 06/01/2017 Document Reviewed: 05/31/2016 Elsevier Patient Education  2020 [employer].

## 2020-01-16 NOTE — MAU Provider Note (Signed)
History     CSN: [id number]  Arrival date and time: 01/16/20 1005   First Provider Initiated Contact with Patient 01/16/20 1101      Chief Complaint  Patient presents with  . Vaginal Bleeding  . Abdominal Pain   [name] is a 29 y.o. G2P0010 at [redacted]w[redacted]d who presents to MAU for vaginal bleeding which began 2 days ago and patient reports it was "really light". Patient reports yesterday the bleeding was only present when she wiped and then today it mostly stopped. Patient reports she as wearing a pad for about 4 hours this morning and did not see any bleeding on the pad. Patient reports when she used the bathroom in MAU she saw some spotting on the tissue after wiping.  Patient reports no issues with HTN until this year. Patient endorses weight gain this year.  Passing blood clots? Small per pt Blood soaking clothes? no Lightheaded/dizzy? no Significant pelvic pain or cramping? "cramping a little bit, it's not bad but it's there" Passed any tissue? no Hx ectopic pregnancy? no  Current pregnancy problems? Pt has not yet been seen Blood Type? O Positive Allergies? NKDA Current medications? Metformin for pre-diabetes? Help getting pregnant? Patient unsure Current [facility] & next appt? [facility], next appt 02/02/2020  Pt denies vaginal discharge/odor/itching. Pt denies N/V, abdominal pain, constipation, diarrhea, or urinary problems. Pt denies fever, chills, fatigue, sweating or changes in appetite. Pt denies SOB or chest pain. Pt denies dizziness, HA, light-headedness, weakness.   OB History    Gravida  2   Para      Term      Preterm      AB  1   Living        SAB  1   TAB      Ectopic      Multiple      Live Births              Past Medical History:  Diagnosis Date  . History of kidney stones   . Kidney stones   . Kidney stones     Past Surgical History:  Procedure Laterality Date  . broken leg Right   . EXTRACORPOREAL SHOCK WAVE  LITHOTRIPSY Left 09/16/2018   Procedure: EXTRACORPOREAL SHOCK WAVE LITHOTRIPSY (ESWL);  Surgeon: Rene Paci, MD;  Location: [facility];  Service: Urology;  Laterality: Left;  . KNEE ARTHROSCOPY Left   . WISDOM TOOTH EXTRACTION      History reviewed. No pertinent family history.  Social History   Tobacco Use  . Smoking status: Never Smoker  . Smokeless tobacco: Never Used  Vaping Use  . Vaping Use: Never used  Substance Use Topics  . Alcohol use: Not Currently  . Drug use: Never    Allergies: No Known Allergies  Medications Prior to Admission  Medication Sig Dispense Refill Last Dose  . metFORMIN (GLUCOPHAGE) 500 MG tablet Take 500 mg by mouth 2 (two) times daily with a meal.   Past Month at Unknown time  . acetaminophen (TYLENOL) 325 MG tablet Take 650 mg by mouth every 6 (six) hours as needed for moderate pain.   More than a month at Unknown time  . ondansetron (ZOFRAN ODT) 8 MG disintegrating tablet Take 1 tablet (8 mg total) by mouth every 8 (eight) hours as needed for nausea or vomiting. 20 tablet 0 More than a month at Unknown time  . oxyCODONE-acetaminophen (PERCOCET/ROXICET) 5-325 MG tablet Take 1-2 tablets by mouth every 4 (  four) hours as needed for moderate pain or severe pain. 15 tablet 0 More than a month at Unknown time  . phenazopyridine (PYRIDIUM) 200 MG tablet Take 1 tablet (200 mg total) by mouth 3 (three) times daily as needed for pain. 6 tablet 0 More than a month at Unknown time  . tamsulosin (FLOMAX) 0.4 MG CAPS capsule Take 1 capsule (0.4 mg total) by mouth daily. 30 capsule 0 More than a month at Unknown time    Review of Systems  Constitutional: Negative for chills, diaphoresis, fatigue and fever.  Eyes: Negative for visual disturbance.  Respiratory: Negative for shortness of breath.   Cardiovascular: Negative for chest pain.  Gastrointestinal: Negative for abdominal pain, constipation, diarrhea, nausea and vomiting.  Genitourinary: Positive for  pelvic pain (cramping) and vaginal bleeding. Negative for dysuria, flank pain, frequency, urgency and vaginal discharge.  Neurological: Negative for dizziness, weakness, light-headedness and headaches.   Physical Exam   Blood pressure (!) 144/93, pulse 78, temperature 98.5 F (36.9 C), temperature source Oral, resp. rate 18, height 5\' 6"  (1.676 m), weight 111.6 kg, last menstrual period 12/03/2019, SpO2 99 %, unknown if currently breastfeeding.  Patient Vitals for the past 24 hrs:  BP Temp Temp src Pulse Resp SpO2 Height Weight  01/16/20 1045 (!) 144/93 -- -- 78 -- -- -- --  01/16/20 1025 -- -- -- -- -- -- -- 111.6 kg  01/16/20 1023 (!) 150/98 98.5 F (36.9 C) Oral 94 18 99 % 5\' 6"  (1.676 m) --   Physical Exam Constitutional:      General: She is not in acute distress.    Appearance: She is normal weight. She is not ill-appearing, toxic-appearing or diaphoretic.  HENT:     Head: Normocephalic and atraumatic.  Pulmonary:     Effort: Pulmonary effort is normal.  Abdominal:     General: Abdomen is flat.  Neurological:     Mental Status: She is alert and oriented to person, place, and time.  Psychiatric:        Mood and Affect: Mood normal.        Behavior: Behavior normal.        Thought Content: Thought content normal.        Judgment: Judgment normal.    Results for orders placed or performed during the hospital encounter of 01/16/20 (from the past 24 hour(s))  Pregnancy, urine POC     Status: Abnormal   Collection Time: 01/16/20 10:21 AM  Result Value Ref Range   Preg Test, Ur POSITIVE (A) NEGATIVE  Urinalysis, Routine w reflex microscopic     Status: Abnormal   Collection Time: 01/16/20 10:27 AM  Result Value Ref Range   Color, Urine AMBER (A) YELLOW   APPearance CLOUDY (A) CLEAR   Specific Gravity, Urine 1.026 1.005 - 1.030   pH 5.0 5.0 - 8.0   Glucose, UA NEGATIVE NEGATIVE mg/dL   Hgb urine dipstick LARGE (A) NEGATIVE   Bilirubin Urine NEGATIVE NEGATIVE   Ketones,  ur NEGATIVE NEGATIVE mg/dL   Protein, ur 01/18/20 (A) NEGATIVE mg/dL   Nitrite NEGATIVE NEGATIVE   Leukocytes,Ua MODERATE (A) NEGATIVE   RBC / HPF >50 (H) 0 - 5 RBC/hpf   WBC, UA 21-50 0 - 5 WBC/hpf   Bacteria, UA MANY (A) NONE SEEN   Squamous Epithelial / LPF 6-10 0 - 5   Mucus PRESENT   CBC     Status: Abnormal   Collection Time: 01/16/20 11:14 AM  Result Value Ref Range  WBC 10.7 (H) 4.0 - 10.5 K/uL   RBC 5.43 (H) 3.87 - 5.11 MIL/uL   Hemoglobin 11.9 (L) 12.0 - 15.0 g/dL   HCT 24.2 36 - 46 %   MCV 72.9 (L) 80.0 - 100.0 fL   MCH 21.9 (L) 26.0 - 34.0 pg   MCHC 30.1 30.0 - 36.0 g/dL   RDW 35.3 (H) 61.4 - 43.1 %   Platelets 302 150 - 400 K/uL   nRBC 0.0 0.0 - 0.2 %  Comprehensive metabolic panel     Status: Abnormal   Collection Time: 01/16/20 11:14 AM  Result Value Ref Range   Sodium 138 135 - 145 mmol/L   Potassium 4.1 3.5 - 5.1 mmol/L   Chloride 105 98 - 111 mmol/L   CO2 24 22 - 32 mmol/L   Glucose, Bld 90 70 - 99 mg/dL   BUN 9 6 - 20 mg/dL   Creatinine, Ser 5.40 0.44 - 1.00 mg/dL   Calcium 9.0 8.9 - 08.6 mg/dL   Total Protein 8.2 (H) 6.5 - 8.1 g/dL   Albumin 3.6 3.5 - 5.0 g/dL   AST 17 15 - 41 U/L   ALT 18 0 - 44 U/L   Alkaline Phosphatase 56 38 - 126 U/L   Total Bilirubin 0.2 (L) 0.3 - 1.2 mg/dL   GFR calc non Af Amer >60 >60 mL/min   GFR calc Af Amer >60 >60 mL/min   Anion gap 9 5 - 15  hCG, quantitative, pregnancy     Status: Abnormal   Collection Time: 01/16/20 11:14 AM  Result Value Ref Range   hCG, Beta Chain, Quant, S 30 (H) <5 mIU/mL  Hemoglobin A1c     Status: Abnormal   Collection Time: 01/16/20 11:14 AM  Result Value Ref Range   Hgb A1c MFr Bld 5.8 (H) 4.8 - 5.6 %   Mean Plasma Glucose 119.76 mg/dL  Wet prep, genital     Status: Abnormal   Collection Time: 01/16/20 11:25 AM   Specimen: Vaginal  Result Value Ref Range   Yeast Wet Prep HPF POC NONE SEEN NONE SEEN   Trich, Wet Prep NONE SEEN NONE SEEN   Clue Cells Wet Prep HPF POC NONE SEEN NONE SEEN    WBC, Wet Prep HPF POC MANY (A) NONE SEEN   Sperm NONE SEEN    US OB LESS THAN 14 WEEKS WITH OB TRANSVAGINAL  Result Date: 01/16/2020 CLINICAL DATA:  Vaginal bleeding and cramping for 2 days. Positive pregnancy test. EXAM: OBSTETRIC <14 WK [place] AND TRANSVAGINAL OB US TECHNIQUE: Both transabdominal and transvaginal ultrasound examinations were performed for complete evaluation of the gestation as well as the maternal uterus, adnexal regions, and pelvic cul-de-sac. Transvaginal technique was performed to assess early pregnancy. COMPARISON:  None. FINDINGS: Intrauterine gestational sac: None Maternal uterus/adnexae: Endometrial thickness measures 10 mm. A single tiny less than 1 cm fibroid is seen in the anterior uterine corpus. Both ovaries are normal in appearance. No adnexal mass or abnormal free fluid identified. IMPRESSION: Pregnancy of unknown anatomic location (no intrauterine gestational sac or adnexal mass identified). Differential diagnosis includes recent spontaneous abortion, IUP too early to visualize, and non-visualized ectopic pregnancy. Recommend correlation with serial beta-hCG levels, and follow up US if warranted clinically. Electronically Signed   By: Danae Orleans M.D.   On: 01/16/2020 13:10    MAU Course  Procedures  MDM -r/o ectopic -UA: amber/cloudy/lg hgb/100PRO/mod leuks/many bacteria, sending urine for culture -CBC: no abnormalities requiring treatment in MAU -CMP:  no abnormalities requiring treatment in MAU -HgbA1C: 5.8 -US: PUL, endometrial thickness 10mm, single 1cm fibroid, normal ovaries/adnexa, no free fluid -hCG: 30 -ABO: O Positive -WetPrep: WNL -GC/CT collected -upon provider entering room to discuss results, pt reports cramping has increased and is now 7/10, accepts Tylenol for treatment -after Tylenol, pt reports cramping now 3/10 -pt discharged to home in stable condition  Orders Placed This Encounter  Procedures  . Wet prep, genital    Standing Status:    Standing    Number of Occurrences:   1  . Culture, OB Urine    Standing Status:   Standing    Number of Occurrences:   1  . US OB LESS THAN 14 WEEKS WITH OB TRANSVAGINAL    Standing Status:   Standing    Number of Occurrences:   1    Order Specific Question:   Symptom/Reason for Exam    Answer:   Vaginal bleeding in pregnancy [[id number]]  . Urinalysis, Routine w reflex microscopic    Standing Status:   Standing    Number of Occurrences:   1  . CBC    Standing Status:   Standing    Number of Occurrences:   1  . Comprehensive metabolic panel    Standing Status:   Standing    Number of Occurrences:   1  . hCG, quantitative, pregnancy    Standing Status:   Standing    Number of Occurrences:   1  . Hemoglobin A1c    Standing Status:   Standing    Number of Occurrences:   1  . [facility]    Referral Priority:   Routine    Referral Type:   Consultation    Referral Reason:   Specialty Services Required    Requested Specialty:   Family Medicine    Number of Visits Requested:   1  . Pregnancy, urine POC    Standing Status:   Standing    Number of Occurrences:   1  . Discharge patient    Order Specific Question:   Discharge disposition    Answer:   01-Home or Self Care [1]    Order Specific Question:   Discharge patient date    Answer:   01/16/2020    Meds ordered this encounter  Medications  . acetaminophen (TYLENOL) tablet 1,000 mg    Assessment and Plan   1. Pregnancy of unknown anatomic location   2. Vaginal bleeding in pregnancy   3. Blood type, Rh positive   4. Elevated blood pressure reading in office without diagnosis of hypertension     Allergies as of 01/16/2020   No Known Allergies     Medication List    TAKE these medications   acetaminophen 325 MG tablet Commonly known as: TYLENOL Take 650 mg by mouth every 6 (six) hours as needed for moderate pain.   metFORMIN 500 MG tablet Commonly known as: GLUCOPHAGE Take 500 mg by mouth  2 (two) times daily with a meal.   ondansetron 8 MG disintegrating tablet Commonly known as: Zofran ODT Take 1 tablet (8 mg total) by mouth every 8 (eight) hours as needed for nausea or vomiting.   oxyCODONE-acetaminophen 5-325 MG tablet Commonly known as: PERCOCET/ROXICET Take 1-2 tablets by mouth every 4 (four) hours as needed for moderate pain or severe pain.   phenazopyridine 200 MG tablet Commonly known as: Pyridium Take 1 tablet (200 mg total) by mouth 3 (three) times daily  as needed for pain.   tamsulosin 0.4 MG Caps capsule Commonly known as: FLOMAX Take 1 capsule (0.4 mg total) by mouth daily.       -will call with culture results, if positive -safe meds in pregnancy list given -list of OB providers given -discussed ectopic vs. SAB vs. miscarriage -strict ectopic precautions given -return MAU precautions -f/u on 01/16/2020 at MAU for repeat hCG -pt without previous dx of cHTN, referred to [facility] practice, PCP list given -pt discharged to home in stable condition  Joni Reining E Roland Prine 01/16/2020, 2:44 PM

## 2020-01-17 LAB — CULTURE, OB URINE

## 2020-01-18 ENCOUNTER — Other Ambulatory Visit: Payer: Self-pay

## 2020-01-18 ENCOUNTER — Inpatient Hospital Stay (HOSPITAL_COMMUNITY)
Admission: AD | Admit: 2020-01-18 | Discharge: 2020-01-18 | Disposition: A | Discharge status: Home / Self Care | Payer: Medicaid Other | Attending: Obstetrics & Gynecology | Admitting: Obstetrics & Gynecology

## 2020-01-18 DIAGNOSIS — Z3A01 Less than 8 weeks gestation of pregnancy: Secondary | ICD-10-CM | POA: Insufficient documentation

## 2020-01-18 DIAGNOSIS — O039 Complete or unspecified spontaneous abortion without complication: Secondary | ICD-10-CM | POA: Diagnosis not present

## 2020-01-18 LAB — HCG, QUANTITATIVE, PREGNANCY: hCG, Beta Chain, Quant, S: 11 m[IU]/mL — ABNORMAL HIGH (ref <5)

## 2020-01-18 NOTE — MAU Note (Signed)
Leslie Arroyo is a 29 y.o. at [redacted]w[redacted]d here in MAU reporting: here for follow up hcg. Denies pain but reports she is still bleeding. States bleeding is the same as previous visit, changing a pad every couple of hours.  Onset of complaint: ongoing  Pain score: 0/10  Vitals:   01/18/20 1141  BP: (!) 145/90  Pulse: 76  Resp: 16  Temp: 97.8 F (36.6 C)  SpO2: 98%     Lab orders placed from triage: hcg

## 2020-01-18 NOTE — MAU Provider Note (Signed)
[name]  is a 29 y.o. G2P0010 at [redacted]w[redacted]d who presents to MAU today for follow-up quant hCG after 48 hours. Pt denies cramping, having "normal period like bleeding".  BP (!) 145/90 (BP Location: Right Arm)   Pulse 76   Temp 97.8 F (36.6 C) (Oral)   Resp 16   LMP 12/03/2019   SpO2 98% Comment: room air  CONSTITUTIONAL: Well-developed, well-nourished female in no acute distress.  ENT: External right and left ear normal.  EYES: EOM intact, conjunctivae normal.  MUSCULOSKELETAL: Normal range of motion.  CARDIOVASCULAR: Regular heart rate RESPIRATORY: Normal effort NEUROLOGICAL: Alert and oriented to person, place, and time.  SKIN: Skin is warm and dry. No rash noted. Not diaphoretic. No erythema. No pallor. PSYCH: Normal mood and affect. Normal behavior. Normal judgment and thought content.  Previous hcg on 01/16/20: 30 Today hcg: 10  A: Miscarriage in first trimester  P: Discharge home Bleeding precautions given Patient will go to [facility] for follow up quant in one week, two weeks for SAB follow up. Patient may return to MAU as needed or if her condition were to change or worsen   Bernerd Limbo, CNM 01/18/2020 1:12 PM

## 2020-01-18 NOTE — Discharge Instructions (Signed)
Miscarriage A miscarriage is the loss of an unborn baby (fetus) before the 20th week of pregnancy. Follow these instructions at home: Medicines   Take over-the-counter and prescription medicines only as told by your doctor.  If you were prescribed antibiotic medicine, take it as told by your doctor. Do not stop taking the antibiotic even if you start to feel better.  Do not take NSAIDs unless your doctor says that this is safe for you. NSAIDs include aspirin and ibuprofen. These medicines can cause bleeding. Activity  Rest as directed. Ask your doctor what activities are safe for you.  Have someone help you at home during this time. General instructions  Write down how many pads you use each day and how soaked they are.  Watch the amount of tissue or clumps of blood (blood clots) that you pass from your vagina. Save any large amounts of tissue for your doctor.  Do not use tampons, douche, or have sex until your doctor approves.  To help you and your partner with the process of grieving, talk with your doctor or seek counseling.  When you are ready, meet with your doctor to talk about steps you should take for your health. Also, talk with your doctor about steps to take to have a healthy pregnancy in the future.  Keep all follow-up visits as told by your doctor. This is important. Contact a doctor if:  You have a fever or chills.  You have vaginal discharge that smells bad.  You have more bleeding. Get help right away if:  You have very bad cramps or pain in your back or belly.  You pass clumps of blood that are walnut-sized or larger from your vagina.  You pass tissue that is walnut-sized or larger from your vagina.  You soak more than 1 regular pad in an hour.  You get light-headed or weak.  You faint (pass out).  You have feelings of sadness that do not go away, or you have thoughts of hurting yourself. Summary  A miscarriage is the loss of an unborn baby before  the 20th week of pregnancy.  Follow your doctor's instructions for home care. Keep all follow-up appointments.  To help you and your partner with the process of grieving, talk with your doctor or seek counseling. This information is not intended to replace advice given to you by your health care provider. Make sure you discuss any questions you have with your health care provider. Document Revised: 10/11/2018 Document Reviewed: 07/25/2016 Elsevier Patient Education  2020 Else[employer] Inc.  Recurrent Pregnancy Loss Recurrent pregnancy loss is the loss of two or more pregnancies before 20 weeks of pregnancy (gestation). What are the causes? The most common cause of recurrent pregnancy loss is an abnormal number of chromosomes in the developing baby (fetus). Chromosomes are the structures inside a cell that hold all the genetic material. Chromosome abnormalities can be inherited, but most of them occur by chance. In most cases of recurrent pregnancy loss, a missing or extra chromosome keeps the baby from developing. It may not be possible to identify which chromosome is defective. Other possible causes of recurrent pregnancy loss include:  Being born with an abnormal womb structure (septate uterus).  Having noncancerous growths in your uterus (fibroids or polyps).  Having a disease that causes scarring in your uterus (Asherman syndrome).  Having a disease that causes your blood to clot (antiphospholipid syndrome).  Having a disease that increases bleeding (thrombophilia). What increases the risk? The following factors  following factors may make you more likely to develop this condition:  Being over the age of 35.  Having diabetes.  Having thyroid disease.  Being Obese.  Being a smoker.  Using recreational drugs.  Using too much alcohol or caffeine. What are the signs or symptoms? Symptoms of this condition include:  Bleeding from the vagina.  Passing clots and fetal tissue from the  vagina. How is this diagnosed? This condition is diagnosed with:  A physical exam. This will include a pelvic exam to check the vagina and uterus for possible causes of pregnancy loss.  An ultrasound. This is done: ? To confirm the pregnancy loss. ? To see if the structure of the uterus is normal. ? To check for polyps or fibroids. Sometimes other tests are done, such as:  Blood tests to see if you have a condition that causes recurrent pregnancy loss.  Blood tests to see if your blood clots normally.  Genetic tests of you and your partner. How is this treated? Treatment for this condition depends on the cause of the pregnancy loss. Possible treatments include:  Having your eggs fertilized outside your uterus (in vitro fertilization). By doing this, a health care provider may be able to select eggs without chromosome abnormalities.  Taking a blood thinner to prevent clotting. This may be done if you have antiphospholipid syndrome.  Having surgery to correct the abnormality in your uterus.  Taking medicines to treat underlying causes of pregnancy loss. Follow these instructions at home: Lifestyle  Do not use any products that contain nicotine or tobacco, such as cigarettes and e-cigarettes. If you need help quitting, ask your health care provider.  Do not use drugs.  Eat a balanced diet rich in fresh fruit and vegetables, whole grains, low-fat dairy, and lean meats.  Manage any chronic health conditions, such as diabetes or thyroid disease.  Get 30 minutes of moderate daily exercise.  If you are overweight, ask your health care provider for support and resources to lose weight.  Find ways to manage stress. These include journaling, yoga, or deep breathing. They also include spending time in nature and practicing meditation. General instructions  Take over-the-counter and prescription medicines only as told by your health care provider.  Get support from friends and loved  ones. Unexpected pregnancy loss can be a sad and stressful event.  Consider meeting with a counselor, spiritual leader, or a pregnancy loss support group.  Ask your health care provider about taking a folic acid supplement.  Meet with a genetic counselor as part of genetic testing to understand the results of any testing.  Keep all follow-up visits as told by your health care provider. This is important. Contact a health care provider if:  You have been trying to get pregnant without success.  You are struggling with sadness or depression. Get help right away if:  You have feelings of sadness that take over your thoughts.  You have thoughts of hurting yourself. Summary  The most common cause of recurrent pregnancy loss is an abnormal number of chromosomes in the developing baby (fetus).  Most of the time, recurrent pregnancy loss happens by chance.  Talk to your health care provider if you are trying to get pregnant and have a history of recurrent pregnancy loss. This information is not intended to replace advice given to you by your health care provider. Make sure you discuss any questions you have with your health care provider. Document Revised: 10/11/2018 Document Reviewed: 09/13/2016 Elsevier Patient   2020 Elsevier Inc.  

## 2020-01-19 LAB — GC/CHLAMYDIA PROBE AMP (~~LOC~~) NOT AT ARMC
Chlamydia: NEGATIVE
Comment: NEGATIVE
Comment: NORMAL
Neisseria Gonorrhea: NEGATIVE

## 2020-01-27 ENCOUNTER — Other Ambulatory Visit: Payer: Self-pay | Admitting: General Practice

## 2020-01-27 ENCOUNTER — Other Ambulatory Visit: Payer: Medicaid Other

## 2020-01-27 ENCOUNTER — Other Ambulatory Visit: Payer: Self-pay

## 2020-01-27 DIAGNOSIS — O039 Complete or unspecified spontaneous abortion without complication: Secondary | ICD-10-CM

## 2020-01-28 LAB — BETA HCG QUANT (REF LAB): hCG Quant: <1 m[IU]/mL

## 2020-02-09 ENCOUNTER — Ambulatory Visit: Payer: Medicaid Other | Admitting: Obstetrics and Gynecology

## 2020-02-09 ENCOUNTER — Encounter: Payer: Self-pay | Admitting: Obstetrics and Gynecology

## 2020-11-23 IMAGING — CT CT RENAL STONE PROTOCOL
2 of 4 series · 15 of 46 positions shown, 17 images · non-contrast
Comparison: None.

CLINICAL DATA: Left flank pain, history of kidney stones

EXAM:
CT ABDOMEN AND PELVIS WITHOUT CONTRAST
TECHNIQUE: Multidetector CT imaging of the abdomen and pelvis was performed
following the standard protocol without IV contrast.

[Series 2: axial st · axial · 0.59mm/px · z∈[+961,+1411]mm · 12 of 101 slices shown, 14 images]
[im 6/101  soft-tissue]
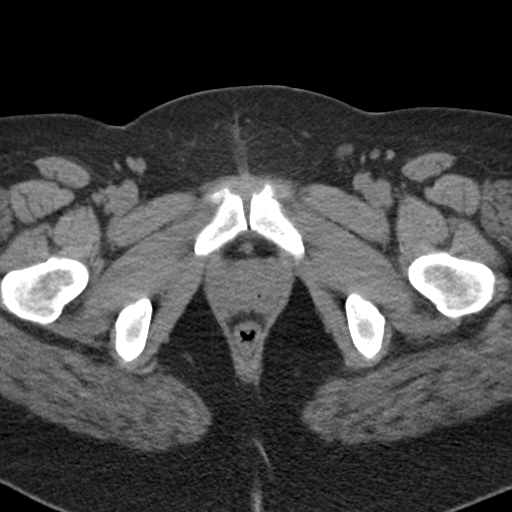
[im 6/101  bone]
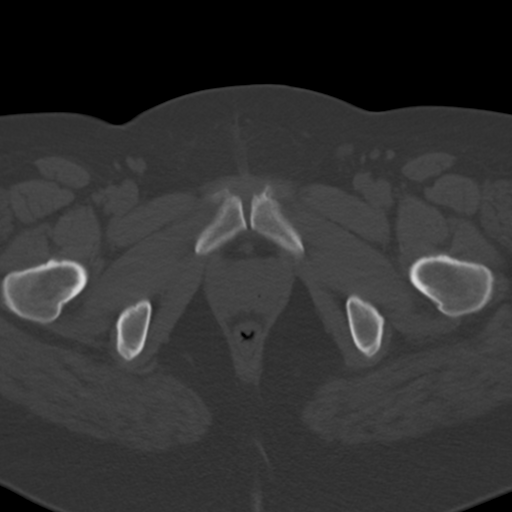
[im 16/101  soft-tissue]
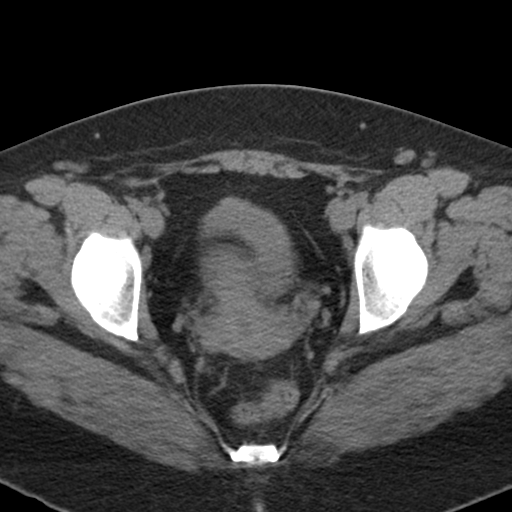
[im 21/101  soft-tissue]
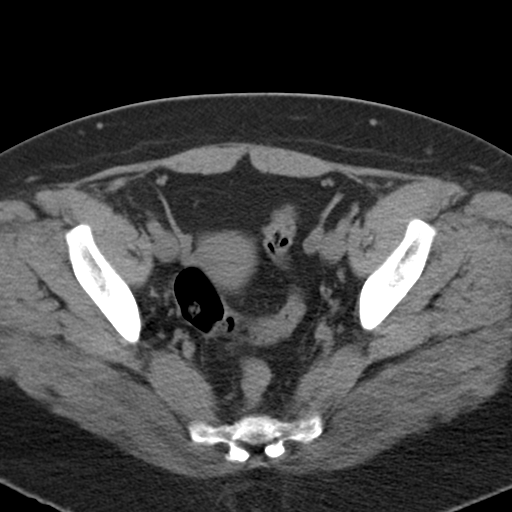
[im 31/101  soft-tissue]
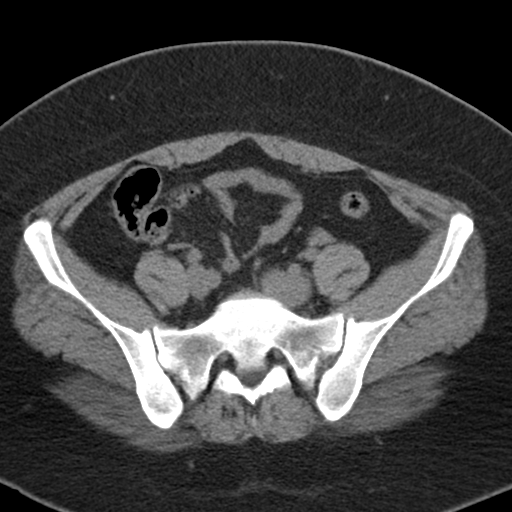
[im 41/101  soft-tissue]
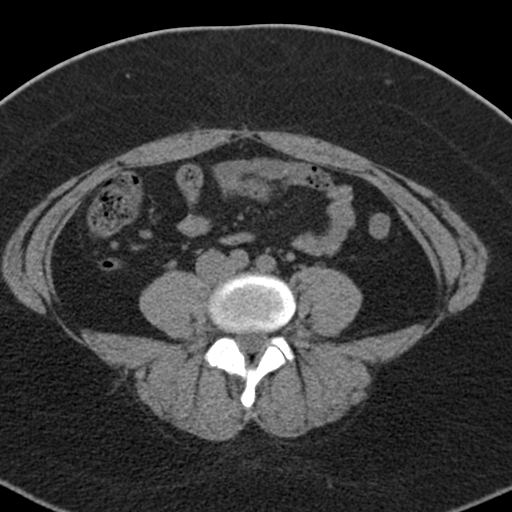
[im 46/101  soft-tissue]
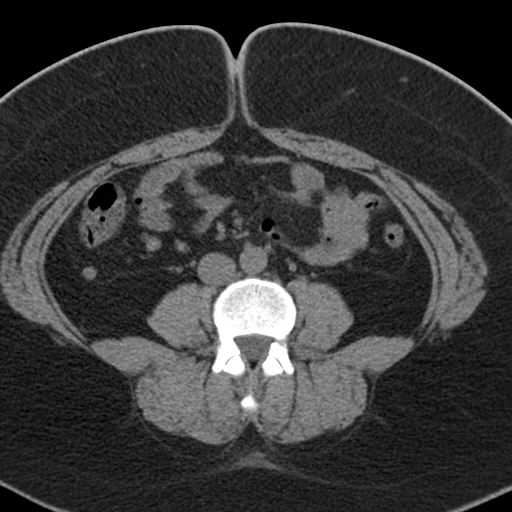
[im 56/101  soft-tissue]
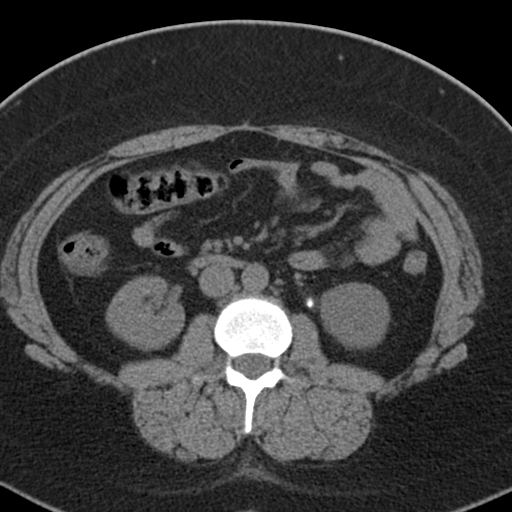
[im 61/101  soft-tissue]
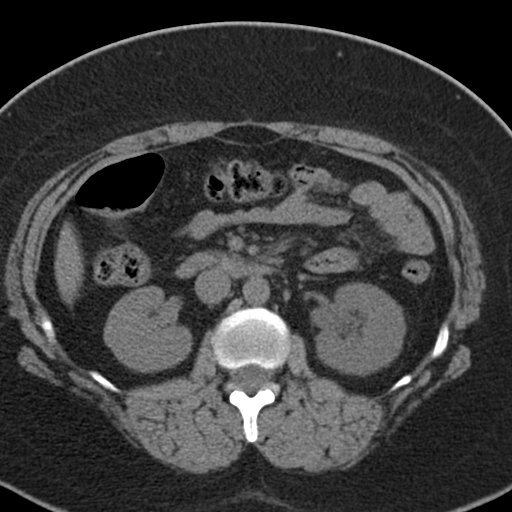
[im 71/101  soft-tissue]
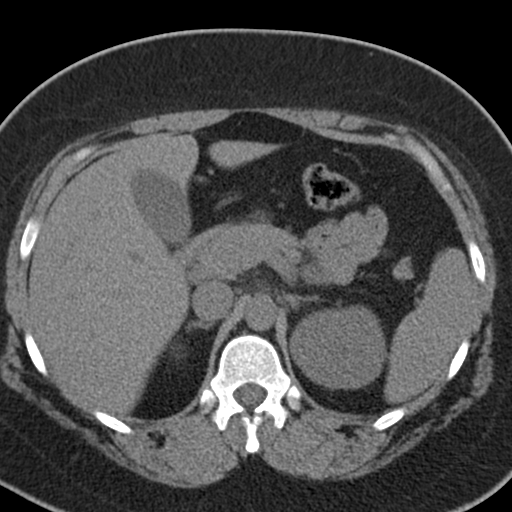
[im 71/101  bone]
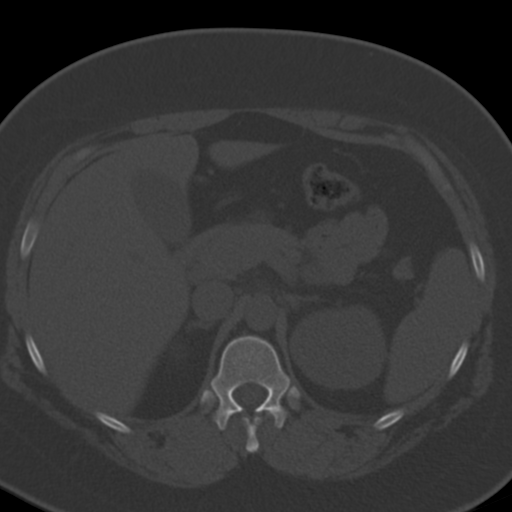
[im 81/101  soft-tissue]
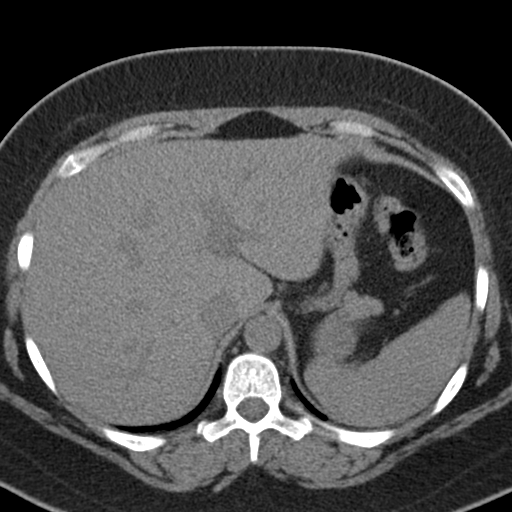
[im 86/101  soft-tissue]
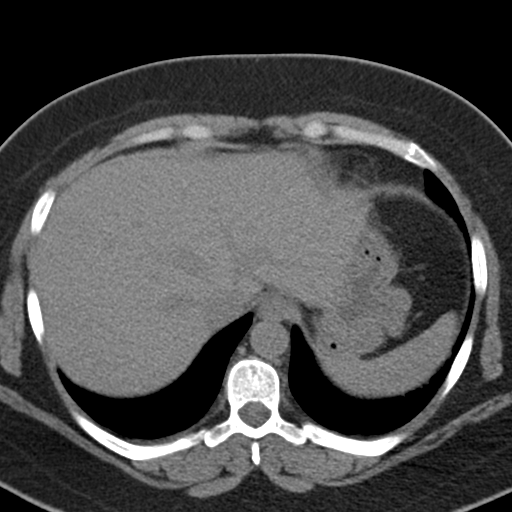
[im 96/101  soft-tissue]
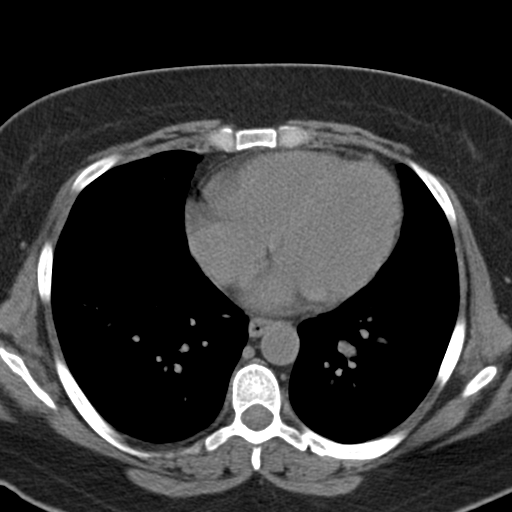

[Series 5: coronal · coronal · 0.63mm/px · 3 of 136 slices shown]
[im 46/136  soft-tissue]
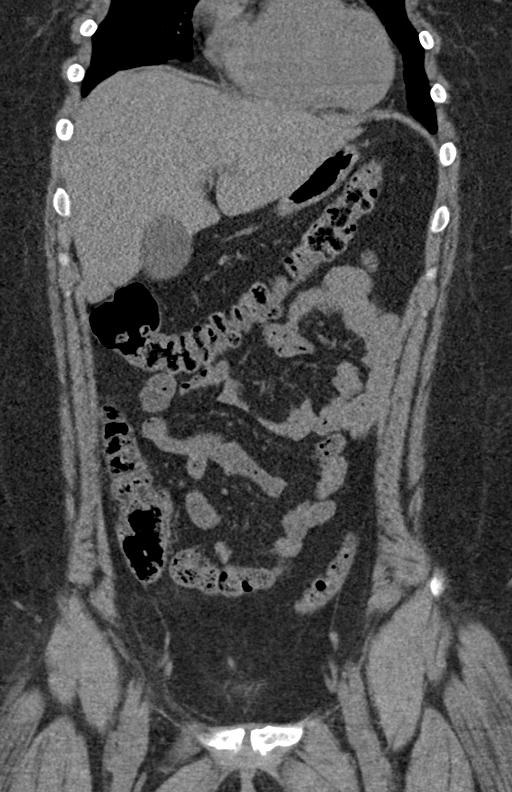
[im 61/136  soft-tissue]
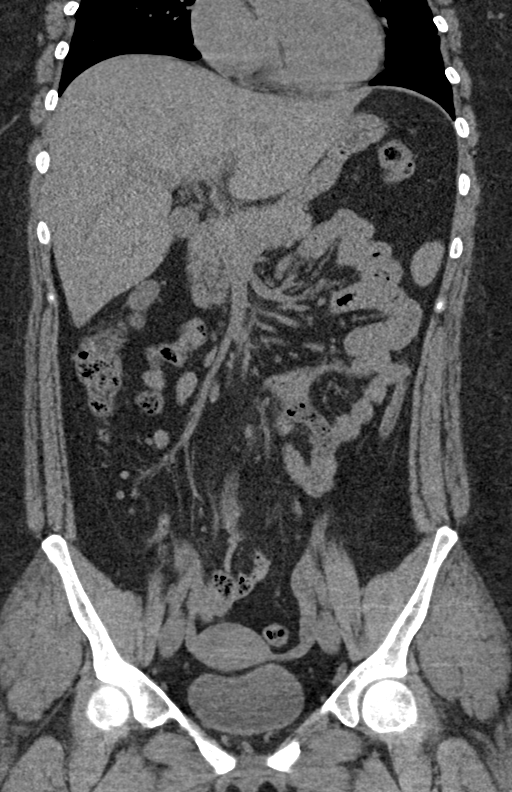
[im 76/136  soft-tissue]
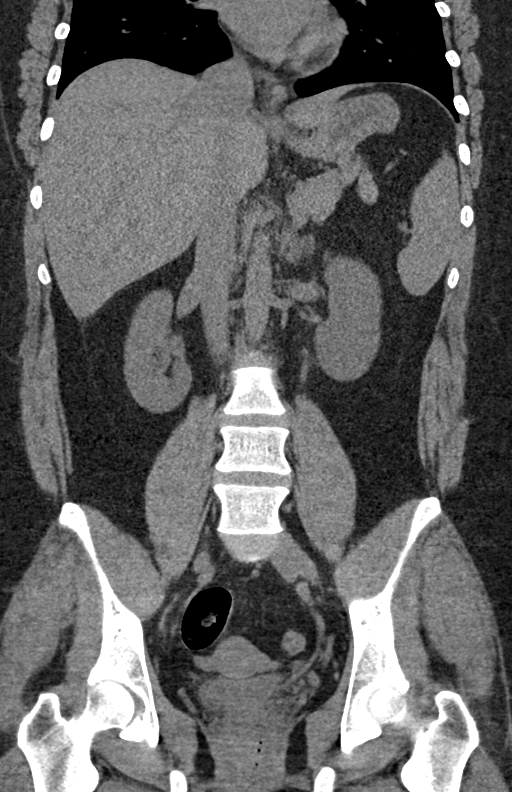

[15 of 46 positions shown; findings below may reference images not displayed]

FINDINGS: Lower chest: Minimal subpleural atelectasis the lung bases.

Hepatobiliary: Unenhanced liver is unremarkable.

Gallbladder sludge. No associated inflammatory changes. No
intrahepatic or extrahepatic ductal dilatation.

Pancreas: Within normal limits.

Spleen: Within normal limits.

Adrenals/Urinary Tract: Adrenal glands are within normal limits.

Right kidney is within normal limits.

3 mm nonobstructing left upper pole renal calculus (series 2/image
32). 7 mm proximal left ureteral calculus at the L2-3 level (coronal
image 79). No hydronephrosis.

Bladder is within normal limits.

Stomach/Bowel: Stomach is within normal limits.

No evidence of bowel obstruction.

Normal appendix (series 2/image 59).

Vascular/Lymphatic: No evidence of abdominal aortic aneurysm.

No suspicious abdominopelvic lymphadenopathy.

Reproductive: Uterus is within normal limits.

Bilateral ovaries are within normal limits.

Other: No abdominopelvic ascites.

Tiny fat containing right inguinal hernia.

Musculoskeletal: Visualized osseous structures are within normal
limits.
IMPRESSION: 7 mm proximal left ureteral calculus.  No hydronephrosis.

Additional 3 mm nonobstructing left upper pole renal calculus.

## 2021-05-30 ENCOUNTER — Other Ambulatory Visit: Payer: Self-pay

## 2021-05-30 ENCOUNTER — Encounter: Payer: Self-pay | Admitting: Obstetrics and Gynecology

## 2021-05-30 ENCOUNTER — Ambulatory Visit (INDEPENDENT_AMBULATORY_CARE_PROVIDER_SITE_OTHER): Payer: BC Managed Care – PPO | Admitting: Obstetrics and Gynecology

## 2021-05-30 VITALS — BP 130/82 | HR 67 | Ht 66.0 in | Wt 252.0 lb

## 2021-05-30 DIAGNOSIS — Z803 Family history of malignant neoplasm of breast: Secondary | ICD-10-CM

## 2021-05-30 DIAGNOSIS — R102 Pelvic and perineal pain: Secondary | ICD-10-CM

## 2021-05-30 DIAGNOSIS — R109 Unspecified abdominal pain: Secondary | ICD-10-CM

## 2021-05-30 DIAGNOSIS — N946 Dysmenorrhea, unspecified: Secondary | ICD-10-CM

## 2021-05-30 DIAGNOSIS — N915 Oligomenorrhea, unspecified: Secondary | ICD-10-CM

## 2021-05-30 DIAGNOSIS — R7303 Prediabetes: Secondary | ICD-10-CM

## 2021-05-30 LAB — PREGNANCY, URINE: Preg Test, Ur: NEGATIVE

## 2021-05-30 MED ORDER — IBUPROFEN 800 MG PO TABS: 800.0000 mg | ORAL_TABLET | Freq: Three times a day (TID) | ORAL | 1 refills | Status: DC | PRN

## 2021-05-30 NOTE — Progress Notes (Signed)
30 y.o. G91P0020 Married White or Caucasian Not Hispanic or Latino female here for a consultation from Edwinna Areola, [place] for abdominal/pelvic pain.  She c/o a several month h/o intermittent suprapubic/LLQ abdominal pelvic pain. The pain ranges from a 2-5/10 in severity, it's crampy, dull, last for a couple of hours. The pain is random, for the last month she has noticed the pain daily. No fever, nausea, emesis, diarrhea, constipation. No bladder c/o. Normal BM daily, no gas. Sexually active, using rhythm, okay if she gets pregnant. She has had 2 losses, 2019 and 2021. She ran out of prenatal vitamins.  She had labs with her primary on 05/18/21. Her labs included: a normal CBC, normal TSH, normal Free T4, elevated hgbA1C. Cervical cultures were done, results not sent.  Typically her cycles are every 28-32 days. About 5 x a years for the last 3 years she will have a longer cycle, ~40-45 days.  Sexually active, same partner x 7 years, married. No dyspareunia. Period Cycle (Days): 28 Period Duration (Days): 5-7 Period Pattern: Regular Menstrual Flow: Moderate Menstrual Control: Other (Comment) (menstral cup) Dysmenorrhea: (!) Severe Dysmenorrhea Symptoms: Cramping  Patient's last menstrual period was 05/24/2021.          Sexually active: Yes.    The current method of family planning is none.    Exercising: Yes.    Gym/ health club routine includes cardio. Smoker:  no  Health Maintenance: Pap: thinks about year ago unsure as to where it was done.  History of abnormal Pap:  no MMG:  none BMD:   none Colonoscopy: none TDaP:  unsure  Gardasil: unsure    reports that she has never smoked. She has never used smokeless tobacco. She reports that she does not currently use alcohol. She reports that she does not use drugs. Social ETOH. 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   Past Medical History:  Diagnosis Date   History of kidney stones    Kidney stones    Kidney stones      Past Surgical History:  Procedure Laterality Date   broken leg Right    EXTRACORPOREAL SHOCK WAVE LITHOTRIPSY Left 09/16/2018   Procedure: EXTRACORPOREAL SHOCK WAVE LITHOTRIPSY (ESWL);  Surgeon: Ceasar Mons, MD;  Location: [facility];  Service: Urology;  Laterality: Left;   KNEE ARTHROSCOPY Left    WISDOM TOOTH EXTRACTION      Current Outpatient Medications  Medication Sig Dispense Refill   acetaminophen (TYLENOL) 325 MG tablet Take 650 mg by mouth every 6 (six) hours as needed for moderate pain.     ibuprofen (ADVIL) 200 MG tablet Take 200 mg by mouth every 6 (six) hours as needed.     No current facility-administered medications for this visit.    Family History  Problem Relation Age of Onset   Cancer Father    Breast cancer Paternal Uncle 55   Cancer Maternal Grandmother   Father had kidney cancer. Uncle had breast cancer.   Review of Systems  All other systems reviewed and are negative.  Exam:   BP 130/82   Pulse 67   Ht 5' 6" (1.676 m)   Wt 252 lb (114.3 kg)   LMP 05/24/2021   SpO2 100%   BMI 40.67 kg/m   Weight change: _0 @ Height:   Height: 5' 6" (167.6 cm)  Ht Readings from Last 3 Encounters:  05/30/21 5' 6" (1.676 m)  01/16/20 5' 6" (1.676 m)  09/16/18 5' 6" (1.676 m)  General appearance: alert, cooperative and appears stated age Head: Normocephalic, without obvious abnormality, atraumatic Neck: no adenopathy, supple, symmetrical, trachea midline and thyroid normal to inspection and palpation Lungs: clear to auscultation bilaterally Cardiovascular: regular rate and rhythm Breasts: normal appearance, no masses or tenderness Abdomen: soft, non-tender; non distended,  no masses,  no organomegaly Extremities: extremities normal, atraumatic, no cyanosis or edema Skin: Skin color, texture, turgor normal. No rashes or lesions Lymph nodes: Cervical, supraclavicular, and axillary nodes normal. No abnormal inguinal nodes  palpated Neurologic: Grossly normal   Pelvic: External genitalia:  no lesions              Urethra:  normal appearing urethra with no masses, tenderness or lesions              Bartholins and Skenes: normal                 Vagina: normal appearing vagina with normal color and discharge, no lesions              Cervix: no cervical motion tenderness and no lesions               Bimanual Exam:  Uterus:   no masses or tenderness              Adnexa: no mass, fullness, tenderness               Rectovaginal: Confirms               Anus:  normal sphincter tone, no lesions  Pelvic floor: not tender  Gae Dry chaperoned for the exam.  1. Combined abdominal and pelvic pain No concerning findings on exam, no bowel or bladder c/o - Pregnancy, urine - US PELVIS TRANSVAGINAL NON-OB (TV ONLY); Future  2. Oligomenorrhea, unspecified type Recent normal TSH - Pregnancy, urine - Prolactin  3. Severe dysmenorrhea Will return for an ultrasound - ibuprofen (ADVIL) 800 MG tablet; Take 1 tablet (800 mg total) by mouth every 8 (eight) hours as needed.  Dispense: 30 tablet; Refill: 1  4. Family history of breast cancer Paternal uncle with breast cancer, no contact with him. Recommended that her father try to find out if he had genetic testing. Her Dad should see a Dietitian, discussed the high risk of BRCA mutations in men with breast cancer  5. Prediabetes On recent blood work with her primary, she will f/u there.   CC: Edwinna Areola, FNP Note sent

## 2021-05-31 ENCOUNTER — Ambulatory Visit (INDEPENDENT_AMBULATORY_CARE_PROVIDER_SITE_OTHER): Payer: BC Managed Care – PPO

## 2021-05-31 DIAGNOSIS — R102 Pelvic and perineal pain: Secondary | ICD-10-CM

## 2021-05-31 DIAGNOSIS — R109 Unspecified abdominal pain: Secondary | ICD-10-CM | POA: Diagnosis not present

## 2021-05-31 LAB — PROLACTIN: Prolactin: 8.1 ng/mL

## 2021-06-03 ENCOUNTER — Encounter: Payer: Self-pay | Admitting: Obstetrics and Gynecology

## 2021-06-03 DIAGNOSIS — N970 Female infertility associated with anovulation: Secondary | ICD-10-CM | POA: Insufficient documentation

## 2021-06-04 ENCOUNTER — Telehealth: Payer: Self-pay | Admitting: Obstetrics and Gynecology

## 2021-06-04 NOTE — Telephone Encounter (Signed)
Mychart message sent.

## 2021-06-20 NOTE — Telephone Encounter (Signed)
My chart message came back unread. I informed patient with message.

## 2022-03-27 IMAGING — US US OB < 14 WEEKS - US OB TV
1 series · 15 of 28 positions shown · non-contrast
Comparison: None.

CLINICAL DATA: Vaginal bleeding and cramping for 2 days. Positive
pregnancy test.

EXAM:
OBSTETRIC <14 WK US AND TRANSVAGINAL OB US
TECHNIQUE: Both transabdominal and transvaginal ultrasound examinations were
performed for complete evaluation of the gestation as well as the
maternal uterus, adnexal regions, and pelvic cul-de-sac.
Transvaginal technique was performed to assess early pregnancy.

[Series 1: us ob < 14 weeks - us ob tv · 15 of 90 slices shown]
[im 1/90]
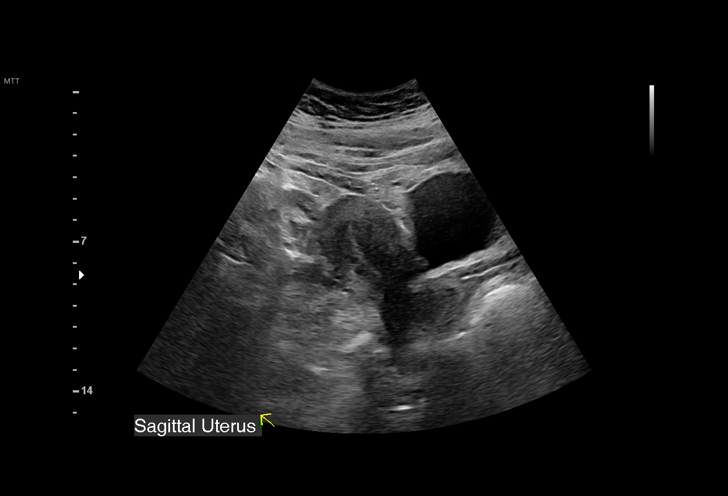
[im 7/90]
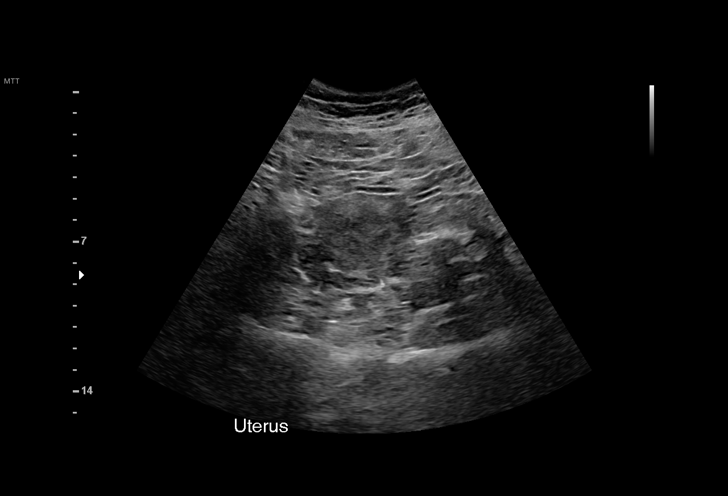
[im 14/90]
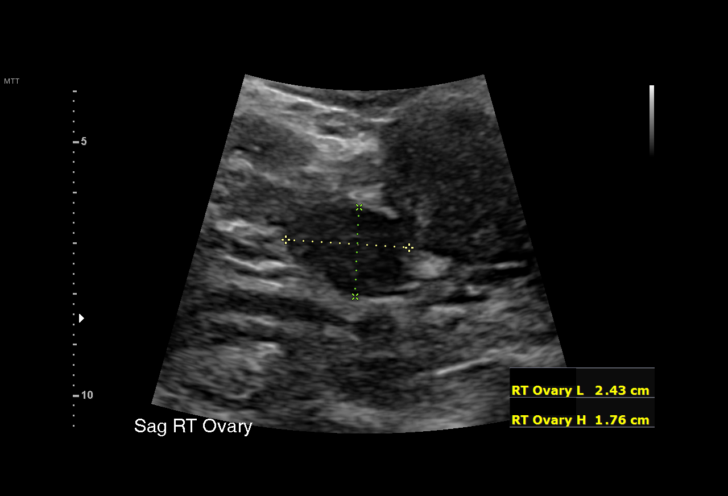
[im 20/90]
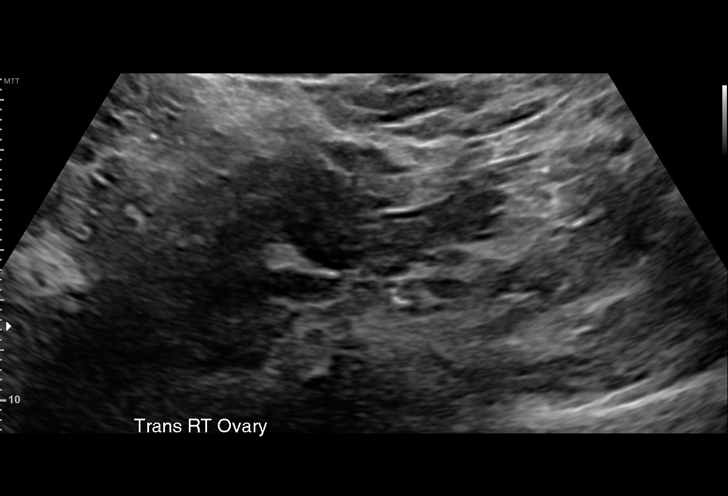
[im 27/90]
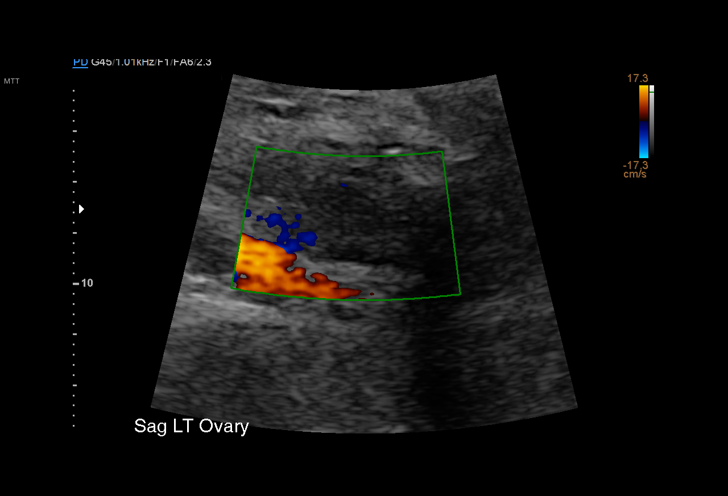
[im 33/90]
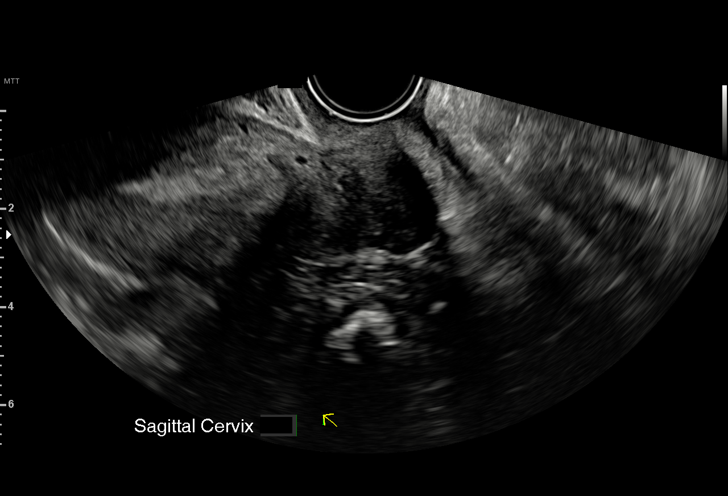
[im 40/90]
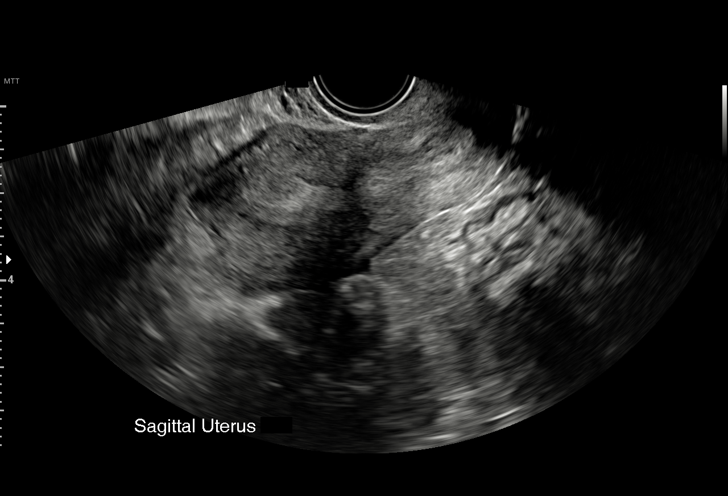
[im 47/90]
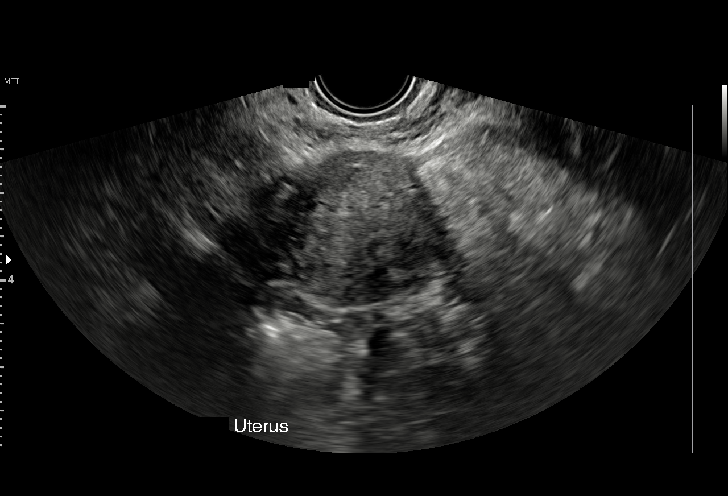
[im 50/90]
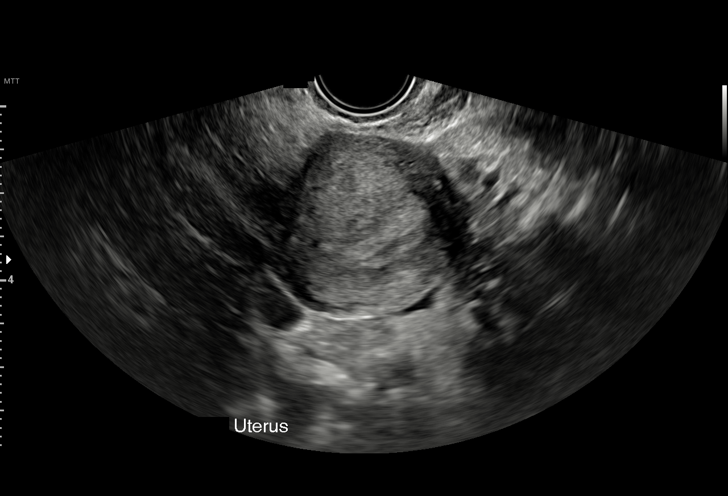
[im 57/90]
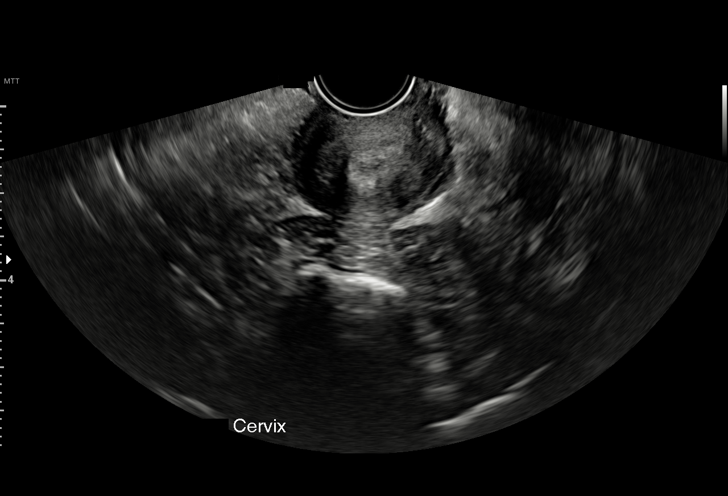
[im 63/90]
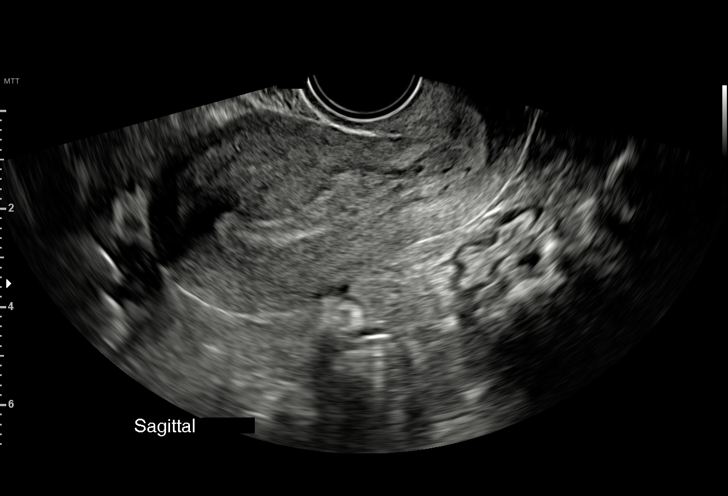
[im 70/90]
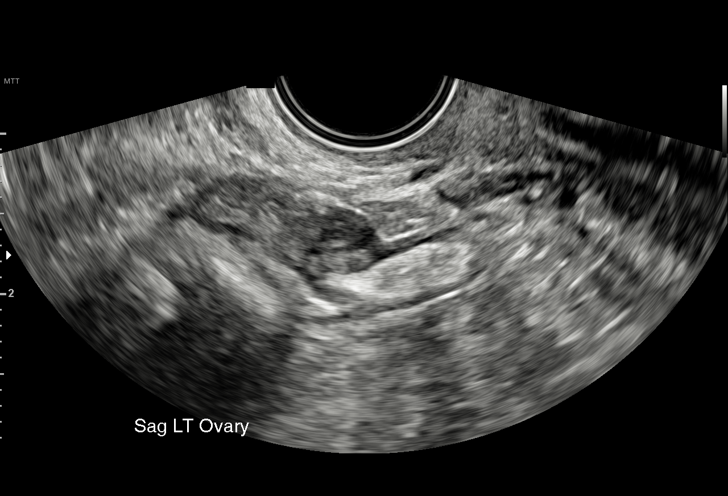
[im 76/90]
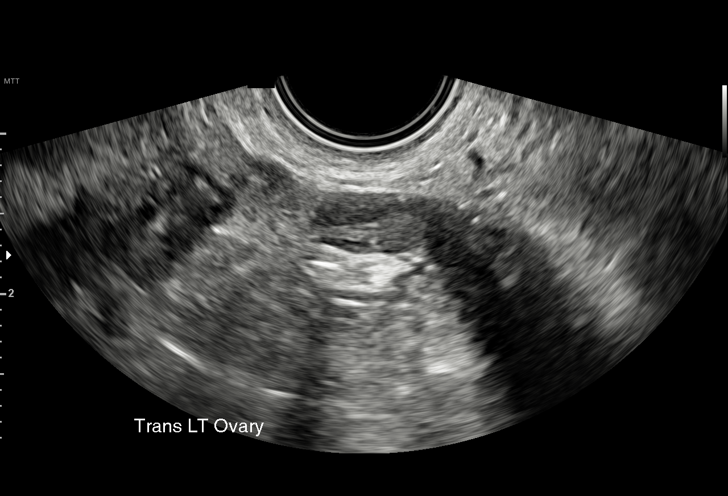
[im 83/90]
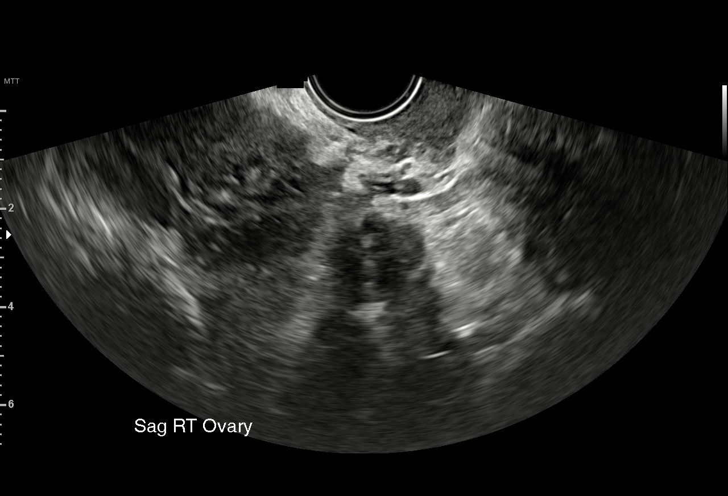
[im 90/90]
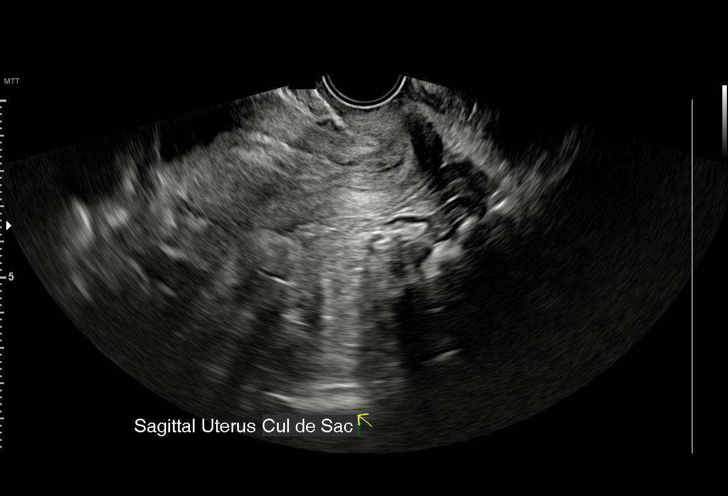

[15 of 28 positions shown; findings below may reference images not displayed]

FINDINGS: Intrauterine gestational sac: None

Maternal uterus/adnexae: Endometrial thickness measures 10 mm. A
single tiny less than 1 cm fibroid is seen in the anterior uterine
corpus. Both ovaries are normal in appearance. No adnexal mass or
abnormal free fluid identified.
IMPRESSION: Pregnancy of unknown anatomic location (no intrauterine gestational
sac or adnexal mass identified). Differential diagnosis includes
recent spontaneous abortion, IUP too early to visualize, and
non-visualized ectopic pregnancy. Recommend correlation with serial
beta-hCG levels, and follow up US if warranted clinically.

## 2022-03-27 NOTE — Progress Notes (Unsigned)
31 y.o. G27P0020 Married White or Caucasian Not Hispanic or Latino female here for annual exam.  Cycles have always been irregular, typically bleeds every 24-40 days. Bleeds x 5-7 days, normal flow. Rare intermenstrual spotting. She has been spotting the last 3 days, mid cycle. Not actively trying to get pregnant, but preventing (since 2015). She has had 2 SAB's, 2019 and 2021. She is on a PNV. Period Duration (Days): 5-7 Period Pattern: (!) Irregular Menstrual Flow: Moderate Menstrual Control: Other (Comment) (Diva Cup) Dysmenorrhea: (!) Moderate Dysmenorrhea Symptoms: Cramping, Headache  Normal prolactin level last year, normal TSH last year.  She has been gaining weight, no change in diet or exercise. Some increase stress, in between jobs.   Typically no dyspareunia, in the last few months she has had some intermittent deep dyspareunia, positional.   She has intermittent swelling, tenderness at the opening on the inner vulva. Feels like she has a blood clot or fluid buildup under the tissues. No vaginal bulge.   She has been having intermittent RUQ abdominal pain for the last couple of months, mild, not associated with eating.   Patient's last menstrual period was 03/14/2022.          Sexually active: Yes.    The current method of family planning is none.    Exercising: Yes.    Gym/ health club routine includes cardio. Smoker:  no  Health Maintenance: Pap:  2021 per patient  History of abnormal Pap:  no MMG:  none  BMD:   none  Colonoscopy: none  TDaP:  2017 Gardasil: unsure, would like to do that.    reports that she has never smoked. She has never used smokeless tobacco. She reports that she does not currently use alcohol. She reports that she does not use drugs. Just occasional ETOH. 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   Past Medical History:  Diagnosis Date   History of kidney stones    Kidney stones    Kidney stones    Prediabetes      Past Surgical History:  Procedure Laterality Date   broken leg Right    EXTRACORPOREAL SHOCK WAVE LITHOTRIPSY Left 09/16/2018   Procedure: EXTRACORPOREAL SHOCK WAVE LITHOTRIPSY (ESWL);  Surgeon: Rene Paci, MD;  Location: [facility];  Service: Urology;  Laterality: Left;   KNEE ARTHROSCOPY Left    WISDOM TOOTH EXTRACTION      Current Outpatient Medications  Medication Sig Dispense Refill   acetaminophen (TYLENOL) 325 MG tablet Take 650 mg by mouth every 6 (six) hours as needed for moderate pain.     ibuprofen (ADVIL) 800 MG tablet Take 1 tablet (800 mg total) by mouth every 8 (eight) hours as needed. 30 tablet 1   metFORMIN (GLUCOPHAGE) 500 MG tablet Take 500 mg by mouth daily.     No current facility-administered medications for this visit.    Family History  Problem Relation Age of Onset   Cancer Father 66       kidney cancer   Breast cancer Paternal Uncle 75   Cancer Maternal Grandmother     Review of Systems  Genitourinary:  Positive for vaginal bleeding.  All other systems reviewed and are negative.   Exam:   BP 128/72   Pulse 66   Ht 5\' 6"  (1.676 m)   Wt 267 lb (121.1 kg)   LMP 03/14/2022   SpO2 100%   BMI 43.09 kg/m   Weight change: @WEIGHTCHANGE @ Height:   Height:  5\' 6"  (167.6 cm)  Ht Readings from Last 3 Encounters:  03/28/22 5\' 6"  (1.676 m)  05/30/21 5\' 6"  (1.676 m)  01/16/20 5\' 6"  (1.676 m)    General appearance: alert, cooperative and appears stated age Head: Normocephalic, without obvious abnormality, atraumatic Neck: no adenopathy, supple, symmetrical, trachea midline and thyroid normal to inspection and palpation Lungs: clear to auscultation bilaterally Cardiovascular: regular rate and rhythm Breasts: normal appearance, no masses or tenderness Abdomen: soft, non-tender; non distended,  no masses,  no organomegaly Extremities: extremities normal, atraumatic, no cyanosis or edema Skin: Skin color, texture, turgor normal. No rashes or  lesions Lymph nodes: Cervical, supraclavicular, and axillary nodes normal. No abnormal inguinal nodes palpated Neurologic: Grossly normal   Pelvic: External genitalia:  no lesions              Urethra:  normal appearing urethra with no masses, tenderness or lesions              Bartholins and Skenes: normal                 Vagina: normal appearing vagina with normal color and discharge, no lesions              Cervix: no lesions and friable with pap               Bimanual Exam:  Uterus:   no masses or tenderness              Adnexa: no mass, fullness, tenderness               Rectovaginal: Confirms               Anus:  normal sphincter tone, no lesions  Gae Dry, CMA chaperoned for the exam.  1. Well woman exam Discussed breast self exam Discussed calcium and vit D intake  2. Irregular periods Life long, normal cycles to oligomenorrhea. Prior normal eval.  - Pregnancy, urine - TSH  3. Prediabetes - Hemoglobin A1c  4. BMI 40.0-44.9, adult (HCC) - Lipid panel - TSH  5. Screening for lipid disorders - Lipid panel  6. Laboratory exam ordered as part of routine general medical examination - Comprehensive metabolic panel  7. Immunization counseling - HPV 9-valent vaccine,Recombinant -Flu shot  8. Screening for cervical cancer - Cytology - PAP  9. RUQ pain Will set her up for a RUQ u/s

## 2022-03-28 ENCOUNTER — Encounter: Payer: Self-pay | Admitting: Obstetrics and Gynecology

## 2022-03-28 ENCOUNTER — Other Ambulatory Visit (HOSPITAL_COMMUNITY)
Admission: RE | Admit: 2022-03-28 | Discharge: 2022-03-28 | Disposition: A | Discharge status: Home / Self Care | Payer: 59 | Source: Ambulatory Visit | Attending: Obstetrics and Gynecology | Admitting: Obstetrics and Gynecology

## 2022-03-28 ENCOUNTER — Ambulatory Visit (INDEPENDENT_AMBULATORY_CARE_PROVIDER_SITE_OTHER): Payer: 59 | Admitting: Obstetrics and Gynecology

## 2022-03-28 VITALS — BP 128/72 | HR 66 | Ht 66.0 in | Wt 267.0 lb

## 2022-03-28 DIAGNOSIS — Z124 Encounter for screening for malignant neoplasm of cervix: Secondary | ICD-10-CM | POA: Diagnosis not present

## 2022-03-28 DIAGNOSIS — Z23 Encounter for immunization: Secondary | ICD-10-CM | POA: Diagnosis not present

## 2022-03-28 DIAGNOSIS — R1011 Right upper quadrant pain: Secondary | ICD-10-CM

## 2022-03-28 DIAGNOSIS — Z01419 Encounter for gynecological examination (general) (routine) without abnormal findings: Secondary | ICD-10-CM | POA: Diagnosis not present

## 2022-03-28 DIAGNOSIS — N926 Irregular menstruation, unspecified: Secondary | ICD-10-CM | POA: Diagnosis not present

## 2022-03-28 DIAGNOSIS — Z6841 Body Mass Index (BMI) 40.0 and over, adult: Secondary | ICD-10-CM

## 2022-03-28 DIAGNOSIS — Z13220 Encounter for screening for lipoid disorders: Secondary | ICD-10-CM

## 2022-03-28 DIAGNOSIS — R7303 Prediabetes: Secondary | ICD-10-CM | POA: Diagnosis not present

## 2022-03-28 DIAGNOSIS — Z7185 Encounter for immunization safety counseling: Secondary | ICD-10-CM

## 2022-03-28 DIAGNOSIS — Z0000 Encounter for general adult medical examination without abnormal findings: Secondary | ICD-10-CM

## 2022-03-28 LAB — PREGNANCY, URINE: Preg Test, Ur: NEGATIVE

## 2022-03-28 NOTE — Patient Instructions (Signed)

## 2022-03-29 ENCOUNTER — Telehealth: Payer: Self-pay | Admitting: *Deleted

## 2022-03-29 DIAGNOSIS — R1011 Right upper quadrant pain: Secondary | ICD-10-CM

## 2022-03-29 LAB — LIPID PANEL
Cholesterol: 190 mg/dL (ref <200)
HDL: 57 mg/dL (ref >=50)
LDL Cholesterol (Calc): 111 mg/dL (calc) — ABNORMAL HIGH
Non-HDL Cholesterol (Calc): 133 mg/dL (calc) — ABNORMAL HIGH (ref <130)
Total CHOL/HDL Ratio: 3.3 (calc) (ref <5.0)
Triglycerides: 108 mg/dL (ref <150)

## 2022-03-29 LAB — COMPREHENSIVE METABOLIC PANEL
AG Ratio: 1.2 (calc) (ref 1.0–2.5)
ALT: 18 U/L (ref 6–29)
AST: 17 U/L (ref 10–30)
Albumin: 4.4 g/dL (ref 3.6–5.1)
Alkaline phosphatase (APISO): 82 U/L (ref 31–125)
BUN/Creatinine Ratio: 12 (calc) (ref 6–22)
BUN: 12 mg/dL (ref 7–25)
CO2: 23 mmol/L (ref 20–32)
Calcium: 9.2 mg/dL (ref 8.6–10.2)
Chloride: 106 mmol/L (ref 98–110)
Creat: 0.98 mg/dL — ABNORMAL HIGH (ref 0.50–0.97)
Globulin: 3.7 g/dL (calc) (ref 1.9–3.7)
Glucose, Bld: 99 mg/dL (ref 65–99)
Potassium: 4 mmol/L (ref 3.5–5.3)
Sodium: 139 mmol/L (ref 135–146)
Total Bilirubin: 0.3 mg/dL (ref 0.2–1.2)
Total Protein: 8.1 g/dL (ref 6.1–8.1)

## 2022-03-29 LAB — HEMOGLOBIN A1C
Hgb A1c MFr Bld: 5.9 % of total Hgb — ABNORMAL HIGH (ref <5.7)
Mean Plasma Glucose: 123 mg/dL
eAG (mmol/L): 6.8 mmol/L

## 2022-03-29 LAB — TSH: TSH: 1.12 mIU/L

## 2022-03-29 NOTE — Telephone Encounter (Signed)
Order placed at [facility] they will call to schedule.

## 2022-03-29 NOTE — Telephone Encounter (Signed)
-----   Message from Salvadore Dom, MD sent at 03/28/2022 11:22 AM EDT ----- Please schedule her for a RUQ U/S for pain

## 2022-03-29 NOTE — Telephone Encounter (Signed)
Patient scheduled on 03/31/22

## 2022-03-31 ENCOUNTER — Other Ambulatory Visit: Payer: Self-pay | Admitting: *Deleted

## 2022-03-31 ENCOUNTER — Ambulatory Visit
Admission: RE | Admit: 2022-03-31 | Discharge: 2022-03-31 | Disposition: A | Discharge status: Home / Self Care | Payer: 59 | Source: Ambulatory Visit | Attending: Obstetrics and Gynecology | Admitting: Obstetrics and Gynecology

## 2022-03-31 DIAGNOSIS — N289 Disorder of kidney and ureter, unspecified: Secondary | ICD-10-CM

## 2022-03-31 DIAGNOSIS — R1011 Right upper quadrant pain: Secondary | ICD-10-CM

## 2022-04-03 LAB — CYTOLOGY - PAP
Comment: NEGATIVE
Diagnosis: NEGATIVE
Diagnosis: REACTIVE
High risk HPV: NEGATIVE

## 2022-04-17 ENCOUNTER — Other Ambulatory Visit (INDEPENDENT_AMBULATORY_CARE_PROVIDER_SITE_OTHER): Payer: 59

## 2022-04-17 DIAGNOSIS — N289 Disorder of kidney and ureter, unspecified: Secondary | ICD-10-CM

## 2022-04-18 LAB — RENAL PROFILE WITH ESTIMATED GFR
Albumin: 3.9 g/dL (ref 3.6–5.1)
BUN: 10 mg/dL (ref 7–25)
CO2: 18 mmol/L — ABNORMAL LOW (ref 20–32)
Calcium: 8.9 mg/dL (ref 8.6–10.2)
Chloride: 106 mmol/L (ref 98–110)
Creat: 0.76 mg/dL (ref 0.50–0.97)
Glucose, Bld: 93 mg/dL (ref 65–99)
Phosphorus: 3.2 mg/dL (ref 2.5–4.5)
Potassium: 4 mmol/L (ref 3.5–5.3)
Sodium: 137 mmol/L (ref 135–146)
eGFR: 107 mL/min/{1.73_m2} (ref >=60)

## 2022-05-03 ENCOUNTER — Ambulatory Visit (INDEPENDENT_AMBULATORY_CARE_PROVIDER_SITE_OTHER): Payer: 59 | Admitting: Nurse Practitioner

## 2022-05-03 ENCOUNTER — Encounter: Payer: Self-pay | Admitting: Nurse Practitioner

## 2022-05-03 VITALS — BP 170/110 | HR 84 | Temp 96.6°F | Ht 65.5 in | Wt 263.8 lb

## 2022-05-03 DIAGNOSIS — R7303 Prediabetes: Secondary | ICD-10-CM | POA: Diagnosis not present

## 2022-05-03 DIAGNOSIS — I10 Essential (primary) hypertension: Secondary | ICD-10-CM

## 2022-05-03 MED ORDER — NIFEDIPINE ER OSMOTIC RELEASE 30 MG PO TB24: 30.0000 mg | ORAL_TABLET | Freq: Every day | ORAL | 1 refills | Status: DC

## 2022-05-03 MED ORDER — METFORMIN HCL ER 500 MG PO TB24: 500.0000 mg | ORAL_TABLET | Freq: Every day | ORAL | 1 refills | Status: DC

## 2022-05-03 NOTE — Assessment & Plan Note (Signed)
BMI 43.2.  Discussed nutrition and exercise.

## 2022-05-03 NOTE — Assessment & Plan Note (Signed)
She has been having elevated blood pressure, even while at home it was 150s-200s/100s.  Today her BP is 170/110.  We will have her start nifedipine 30 mg daily for 7 days and if her blood pressure still greater than 140/90 increase to 60 mg daily.  Recent CMP and CBC reviewed.  Discussed limiting salt in her diet and continuing to check her blood pressure at home.  Follow-up in 2 to 3 weeks.

## 2022-05-03 NOTE — Progress Notes (Signed)
New Patient Visit  BP (!) 170/110 (BP Location: Right Arm, Cuff Size: Large)   Pulse 84   Temp (!) 96.6 F (35.9 C) (Temporal)   Ht 5' 5.5" (1.664 m)   Wt 263 lb 12.8 oz (119.7 kg)   LMP 04/21/2022   SpO2 98%   BMI 43.23 kg/m    Subjective:    Patient ID: Leslie Arroyo, female    DOB: January 29, 1991, 30 y.o.   MRN: [id number]  CC: Chief Complaint  Patient presents with   Establish Care    Np. Est care. Discuss elevated BP.     HPI: Leslie Arroyo is a 31 y.o. female presents for new patient visit to establish care.  Introduced to Designer, jewellery role and practice setting.  All questions answered.  Discussed provider/patient relationship and expectations.  She has been concerned about her blood pressure as it has been elevated at home in the 150-200/100s.  She states that several years ago she went to the doctor with kidney stones and it was elevated then, however he thought it was due to the pain.  She also has had elevated at prior visit for right ankle pain and it was elevated there.  She denies chest pain, shortness of breath, headaches.  She also was told that her sugars are slightly elevated in the prediabetic range.  She has been exercising, doing cardio about twice a week and states that she has been limiting her sugars and carbohydrates.  She eats keto bread if she has a sandwich.  She is interested in medication if there is anything that can help prevent her from getting diabetes.  Depression and anxiety screen done:     05/03/2022   11:06 AM 01/27/2020   11:51 AM 07/01/2018    8:39 AM  Depression screen PHQ 2/9  Decreased Interest 0 0 0  Down, Depressed, Hopeless 1 0 1  PHQ - 2 Score 1 0 1  Altered sleeping 0 0 1  Tired, decreased energy 1 0 3  Change in appetite 0 0 1  Feeling bad or failure about yourself  0 0 0  Trouble concentrating 0 0 0  Moving slowly or fidgety/restless 0 0 0  Suicidal thoughts 0 0 0  PHQ-9 Score 2 0 6  Difficult doing work/chores  Somewhat difficult        05/03/2022   11:07 AM 01/27/2020   11:51 AM 07/01/2018    8:39 AM  GAD 7 : Generalized Anxiety Score  Nervous, Anxious, on Edge 0 0 0  Control/stop worrying 0 0 0  Worry too much - different things 0 0 0  Trouble relaxing 0 0 0  Restless 0 0 0  Easily annoyed or irritable 0 0 0  Afraid - awful might happen 0 0 0  Total GAD 7 Score 0 0 0  Anxiety Difficulty Not difficult at all      Past Medical History:  Diagnosis Date   Kidney stones    Prediabetes     Past Surgical History:  Procedure Laterality Date   broken leg Right    EXTRACORPOREAL SHOCK WAVE LITHOTRIPSY Left 09/16/2018   Procedure: EXTRACORPOREAL SHOCK WAVE LITHOTRIPSY (ESWL);  Surgeon: Ceasar Mons, MD;  Location: [facility];  Service: Urology;  Laterality: Left;   KNEE ARTHROSCOPY Left    WISDOM TOOTH EXTRACTION      Family History  Problem Relation Age of Onset   Hypertension Mother    Diabetes Mother    Cancer Father 33  kidney cancer   Breast cancer Paternal Uncle 37   Hypertension Maternal Grandmother    Diabetes Maternal Grandmother    Cancer Maternal Grandmother        stomach   Hypertension Maternal Grandfather    Diabetes Maternal Grandfather      Social History   Tobacco Use   Smoking status: Never   Smokeless tobacco: Never  Vaping Use   Vaping Use: Never used  Substance Use Topics   Alcohol use: Not Currently   Drug use: Never    No current outpatient medications on file prior to visit.   No current facility-administered medications on file prior to visit.     Review of Systems  Constitutional:  Positive for fatigue. Negative for fever.  HENT: Negative.    Eyes: Negative.   Respiratory: Negative.    Cardiovascular: Negative.   Gastrointestinal: Negative.   Genitourinary: Negative.   Musculoskeletal: Negative.   Skin: Negative.   Neurological: Negative.   Psychiatric/Behavioral:  Positive for dysphoric mood. The patient is not  nervous/anxious.       Objective:    BP (!) 170/110 (BP Location: Right Arm, Cuff Size: Large)   Pulse 84   Temp (!) 96.6 F (35.9 C) (Temporal)   Ht 5' 5.5" (1.664 m)   Wt 263 lb 12.8 oz (119.7 kg)   LMP 04/21/2022   SpO2 98%   BMI 43.23 kg/m   Wt Readings from Last 3 Encounters:  05/03/22 263 lb 12.8 oz (119.7 kg)  03/28/22 267 lb (121.1 kg)  05/30/21 252 lb (114.3 kg)    BP Readings from Last 3 Encounters:  05/03/22 (!) 170/110  03/28/22 128/72  05/30/21 130/82    Physical Exam Vitals and nursing note reviewed.  Constitutional:      General: She is not in acute distress.    Appearance: Normal appearance.  HENT:     Head: Normocephalic and atraumatic.     Right Ear: Tympanic membrane, ear canal and external ear normal.     Left Ear: Tympanic membrane, ear canal and external ear normal.  Eyes:     Conjunctiva/sclera: Conjunctivae normal.  Cardiovascular:     Rate and Rhythm: Normal rate and regular rhythm.     Pulses: Normal pulses.     Heart sounds: Normal heart sounds.  Pulmonary:     Effort: Pulmonary effort is normal.     Breath sounds: Normal breath sounds.  Abdominal:     Palpations: Abdomen is soft.     Tenderness: There is no abdominal tenderness.  Musculoskeletal:        General: Normal range of motion.     Cervical back: Normal range of motion and neck supple.     Right lower leg: No edema.     Left lower leg: No edema.  Lymphadenopathy:     Cervical: No cervical adenopathy.  Skin:    General: Skin is warm and dry.  Neurological:     General: No focal deficit present.     Mental Status: She is alert and oriented to person, place, and time.     Cranial Nerves: No cranial nerve deficit.     Coordination: Coordination normal.     Gait: Gait normal.  Psychiatric:        Mood and Affect: Mood normal.        Behavior: Behavior normal.        Thought Content: Thought content normal.        Judgment: Judgment normal.  Assessment & Plan:    Problem List Items Addressed This Visit       Cardiovascular and Mediastinum   Primary hypertension - Primary    She has been having elevated blood pressure, even while at home it was 150s-200s/100s.  Today her BP is 170/110.  We will have her start nifedipine 30 mg daily for 7 days and if her blood pressure still greater than 140/90 increase to 60 mg daily.  Recent CMP and CBC reviewed.  Discussed limiting salt in her diet and continuing to check her blood pressure at home.  Follow-up in 2 to 3 weeks.      Relevant Medications   NIFEdipine (PROCARDIA-XL/NIFEDICAL-XL) 30 MG 24 hr tablet     Other   Prediabetes    Recent A1c a month ago was 5.9%.  Discussed diet and nutrition.  She is interested in starting medication to help prevent getting diabetes.  We will start her on metformin XR 500 mg daily with food.  Discussed possible side effects.  Follow-up in 3 months.      Morbid obesity (HCC)    BMI 43.2.  Discussed nutrition and exercise.      Relevant Medications   metFORMIN (GLUCOPHAGE-XR) 500 MG 24 hr tablet     Follow up plan: Return in about 3 weeks (around 05/24/2022) for 2-3 weeks , HTN.

## 2022-05-03 NOTE — Assessment & Plan Note (Signed)
Recent A1c a month ago was 5.9%.  Discussed diet and nutrition.  She is interested in starting medication to help prevent getting diabetes.  We will start her on metformin XR 500 mg daily with food.  Discussed possible side effects.  Follow-up in 3 months.

## 2022-05-03 NOTE — Patient Instructions (Addendum)
It was great to see you!  Start nifedipine 1 tablet daily for your blood pressure. Keep checking your blood pressure at home. If after 7 days your blood pressure is still above 140/90 increase to 2 tablets daily.   Try to limit the salt in your diet. Keep checking your blood pressure and writing it down or taking a picture.   Start metformin 1/2 tablet to 1 tablet daily with food.   Let's follow-up in 2-3 weeks, sooner if you have concerns.  If a referral was placed today, you will be contacted for an appointment. Please note that routine referrals can sometimes take up to 3-4 weeks to process. Please call our office if you haven't heard anything after this time frame.  Take care,  Vance Peper, NP

## 2022-05-17 ENCOUNTER — Ambulatory Visit: Payer: 59 | Admitting: Nurse Practitioner

## 2022-05-22 ENCOUNTER — Encounter: Payer: Self-pay | Admitting: Nurse Practitioner

## 2022-05-22 ENCOUNTER — Ambulatory Visit (INDEPENDENT_AMBULATORY_CARE_PROVIDER_SITE_OTHER): Payer: 59 | Admitting: Nurse Practitioner

## 2022-05-22 VITALS — BP 164/98 | HR 83 | Temp 97.7°F | Ht 65.5 in | Wt 258.4 lb

## 2022-05-22 DIAGNOSIS — I10 Essential (primary) hypertension: Secondary | ICD-10-CM | POA: Diagnosis not present

## 2022-05-22 DIAGNOSIS — Z23 Encounter for immunization: Secondary | ICD-10-CM | POA: Diagnosis not present

## 2022-05-22 MED ORDER — NIFEDIPINE ER OSMOTIC RELEASE 60 MG PO TB24: 60.0000 mg | ORAL_TABLET | Freq: Every day | ORAL | 1 refills | Status: DC

## 2022-05-22 NOTE — Progress Notes (Signed)
   Established Patient Office Visit  Subjective   Patient ID: Leslie Arroyo, female    DOB: 06/03/1991  Age: 31 y.o. MRN: [id number]  Chief Complaint  Patient presents with   Follow-up    3 week follow for HTN     HPI  Leslie Arroyo is here to follow-up on hypertension. She was started on nifedipine 30mg  daily last visit. She has been taking this medication without any side effects. She has been checking her blood pressure at home and it ranges from 150-180/100-110s. She denies chest pain, shortness of breath, and headaches. She has been limiting salt in her diet and has lost 5 pounds since her last visit.     ROS See pertinent positives and negatives per HPI.    Objective:     BP (!) 164/98 (BP Location: Right Arm, Cuff Size: Large)   Pulse 83   Temp 97.7 F (36.5 C) (Temporal)   Ht 5' 5.5" (1.664 m)   Wt 258 lb 6.4 oz (117.2 kg)   LMP 04/21/2022   SpO2 99%   BMI 42.35 kg/m  BP Readings from Last 3 Encounters:  05/22/22 (!) 164/98  05/03/22 (!) 170/110  03/28/22 128/72   Wt Readings from Last 3 Encounters:  05/22/22 258 lb 6.4 oz (117.2 kg)  05/03/22 263 lb 12.8 oz (119.7 kg)  03/28/22 267 lb (121.1 kg)    Physical Exam Vitals and nursing note reviewed.  Constitutional:      General: She is not in acute distress.    Appearance: Normal appearance.  HENT:     Head: Normocephalic.  Eyes:     Conjunctiva/sclera: Conjunctivae normal.  Pulmonary:     Effort: Pulmonary effort is normal.  Musculoskeletal:     Cervical back: Normal range of motion.  Skin:    General: Skin is warm.  Neurological:     General: No focal deficit present.     Mental Status: She is alert and oriented to person, place, and time.  Psychiatric:        Mood and Affect: Mood normal.        Behavior: Behavior normal.        Thought Content: Thought content normal.        Judgment: Judgment normal.      Assessment & Plan:   Problem List Items Addressed This Visit        Cardiovascular and Mediastinum   Primary hypertension    Chronic, not controlled. BP today 164/98. Will have her increase the nifedipine to 60mg  daily and if after 7-10 days, BP still elevated above 140/90, will have her increase to 90 mg daily. Keep checking blood pressure. Follow-up in 3 weeks.       Relevant Medications   NIFEdipine (PROCARDIA XL/NIFEDICAL XL) 60 MG 24 hr tablet   Other Visit Diagnoses     Need for HPV vaccination    -  Primary   Relevant Orders   HPV 9-valent vaccine,Recombinat (Completed)       Return in about 3 weeks (around 06/12/2022) for HTN.    , NP

## 2022-05-22 NOTE — Assessment & Plan Note (Signed)
Chronic, not controlled. BP today 164/98. Will have her increase the nifedipine to 60mg  daily and if after 7-10 days, BP still elevated above 140/90, will have her increase to 90 mg daily. Keep checking blood pressure. Follow-up in 3 weeks.

## 2022-05-22 NOTE — Patient Instructions (Signed)
It was great to see you!  Increase your nifedipine to 60mg  daily. Keep checking your blood pressure, if after 7-10 days it still above 140/90, increase to 90mg  daily. You would take a 60mg  tablet and a 30mg  tablet.   Let's follow-up in 3 weeks, sooner if you have concerns.  If a referral was placed today, you will be contacted for an appointment. Please note that routine referrals can sometimes take up to 3-4 weeks to process. Please call our office if you haven't heard anything after this time frame.  Take care,  , NP

## 2022-06-01 ENCOUNTER — Telehealth: Payer: Self-pay | Admitting: Nurse Practitioner

## 2022-06-01 NOTE — Telephone Encounter (Signed)
Caller Name: Trinidee Schrag Call back phone #: [phone]  Reason for Call: Pt is late for her period. She has taken a home test that came back negative and is worried about other hormonal levels. Can orders be placed prior to her appt 12/11 for labs to be drawn.

## 2022-06-12 ENCOUNTER — Ambulatory Visit (INDEPENDENT_AMBULATORY_CARE_PROVIDER_SITE_OTHER): Payer: 59 | Admitting: Nurse Practitioner

## 2022-06-12 ENCOUNTER — Encounter: Payer: Self-pay | Admitting: Nurse Practitioner

## 2022-06-12 VITALS — BP 150/100 | Temp 78.0°F | Ht 65.5 in | Wt 259.0 lb

## 2022-06-12 DIAGNOSIS — R051 Acute cough: Secondary | ICD-10-CM

## 2022-06-12 DIAGNOSIS — I10 Essential (primary) hypertension: Secondary | ICD-10-CM | POA: Diagnosis not present

## 2022-06-12 LAB — HM HEPATITIS C SCREENING LAB: HM Hepatitis Screen: NEGATIVE

## 2022-06-12 LAB — POC COVID19 BINAXNOW: SARS Coronavirus 2 Ag: NEGATIVE

## 2022-06-12 LAB — HM HIV SCREENING LAB: HM HIV Screening: NEGATIVE

## 2022-06-12 MED ORDER — NIFEDIPINE ER OSMOTIC RELEASE 30 MG PO TB24: 30.0000 mg | ORAL_TABLET | Freq: Every day | ORAL | 0 refills | Status: DC

## 2022-06-12 MED ORDER — LABETALOL HCL 100 MG PO TABS: 100.0000 mg | ORAL_TABLET | Freq: Two times a day (BID) | ORAL | 2 refills | Status: DC

## 2022-06-12 NOTE — Progress Notes (Signed)
Established Patient Office Visit  Subjective   Patient ID: Leslie Arroyo, female    DOB: 02-22-1991  Age: 31 y.o. MRN: [id number]  Chief Complaint  Patient presents with   Follow-up    Follow up for htn, having a dry cough, having some heartburn and some headache  this started about 2 weeks ago. Taking otc medication TUMS and Tylenol     HPI  Leslie Arroyo is here to follow up on hypertension.  She has also been experiencing a dry cough for the past 2 days.  She states that she started the nifedipine at 60 mg and went up to 90 mg and noticed that she was having more heartburn and headaches regularly.  She states this started when she got to the 60 mg dose.  Her blood pressure has been 130s to 150s/100s at home.  She denies chest pain and shortness of breath.  She has also been experiencing a dry cough with some nasal congestion for the past 2 days.  She denies fevers, ear pain, sore throat.  She states that her friends were sick recently as well.  She has not taken anything for this at home.    ROS See pertinent positives and negatives per HPI.    Objective:     BP (!) 150/100   Temp (!) 78 F (25.6 C)   Ht 5' 5.5" (1.664 m)   Wt 259 lb (117.5 kg)   SpO2 97%   BMI 42.44 kg/m  BP Readings from Last 3 Encounters:  06/12/22 (!) 150/100  05/22/22 (!) 164/98  05/03/22 (!) 170/110   Wt Readings from Last 3 Encounters:  06/12/22 259 lb (117.5 kg)  05/22/22 258 lb 6.4 oz (117.2 kg)  05/03/22 263 lb 12.8 oz (119.7 kg)      Physical Exam Vitals and nursing note reviewed.  Constitutional:      General: She is not in acute distress.    Appearance: Normal appearance.  HENT:     Head: Normocephalic.  Eyes:     Conjunctiva/sclera: Conjunctivae normal.  Cardiovascular:     Rate and Rhythm: Normal rate and regular rhythm.     Pulses: Normal pulses.     Heart sounds: Normal heart sounds.  Pulmonary:     Effort: Pulmonary effort is normal.     Breath sounds: Normal  breath sounds.  Musculoskeletal:     Cervical back: Normal range of motion.  Skin:    General: Skin is warm.  Neurological:     General: No focal deficit present.     Mental Status: She is alert and oriented to person, place, and time.  Psychiatric:        Mood and Affect: Mood normal.        Behavior: Behavior normal.        Thought Content: Thought content normal.        Judgment: Judgment normal.      Results for orders placed or performed in visit on 06/12/22  [facility] HIV SCREENING LAB  Result Value Ref Range   [facility] HIV Screening Negative - Patient reported   [facility] HEPATITIS C SCREENING LAB  Result Value Ref Range   [facility] Hepatitis Screen Negative - Patient Reported   POC COVID-19 BinaxNow  Result Value Ref Range   SARS Coronavirus 2 Ag Negative Negative      The ASCVD Risk score (Arnett DK, et al., 2019) failed to calculate for the following reasons:   The 2019 ASCVD risk score is  only valid for ages 63 to 73    Assessment & Plan:   Problem List Items Addressed This Visit       Cardiovascular and Mediastinum   Primary hypertension - Primary    Chronic, not controlled.  BP today is 150/100.  When she increased the dose to 60 mg, she did notice an increase in headaches and heartburn.  Will have her decrease the nifedipine back down to 30 mg daily and start her on labetalol 100 mg twice a day.  She can take Prilosec over-the-counter if her heartburn symptoms return.  Follow-up in 3 to 4 weeks.      Relevant Medications   labetalol (NORMODYNE) 100 MG tablet   NIFEdipine (PROCARDIA-XL/NIFEDICAL-XL) 30 MG 24 hr tablet   Other Visit Diagnoses     Acute cough       COVID test negative.  She can take Claritin or use saline nasal spray to help with symptoms.  Follow-up if not improving   Relevant Orders   POC COVID-19 BinaxNow (Completed)       Return in about 4 weeks (around 07/10/2022) for HTN.    Leslie Scull, NP

## 2022-06-12 NOTE — Patient Instructions (Signed)
It was great to see you!  Go back down to nifedipine 30mg  daily.   Start labetalol 1 tablet twice a day  Start claritin (loratadine) 1 tablet daily and/or saline nasal spray to help with the cough and congestion.   Let's follow-up in 4 weeks, sooner if you have concerns.  If a referral was placed today, you will be contacted for an appointment. Please note that routine referrals can sometimes take up to 3-4 weeks to process. Please call our office if you haven't heard anything after this time frame.  Take care,  , NP

## 2022-06-12 NOTE — Assessment & Plan Note (Signed)
Chronic, not controlled.  BP today is 150/100.  When she increased the dose to 60 mg, she did notice an increase in headaches and heartburn.  Will have her decrease the nifedipine back down to 30 mg daily and start her on labetalol 100 mg twice a day.  She can take Prilosec over-the-counter if her heartburn symptoms return.  Follow-up in 3 to 4 weeks.

## 2022-07-10 ENCOUNTER — Ambulatory Visit (INDEPENDENT_AMBULATORY_CARE_PROVIDER_SITE_OTHER): Payer: 59 | Admitting: Nurse Practitioner

## 2022-07-10 ENCOUNTER — Encounter: Payer: Self-pay | Admitting: Nurse Practitioner

## 2022-07-10 VITALS — BP 128/84 | HR 66 | Temp 96.9°F | Ht 65.5 in | Wt 261.8 lb

## 2022-07-10 DIAGNOSIS — K219 Gastro-esophageal reflux disease without esophagitis: Secondary | ICD-10-CM

## 2022-07-10 DIAGNOSIS — I10 Essential (primary) hypertension: Secondary | ICD-10-CM | POA: Diagnosis not present

## 2022-07-10 MED ORDER — LABETALOL HCL 100 MG PO TABS: 100.0000 mg | ORAL_TABLET | Freq: Two times a day (BID) | ORAL | 1 refills | Status: DC

## 2022-07-10 MED ORDER — NIFEDIPINE ER OSMOTIC RELEASE 30 MG PO TB24: 30.0000 mg | ORAL_TABLET | Freq: Every day | ORAL | 0 refills | Status: DC

## 2022-07-10 NOTE — Progress Notes (Signed)
Established Patient Office Visit  Subjective   Patient ID: Leslie Arroyo, female    DOB: Apr 02, 1991  Age: 32 y.o. MRN: [id number]  Chief Complaint  Patient presents with   Follow-up    4 week follow up for htn still having having the heartburn , and ingestion . Haven't checking b/p  , she has been taking the medication.     HPI  Leslie Arroyo is here to follow-up on hypertension. She states that she is not having any side effects from the new medication. Her headaches have decreased again after decreasing the nifedipine to 30mg  daily. She has not been checking her blood pressure at home. She denies chest pain. She has some shortness of breath with periods of exertion or walking up hills.   She is still having intermittent acid reflux. She states that she has been eating a lot of spicy foods recently. She has been taking tums as needed over the counter. She knows this is most likely from what she is eating.     ROS See pertinent positives and negatives per HPI.    Objective:     BP 128/84 (BP Location: Right Arm, Cuff Size: Large)   Pulse 66   Temp (!) 96.9 F (36.1 C)   Ht 5' 5.5" (1.664 m)   Wt 261 lb 12.8 oz (118.8 kg)   LMP 07/07/2022   SpO2 97%   BMI 42.90 kg/m  BP Readings from Last 3 Encounters:  07/10/22 128/84  06/12/22 (!) 150/100  05/22/22 (!) 164/98   Wt Readings from Last 3 Encounters:  07/10/22 261 lb 12.8 oz (118.8 kg)  06/12/22 259 lb (117.5 kg)  05/22/22 258 lb 6.4 oz (117.2 kg)      Physical Exam Vitals and nursing note reviewed.  Constitutional:      General: She is not in acute distress.    Appearance: Normal appearance.  HENT:     Head: Normocephalic.  Eyes:     Conjunctiva/sclera: Conjunctivae normal.  Cardiovascular:     Rate and Rhythm: Normal rate and regular rhythm.     Pulses: Normal pulses.     Heart sounds: Normal heart sounds.  Pulmonary:     Effort: Pulmonary effort is normal.     Breath sounds: Normal breath sounds.   Musculoskeletal:     Cervical back: Normal range of motion.  Skin:    General: Skin is warm.  Neurological:     General: No focal deficit present.     Mental Status: She is alert and oriented to person, place, and time.  Psychiatric:        Mood and Affect: Mood normal.        Behavior: Behavior normal.        Thought Content: Thought content normal.        Judgment: Judgment normal.      Assessment & Plan:   Problem List Items Addressed This Visit       Cardiovascular and Mediastinum   Primary hypertension - Primary    Chronic, stable. BP today 128/84. Will have her continue nifedipine XL 30mg  daily and labetalol 100mg  BID. Refills sent to the pharmacy. Follow-up in 3 months.       Relevant Medications   labetalol (NORMODYNE) 100 MG tablet   NIFEdipine (PROCARDIA-XL/NIFEDICAL-XL) 30 MG 24 hr tablet     Digestive   Gastroesophageal reflux disease    She is having intermittent symptoms. She can take prilosec or nexium daily as needed over the  counter. Discussed diet and limiting spicy foods. Follow-up in 3 months or sooner if symptoms worsen.         Other   Morbid obesity (HCC)    BMI 42.9. Discussed nutrition and exercise.        Return in about 3 months (around 10/09/2022) for CPE.    Gerre Scull, NP

## 2022-07-10 NOTE — Assessment & Plan Note (Signed)
Chronic, stable. BP today 128/84. Will have her continue nifedipine XL 30mg  daily and labetalol 100mg  BID. Refills sent to the pharmacy. Follow-up in 3 months.

## 2022-07-10 NOTE — Assessment & Plan Note (Signed)
She is having intermittent symptoms. She can take prilosec or nexium daily as needed over the counter. Discussed diet and limiting spicy foods. Follow-up in 3 months or sooner if symptoms worsen.

## 2022-07-10 NOTE — Patient Instructions (Signed)
It was great to see you!  I have sent the refills of your blood pressure medication.   Start prilosec or nexium as needed for acid reflux.   Let's follow-up in 3 months, sooner if you have concerns.  If a referral was placed today, you will be contacted for an appointment. Please note that routine referrals can sometimes take up to 3-4 weeks to process. Please call our office if you haven't heard anything after this time frame.  Take care,  Vance Peper, NP

## 2022-07-10 NOTE — Assessment & Plan Note (Signed)
BMI 42.9. Discussed nutrition and exercise.

## 2022-10-14 ENCOUNTER — Encounter (HOSPITAL_COMMUNITY): Payer: Self-pay

## 2022-10-14 ENCOUNTER — Emergency Department (HOSPITAL_COMMUNITY)
Admission: EM | Admit: 2022-10-14 | Discharge: 2022-10-15 | Discharge status: Against Medical Advice | Payer: 59 | Attending: Emergency Medicine | Admitting: Emergency Medicine

## 2022-10-14 DIAGNOSIS — Z5321 Procedure and treatment not carried out due to patient leaving prior to being seen by health care provider: Secondary | ICD-10-CM | POA: Insufficient documentation

## 2022-10-14 DIAGNOSIS — R519 Headache, unspecified: Secondary | ICD-10-CM | POA: Insufficient documentation

## 2022-10-14 DIAGNOSIS — R0981 Nasal congestion: Secondary | ICD-10-CM | POA: Diagnosis not present

## 2022-10-14 DIAGNOSIS — R509 Fever, unspecified: Secondary | ICD-10-CM | POA: Insufficient documentation

## 2022-10-14 DIAGNOSIS — H9202 Otalgia, left ear: Secondary | ICD-10-CM | POA: Insufficient documentation

## 2022-10-14 DIAGNOSIS — Z20822 Contact with and (suspected) exposure to covid-19: Secondary | ICD-10-CM | POA: Diagnosis not present

## 2022-10-14 LAB — SARS CORONAVIRUS 2 BY RT PCR: SARS Coronavirus 2 by RT PCR: NEGATIVE

## 2022-10-14 MED ORDER — ACETAMINOPHEN 500 MG PO TABS
1000.0000 mg | ORAL_TABLET | Freq: Once | ORAL | Status: AC
  Administered 2022-10-14: 1000 mg via ORAL
  Filled 2022-10-14: qty 2

## 2022-10-14 NOTE — ED Triage Notes (Addendum)
Pt arrived POV for concern of fever at home 103 that started PTA, pt reports today started left ear pain, nasal congestion, and headache,  recently dx with strep, has not finished ABX (2 pills left). Denies chest congestion or cough. Pt has not taken OTC Tylenol or IBU for fever.

## 2022-10-16 ENCOUNTER — Encounter: Payer: Self-pay | Admitting: Nurse Practitioner

## 2022-10-16 ENCOUNTER — Ambulatory Visit (INDEPENDENT_AMBULATORY_CARE_PROVIDER_SITE_OTHER): Payer: 59 | Admitting: Nurse Practitioner

## 2022-10-16 VITALS — BP 134/88 | HR 102 | Temp 97.7°F | Ht 65.5 in | Wt 261.4 lb

## 2022-10-16 DIAGNOSIS — Z23 Encounter for immunization: Secondary | ICD-10-CM | POA: Diagnosis not present

## 2022-10-16 DIAGNOSIS — R21 Rash and other nonspecific skin eruption: Secondary | ICD-10-CM | POA: Diagnosis not present

## 2022-10-16 DIAGNOSIS — R5383 Other fatigue: Secondary | ICD-10-CM | POA: Diagnosis not present

## 2022-10-16 LAB — POCT MONO (EPSTEIN BARR VIRUS): Mono, POC: NEGATIVE

## 2022-10-16 NOTE — Patient Instructions (Signed)
It was great to see you!  I have referred you to an allergist for allergy testing.   Let's follow-up if your symptoms worsen or don't improve.   Take care,  Rodman Pickle, NP

## 2022-10-16 NOTE — Progress Notes (Signed)
Acute Office Visit  Subjective:     Patient ID: Leslie Arroyo, female    DOB: Dec 06, 1990, 32 y.o.   MRN: [id number]  Chief Complaint  Patient presents with   Allergic Reaction    Request to get allergy test, HPV Vaccine    HPI Patient is in today for recent URI and strep infection.   She states that 3 weeks ago she went to an urgent care for cold symptoms and was told that she had strep.  She was given amoxicillin to take twice a day.  She states that she ended up getting a rash over her feet, legs, arms that was very itchy.  She then noted that on Saturday she had a fever of 103.  She went to the emergency room, however left before being seen.  She was given some Tylenol and the fever came down.  She has not had any fever symptoms since.  She denies sore throat, ear pain.  She is still having fatigue.  She is interested in getting tested for allergies today.   ROS See pertinent positives and negatives per HPI.     Objective:    BP 134/88 (BP Location: Left Arm)   Pulse (!) 102   Temp 97.7 F (36.5 C)   Ht 5' 5.5" (1.664 m)   Wt 261 lb 6.4 oz (118.6 kg)   LMP 10/05/2022 (Approximate)   SpO2 97%   BMI 42.84 kg/m    Physical Exam Vitals and nursing note reviewed.  Constitutional:      General: She is not in acute distress.    Appearance: Normal appearance.  HENT:     Head: Normocephalic.     Right Ear: Tympanic membrane, ear canal and external ear normal.     Left Ear: Tympanic membrane, ear canal and external ear normal.     Mouth/Throat:     Pharynx: Posterior oropharyngeal erythema present. No oropharyngeal exudate.  Eyes:     Conjunctiva/sclera: Conjunctivae normal.  Cardiovascular:     Rate and Rhythm: Normal rate and regular rhythm.     Pulses: Normal pulses.     Heart sounds: Normal heart sounds.  Pulmonary:     Effort: Pulmonary effort is normal.     Breath sounds: Normal breath sounds.  Musculoskeletal:     Cervical back: Normal range of motion and  neck supple. No tenderness.  Lymphadenopathy:     Cervical: No cervical adenopathy.  Skin:    General: Skin is warm.  Neurological:     General: No focal deficit present.     Mental Status: She is alert and oriented to person, place, and time.  Psychiatric:        Mood and Affect: Mood normal.        Behavior: Behavior normal.        Thought Content: Thought content normal.        Judgment: Judgment normal.     Results for orders placed or performed in visit on 10/16/22  POCT Mono (Epstein Barr Virus)  Result Value Ref Range   Mono, POC Negative Negative        Assessment & Plan:   Problem List Items Addressed This Visit   None Visit Diagnoses     Fatigue, unspecified type    -  Primary   Along with rash with amoxicillin, POC mono checked which was negative. Encourage rest and fluids.   Relevant Orders   POCT Mono (Epstein Barr Virus) (Completed)   Rash  Improving, she can take benadryl as needed for itching. Referral placed to allergist for allergy testing per request.   Relevant Orders   Ambulatory referral to Allergy   Immunization due       3rd HPV vaccine given today. No fevers in 48 hours.   Relevant Orders   HPV 9-valent vaccine,Recombinat (Completed)       No orders of the defined types were placed in this encounter.   No follow-ups on file.  Gerre Scull, NP

## 2022-10-19 ENCOUNTER — Ambulatory Visit: Payer: 59 | Admitting: Nurse Practitioner

## 2022-11-09 ENCOUNTER — Ambulatory Visit (INDEPENDENT_AMBULATORY_CARE_PROVIDER_SITE_OTHER): Payer: 59 | Admitting: Nurse Practitioner

## 2022-11-09 ENCOUNTER — Encounter: Payer: Self-pay | Admitting: Nurse Practitioner

## 2022-11-09 VITALS — BP 144/96 | HR 72 | Temp 97.5°F | Ht 65.5 in | Wt 257.0 lb

## 2022-11-09 DIAGNOSIS — Z331 Pregnant state, incidental: Secondary | ICD-10-CM

## 2022-11-09 DIAGNOSIS — Z3A01 Less than 8 weeks gestation of pregnancy: Secondary | ICD-10-CM | POA: Insufficient documentation

## 2022-11-09 DIAGNOSIS — I10 Essential (primary) hypertension: Secondary | ICD-10-CM

## 2022-11-09 MED ORDER — LABETALOL HCL 200 MG PO TABS: 200.0000 mg | ORAL_TABLET | Freq: Two times a day (BID) | ORAL | 1 refills | Status: DC

## 2022-11-09 MED ORDER — LABETALOL HCL 100 MG PO TABS: 100.0000 mg | ORAL_TABLET | Freq: Two times a day (BID) | ORAL | 1 refills | Status: DC

## 2022-11-09 NOTE — Assessment & Plan Note (Signed)
Chronic, not controlled.  BP elevated at 144/96.  Will increase labetalol to 200 mg twice a day and continue nifedipine XL 30 mg daily.  Continue checking blood pressure at home.  Follow-up in 4 weeks.

## 2022-11-09 NOTE — Progress Notes (Signed)
Acute Office Visit  Subjective:     Patient ID: Leslie Arroyo, female    DOB: 1990/10/17, 32 y.o.   MRN: [id number]  Chief Complaint  Patient presents with   Positive Pregnancy Test    Wants to have lab work done and questions if its ok to continue current medication while pregnant    HPI Patient is in today for positive home pregnancy test.  Her last menstrual period ended on 10/09/2022.  She states that she took a pregnancy test because she was a few days late for her menstrual period and it came back positive.  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  She denies nausea, breast tenderness.  She would like to have blood levels done of her pregnancy hormones.  She also notes that her blood pressure at home has been running in the 140s/90s.  She has been taking her blood pressure medication as prescribed.  She denies chest pain, shortness of breath.  She wants to make sure that is okay to continue these medications.   ROS See pertinent positives and negatives per HPI.     Objective:    BP (!) 144/96 (BP Location: Left Arm, Cuff Size: Large)   Pulse 72   Temp (!) 97.5 F (36.4 C)   Ht 5' 5.5" (1.664 m)   Wt 257 lb (116.6 kg)   LMP 10/05/2022 (Approximate)   SpO2 96%   BMI 42.12 kg/m  BP Readings from Last 3 Encounters:  11/09/22 (!) 144/96  10/16/22 134/88  10/15/22 (!) 144/93   Wt Readings from Last 3 Encounters:  11/09/22 257 lb (116.6 kg)  10/16/22 261 lb 6.4 oz (118.6 kg)  10/14/22 260 lb (117.9 kg)    Physical Exam Vitals and nursing note reviewed.  Constitutional:      General: She is not in acute distress.    Appearance: Normal appearance.  HENT:     Head: Normocephalic.  Eyes:     Conjunctiva/sclera: Conjunctivae normal.  Cardiovascular:     Rate and Rhythm: Normal rate and regular rhythm.     Pulses: Normal pulses.     Heart sounds: Normal heart sounds.  Pulmonary:     Effort: Pulmonary effort is normal.      Breath sounds: Normal breath sounds.  Musculoskeletal:     Cervical back: Normal range of motion.  Skin:    General: Skin is warm.  Neurological:     General: No focal deficit present.     Mental Status: She is alert and oriented to person, place, and time.  Psychiatric:        Mood and Affect: Mood normal.        Behavior: Behavior normal.        Thought Content: Thought content normal.        Judgment: Judgment normal.       Assessment & Plan:   Problem List Items Addressed This Visit       Cardiovascular and Mediastinum   Primary hypertension    Chronic, not controlled.  BP elevated at 144/96.  Will increase labetalol to 200 mg twice a day and continue nifedipine XL 30 mg daily.  Continue checking blood pressure at home.  Follow-up in 4 weeks.      Relevant Medications   labetalol (NORMODYNE) 200 MG tablet     Other   Less than [redacted] weeks gestation of pregnancy - Primary    She  had a positive home pregnancy test yesterday.  Will check beta-hCG levels with 2 prior miscarriages.  Will place referral to OB/GYN.  Continue taking prenatal vitamin daily.  Discussed her medications are safe to continue during pregnancy.  Discussed avoiding smoking and alcohol along with raw/undercooked meat.       Relevant Orders   Ambulatory referral to Obstetrics / Gynecology   Beta hCG quant (ref lab)    Meds ordered this encounter  Medications   DISCONTD: labetalol (NORMODYNE) 100 MG tablet    Sig: Take 1 tablet (100 mg total) by mouth 2 (two) times daily.    Dispense:  180 tablet    Refill:  1   labetalol (NORMODYNE) 200 MG tablet    Sig: Take 1 tablet (200 mg total) by mouth 2 (two) times daily.    Dispense:  180 tablet    Refill:  1    Return in about 3 weeks (around 11/30/2022) for HTN.  Gerre Scull, NP

## 2022-11-09 NOTE — Patient Instructions (Signed)
It was great to see you!  Increase your labetalol to 200mg  twice a day.   I have placed a referral to OBGYN.   Keep taking your prenatal daily.   Let's follow-up in 3-4 weeks, sooner if you have concerns.  If a referral was placed today, you will be contacted for an appointment. Please note that routine referrals can sometimes take up to 3-4 weeks to process. Please call our office if you haven't heard anything after this time frame.  Take care,  Rodman Pickle, NP  Safe Medications in Pregnancy   Acne: Benzoyl Peroxide Salicylic Acid  Backache/Headache: Tylenol: 2 regular strength every 4 hours OR              2 Extra strength every 6 hours  Colds/Coughs/Allergies: Benadryl (alcohol free) 25 mg every 6 hours as needed Breath right strips Claritin Cepacol throat lozenges Chloraseptic throat spray Cold-Eeze- up to three times per day Cough drops, alcohol free Flonase (by prescription only) Guaifenesin Mucinex Robitussin DM (plain only, alcohol free) Saline nasal spray/drops Sudafed (pseudoephedrine) & Actifed ** use only after [redacted] weeks gestation and if you do not have high blood pressure Tylenol Vicks Vaporub Zinc lozenges Zyrtec   Constipation: Colace Ducolax suppositories Fleet enema Glycerin suppositories Metamucil Milk of magnesia Miralax Senokot Smooth move tea  Diarrhea: Kaopectate Imodium A-D  *NO pepto Bismol  Hemorrhoids: Anusol Anusol HC Preparation H Tucks  Indigestion: Tums Maalox Mylanta Zantac  Pepcid  Insomnia: Benadryl (alcohol free) 25mg  every 6 hours as needed Tylenol PM Unisom, no Gelcaps  Leg Cramps: Tums MagGel  Nausea/Vomiting:  Bonine Dramamine Emetrol Ginger extract Sea bands Meclizine  Nausea medication to take during pregnancy:  Unisom (doxylamine succinate 25 mg tablets) Take one tablet daily at bedtime. If symptoms are not adequately controlled, the dose can be increased to a maximum recommended  dose of two tablets daily (1/2 tablet in the morning, 1/2 tablet mid-afternoon and one at bedtime). Vitamin B6 100mg  tablets. Take one tablet twice a day (up to 200 mg per day).  Skin Rashes: Aveeno products Benadryl cream or 25mg  every 6 hours as needed Calamine Lotion 1% cortisone cream  Yeast infection: Gyne-lotrimin 7 Monistat 7   **If taking multiple medications, please check labels to avoid duplicating the same active ingredients **take medication as directed on the label ** Do not exceed 4000 mg of tylenol in 24 hours **Do not take medications that contain aspirin or ibuprofen

## 2022-11-09 NOTE — Assessment & Plan Note (Signed)
She had a positive home pregnancy test yesterday.  Will check beta-hCG levels with 2 prior miscarriages.  Will place referral to OB/GYN.  Continue taking prenatal vitamin daily.  Discussed her medications are safe to continue during pregnancy.  Discussed avoiding smoking and alcohol along with raw/undercooked meat.

## 2022-11-10 LAB — BETA HCG QUANT (REF LAB): hCG Quant: <1 m[IU]/mL

## 2022-11-20 ENCOUNTER — Ambulatory Visit (INDEPENDENT_AMBULATORY_CARE_PROVIDER_SITE_OTHER): Payer: 59 | Admitting: Nurse Practitioner

## 2022-11-20 ENCOUNTER — Encounter: Payer: Self-pay | Admitting: Nurse Practitioner

## 2022-11-20 VITALS — BP 124/88 | HR 73 | Temp 97.5°F | Ht 65.5 in | Wt 249.0 lb

## 2022-11-20 DIAGNOSIS — J029 Acute pharyngitis, unspecified: Secondary | ICD-10-CM | POA: Insufficient documentation

## 2022-11-20 LAB — POCT RAPID STREP A (OFFICE): Rapid Strep A Screen: NEGATIVE

## 2022-11-20 NOTE — Progress Notes (Signed)
Acute Office Visit  Subjective:     Patient ID: Leslie Arroyo, female    DOB: 1991/04/06, 32 y.o.   MRN: [id number]  Chief Complaint  Patient presents with   Request Referral    ENT for Tonsil issues    HPI Patient is in today for sore throat. She states that this is the 4th time it has happened this spring. 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 She has had strep many times since high school. She would like a referral to ENT to discuss possible surgery. She has also been having bloody noses since sneezing out a flesh like "polyp." Now she is having dried blood in her nose. She had a fever 8 days ago. This is associated with a cough. She has been taking tylenol which has not been helping.   ROS See pertinent positives and negatives per HPI.     Objective:    BP 124/88 (BP Location: Left Arm)   Pulse 73   Temp (!) 97.5 F (36.4 C)   Ht 5' 5.5" (1.664 m)   Wt 249 lb (112.9 kg)   LMP 11/11/2022 (Exact Date)   SpO2 97%   BMI 40.81 kg/m  BP Readings from Last 3 Encounters:  11/20/22 124/88  11/09/22 (!) 144/96  10/16/22 134/88   Wt Readings from Last 3 Encounters:  11/20/22 249 lb (112.9 kg)  11/09/22 257 lb (116.6 kg)  10/16/22 261 lb 6.4 oz (118.6 kg)      Physical Exam Vitals and nursing note reviewed.  Constitutional:      General: She is not in acute distress.    Appearance: Normal appearance.  HENT:     Head: Normocephalic.     Mouth/Throat:     Pharynx: Posterior oropharyngeal erythema present. No oropharyngeal exudate.     Tonsils: 3+ on the right. 3+ on the left.  Eyes:     Conjunctiva/sclera: Conjunctivae normal.  Cardiovascular:     Rate and Rhythm: Normal rate and regular rhythm.     Pulses: Normal pulses.     Heart sounds: Normal heart sounds.  Pulmonary:     Effort: Pulmonary effort is normal.     Breath sounds: Normal breath sounds.  Musculoskeletal:     Cervical back: Normal range of motion.  Skin:    General: Skin is warm.   Neurological:     General: No focal deficit present.     Mental Status: She is alert and oriented to person, place, and time.  Psychiatric:        Mood and Affect: Mood normal.        Behavior: Behavior normal.        Thought Content: Thought content normal.        Judgment: Judgment normal.     Results for orders placed or performed in visit on 11/20/22  POCT rapid strep A  Result Value Ref Range   Rapid Strep A Screen Negative Negative        Assessment & Plan:   Problem List Items Addressed This Visit       Other   Sore throat - Primary    POC strep negative. She has been having frequent sore throats, with 4 since January. 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 With frequent illnesses, will place a referral to ENT. Continue to alternate tylenol and ibuprofen as needed for pain. She can also gargle with warm salt water. Follow-up if symptoms worsen or don't improve.  Relevant Orders   POCT rapid strep A (Completed)   Ambulatory referral to ENT    No orders of the defined types were placed in this encounter.   Return if symptoms worsen or fail to improve.  Gerre Scull, NP

## 2022-11-20 NOTE — Patient Instructions (Signed)
It was great to see you!  I have placed a referral to ENT  Keep taking tylenol and ibuprofen as needed for pain.  Gargle with warm salt water to help with pain and swelling as well.   Let's follow-up if your symptoms worsen or don't improve.   Take care,  Rodman Pickle, NP

## 2022-11-20 NOTE — Assessment & Plan Note (Addendum)
POC strep negative. She has been having frequent sore throats, with 4 since January. 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 With frequent illnesses, will place a referral to ENT. Continue to alternate tylenol and ibuprofen as needed for pain. She can also gargle with warm salt water. Follow-up if symptoms worsen or don't improve.

## 2022-11-23 ENCOUNTER — Ambulatory Visit: Payer: 59 | Admitting: Allergy

## 2022-11-28 ENCOUNTER — Encounter: Payer: Self-pay | Admitting: Nurse Practitioner

## 2022-11-30 ENCOUNTER — Ambulatory Visit: Payer: 59 | Admitting: Nurse Practitioner

## 2022-12-07 ENCOUNTER — Encounter: Payer: Self-pay | Admitting: Nurse Practitioner

## 2022-12-07 MED ORDER — METFORMIN HCL ER 500 MG PO TB24: 500.0000 mg | ORAL_TABLET | Freq: Every day | ORAL | 1 refills | Status: DC

## 2023-01-02 ENCOUNTER — Ambulatory Visit: Payer: 59 | Admitting: Nurse Practitioner

## 2023-01-02 ENCOUNTER — Encounter: Payer: Self-pay | Admitting: Nurse Practitioner

## 2023-01-02 VITALS — BP 106/74 | HR 75 | Temp 98.0°F | Resp 16 | Ht 65.5 in | Wt 255.0 lb

## 2023-01-02 DIAGNOSIS — N3000 Acute cystitis without hematuria: Secondary | ICD-10-CM

## 2023-01-02 DIAGNOSIS — R109 Unspecified abdominal pain: Secondary | ICD-10-CM

## 2023-01-02 LAB — POC URINALSYSI DIPSTICK (AUTOMATED)
Bilirubin, UA: NEGATIVE
Blood, UA: NEGATIVE
Glucose, UA: NEGATIVE
Ketones, UA: NEGATIVE
Nitrite, UA: NEGATIVE
Protein, UA: NEGATIVE
Spec Grav, UA: >=1.03 — AB (ref 1.010–1.025)
Urobilinogen, UA: 0.2 E.U./dL
pH, UA: 6 (ref 5.0–8.0)

## 2023-01-02 MED ORDER — NITROFURANTOIN MONOHYD MACRO 100 MG PO CAPS: 100.0000 mg | ORAL_CAPSULE | Freq: Two times a day (BID) | ORAL | 0 refills | Status: DC

## 2023-01-02 NOTE — Progress Notes (Signed)
Acute Office Visit  Subjective:     Patient ID: Leslie Arroyo, female    DOB: January 04, 1991, 32 y.o.   MRN: [id number]  Chief Complaint  Patient presents with   Flank Pain    Pulsing pain on right lower back    HPI:  Patient is in today for right lower back pain. She states this is similar to prior UTI. This started about 2 weeks ago. She describes it as an intermittent dull aching pain. She denies dysuria, urinary frequency, fevers, and abdominal pain. She has been having some nausea. She took tylenol which did not really help the pain.   ROS See pertinent positives and negatives per HPI.     Objective:    BP 106/74 (BP Location: Left Arm, Patient Position: Sitting, Cuff Size: Large)   Pulse 75   Temp 98 F (36.7 C)   Resp 16   Ht 5' 5.5" (1.664 m)   Wt 255 lb (115.7 kg)   LMP 12/15/2022 (Exact Date)   SpO2 98%   BMI 41.79 kg/m    Physical Exam Vitals and nursing note reviewed.  Constitutional:      General: She is not in acute distress.    Appearance: Normal appearance.  HENT:     Head: Normocephalic.  Eyes:     Conjunctiva/sclera: Conjunctivae normal.  Pulmonary:     Effort: Pulmonary effort is normal.  Abdominal:     Tenderness: There is no abdominal tenderness. There is no right CVA tenderness or left CVA tenderness.  Musculoskeletal:        General: Tenderness (right lumbar area, slight) present. No swelling.     Cervical back: Normal range of motion.  Skin:    General: Skin is warm.  Neurological:     General: No focal deficit present.     Mental Status: She is alert and oriented to person, place, and time.  Psychiatric:        Mood and Affect: Mood normal.        Behavior: Behavior normal.        Thought Content: Thought content normal.        Judgment: Judgment normal.     Results for orders placed or performed in visit on 01/02/23  POCT Urinalysis Dipstick (Automated)  Result Value Ref Range   Color, UA Yellow    Clarity, UA Clear     Glucose, UA Negative Negative   Bilirubin, UA Negative    Ketones, UA Negative    Spec Grav, UA >=1.030 (A) 1.010 - 1.025   Blood, UA Negative    pH, UA 6.0 5.0 - 8.0   Protein, UA Negative Negative   Urobilinogen, UA 0.2 0.2 or 1.0 E.U./dL   Nitrite, UA Negative    Leukocytes, UA Moderate (2+) (A) Negative        Assessment & Plan:   Problem List Items Addressed This Visit   None Visit Diagnoses     Acute cystitis without hematuria    -  Primary   Treat with macrobid BID x5 days. Encourage fluids. Will add on urine culture. Follow-up if symptoms don't improve.   Relevant Orders   Urine Culture   Flank pain       U/A positive for 2+ leukocytes. Will treat for UTI. Can continue tylenol as needed for pain.   Relevant Orders   POCT Urinalysis Dipstick (Automated) (Completed)       Meds ordered this encounter  Medications   nitrofurantoin, macrocrystal-monohydrate, (MACROBID) 100  MG capsule    Sig: Take 1 capsule (100 mg total) by mouth 2 (two) times daily.    Dispense:  10 capsule    Refill:  0    Return if symptoms worsen or fail to improve.  Gerre Scull, NP

## 2023-01-02 NOTE — Patient Instructions (Signed)
It was great to see you!  Start macrobid twice a day for 5 days.  Drink plenty of water  Let's follow-up if your symptoms worsen or don't improve.   Take care,  Rodman Pickle, NP

## 2023-01-02 NOTE — Progress Notes (Unsigned)
New Patient Note  RE: Leslie Arroyo MRN: [id number] DOB: 10-Jun-1991 Date of Office Visit: 01/03/2023  Consult requested by: Gerre Scull, NP Primary care provider: Gerre Scull, NP  Chief Complaint: No chief complaint on file.  History of Present Illness: I had the pleasure of seeing Leslie Arroyo for initial evaluation at the [facility] on 01/02/2023. She is a 32 y.o. female, who is referred here by Gerre Scull, NP for the evaluation of rash.  Rash started about *** ago. Mainly occurs on her ***. Describes them as ***. Individual rashes lasts about ***. No ecchymosis upon resolution. Associated symptoms include: ***.  Frequency of episodes: ***. Suspected triggers are ***. Denies any *** fevers, chills, changes in medications, foods, personal care products or recent infections. She has tried the following therapies: *** with *** benefit. Systemic steroids ***. Currently on ***.  Previous work up includes: ***. Previous history of rash/hives: ***. Patient is up to date with the following cancer screening tests: ***.  10/16/2022 PCP visit: "She states that 3 weeks ago she went to an urgent care for cold symptoms and was told that she had strep. She was given amoxicillin to take twice a day. She states that she ended up getting a rash over her feet, legs, arms that was very itchy. She then noted that on Saturday she had a fever of 103. She went to the emergency room, however left before being seen. She was given some Tylenol and the fever came down. She has not had any fever symptoms since. She denies sore throat, ear pain. She is still having fatigue. She is interested in getting tested for allergies today."  Assessment and Plan: Amelita is a 32 y.o. female with: No problem-specific Assessment & Plan notes found for this encounter.  No follow-ups on file.  No orders of the defined types were placed in this encounter.  Lab Orders  No laboratory  test(s) ordered today    Other allergy screening: Asthma: {Blank single:19197::"yes","no"} Rhino conjunctivitis: {Blank single:19197::"yes","no"} Food allergy: {Blank single:19197::"yes","no"} Medication allergy: {Blank single:19197::"yes","no"} Hymenoptera allergy: {Blank single:19197::"yes","no"} Urticaria: {Blank single:19197::"yes","no"} Eczema:{Blank single:19197::"yes","no"} History of recurrent infections suggestive of immunodeficency: {Blank single:19197::"yes","no"}  Diagnostics: Spirometry:  Tracings reviewed. Her effort: {Blank single:19197::"Good reproducible efforts.","It was hard to get consistent efforts and there is a question as to whether this reflects a maximal maneuver.","Poor effort, data can not be interpreted."} FVC: ***L FEV1: ***L, ***% predicted FEV1/FVC ratio: ***% Interpretation: {Blank single:19197::"Spirometry consistent with mild obstructive disease","Spirometry consistent with moderate obstructive disease","Spirometry consistent with severe obstructive disease","Spirometry consistent with possible restrictive disease","Spirometry consistent with mixed obstructive and restrictive disease","Spirometry uninterpretable due to technique","Spirometry consistent with normal pattern","No overt abnormalities noted given today's efforts"}.  Please see scanned spirometry results for details.  Skin Testing: {Blank single:19197::"Select foods","Environmental allergy panel","Environmental allergy panel and select foods","Food allergy panel","None","Deferred due to recent antihistamines use"}. *** Results discussed with patient/family.   Past Medical History: Patient Active Problem List   Diagnosis Date Noted  . Sore throat 11/20/2022  . Gastroesophageal reflux disease 07/10/2022  . Primary hypertension 05/03/2022  . Morbid obesity (HCC) 05/03/2022  . 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  . Prediabetes 05/30/2021  . Family history of kidney  stones 10/24/2017  . History of kidney stones 08/08/2017   Past Medical History:  Diagnosis Date  . Kidney stones   . Prediabetes    Past Surgical History: Past Surgical History:  Procedure Laterality Date  . broken leg Right   .  EXTRACORPOREAL SHOCK WAVE LITHOTRIPSY Left 09/16/2018   Procedure: EXTRACORPOREAL SHOCK WAVE LITHOTRIPSY (ESWL);  Surgeon: Rene Paci, MD;  Location: [facility];  Service: Urology;  Laterality: Left;  . KNEE ARTHROSCOPY Left   . WISDOM TOOTH EXTRACTION     Medication List:  Current Outpatient Medications  Medication Sig Dispense Refill  . labetalol (NORMODYNE) 200 MG tablet Take 1 tablet (200 mg total) by mouth 2 (two) times daily. 180 tablet 1  . metFORMIN (GLUCOPHAGE-XR) 500 MG 24 hr tablet Take 1 tablet (500 mg total) by mouth daily with breakfast. 90 tablet 1  . NIFEdipine (PROCARDIA-XL/NIFEDICAL-XL) 30 MG 24 hr tablet Take 1 tablet (30 mg total) by mouth daily. 90 tablet 0  . nitrofurantoin, macrocrystal-monohydrate, (MACROBID) 100 MG capsule Take 1 capsule (100 mg total) by mouth 2 (two) times daily. 10 capsule 0  . Prenatal Vit-Fe Fumarate-FA (PRENATAL MULTIVITAMIN) TABS tablet Take 1 tablet by mouth daily at 12 noon.     No current facility-administered medications for this visit.   Allergies: No Known Allergies Social History: Social History   Socioeconomic History  . Marital status: Married    Spouse name: Not on file  . Number of children: Not on file  . Years of education: Not on file  . Highest education level: Not on file  Occupational History  . Not on file  Tobacco Use  . Smoking status: Never  . Smokeless tobacco: Never  Vaping Use  . Vaping Use: Never used  Substance and Sexual Activity  . Alcohol use: Yes  . Drug use: Never  . Sexual activity: Yes    Birth control/protection: None  Other Topics Concern  . Not on file  Social History Narrative  . Not on file   Social Determinants of Health   Financial  Resource Strain: Not on file  Food Insecurity: No Food Insecurity (01/27/2020)   Hunger Vital Sign   . Worried About Programme researcher, broadcasting/film/video in the Last Year: Never true   . Ran Out of Food in the Last Year: Never true  Transportation Needs: No Transportation Needs (01/27/2020)   PRAPARE - Transportation   . Lack of Transportation (Medical): No   . Lack of Transportation (Non-Medical): No  Physical Activity: Not on file  Stress: Not on file  Social Connections: Not on file   Lives in a ***. Smoking: *** Occupation: ***  Environmental HistorySurveyor, minerals in the house: Copywriter, advertising in the family room: {Blank single:19197::"yes","no"} Carpet in the bedroom: {Blank single:19197::"yes","no"} Heating: {Blank single:19197::"electric","gas","heat pump"} Cooling: {Blank single:19197::"central","window","heat pump"} Pet: {Blank single:19197::"yes ***","no"}  Family History: Family History  Problem Relation Age of Onset  . Hypertension Mother   . Diabetes Mother   . Cancer Father 20       kidney cancer  . Breast cancer Paternal Uncle 45  . Hypertension Maternal Grandmother   . Diabetes Maternal Grandmother   . Cancer Maternal Grandmother        stomach  . Hypertension Maternal Grandfather   . Diabetes Maternal Grandfather    Problem                               Relation Asthma                                   *** Eczema                                ***  Food allergy                          *** Allergic rhino conjunctivitis     ***  Review of Systems  Constitutional:  Negative for appetite change, chills, fever and unexpected weight change.  HENT:  Negative for congestion and rhinorrhea.   Eyes:  Negative for itching.  Respiratory:  Negative for cough, chest tightness, shortness of breath and wheezing.   Cardiovascular:  Negative for chest pain.  Gastrointestinal:  Negative for abdominal pain.  Genitourinary:  Negative for difficulty  urinating.  Skin:  Negative for rash.  Neurological:  Negative for headaches.   Objective: LMP 12/15/2022 (Exact Date)  There is no height or weight on file to calculate BMI. Physical Exam Vitals and nursing note reviewed.  Constitutional:      Appearance: Normal appearance. She is well-developed.  HENT:     Head: Normocephalic and atraumatic.     Right Ear: Tympanic membrane and external ear normal.     Left Ear: Tympanic membrane and external ear normal.     Nose: Nose normal.     Mouth/Throat:     Mouth: Mucous membranes are moist.     Pharynx: Oropharynx is clear.  Eyes:     Conjunctiva/sclera: Conjunctivae normal.  Cardiovascular:     Rate and Rhythm: Normal rate and regular rhythm.     Heart sounds: Normal heart sounds. No murmur heard.    No friction rub. No gallop.  Pulmonary:     Effort: Pulmonary effort is normal.     Breath sounds: Normal breath sounds. No wheezing, rhonchi or rales.  Musculoskeletal:     Cervical back: Neck supple.  Skin:    General: Skin is warm.     Findings: No rash.  Neurological:     Mental Status: She is alert and oriented to person, place, and time.  Psychiatric:        Behavior: Behavior normal.  The plan was reviewed with the patient/family, and all questions/concerned were addressed.  It was my pleasure to see Midajah today and participate in her care. Please feel free to contact me with any questions or concerns.  Sincerely,  Wyline Mood, DO Allergy & Immunology  [facility] office: [phone] [facility] office: [phone]

## 2023-01-03 ENCOUNTER — Other Ambulatory Visit: Payer: Self-pay

## 2023-01-03 ENCOUNTER — Encounter: Payer: Self-pay | Admitting: Allergy

## 2023-01-03 ENCOUNTER — Ambulatory Visit: Payer: 59 | Admitting: Allergy

## 2023-01-03 VITALS — BP 130/100 | HR 73 | Temp 97.8°F | Ht 66.35 in | Wt 255.6 lb

## 2023-01-03 DIAGNOSIS — J3089 Other allergic rhinitis: Secondary | ICD-10-CM | POA: Insufficient documentation

## 2023-01-03 DIAGNOSIS — R030 Elevated blood-pressure reading, without diagnosis of hypertension: Secondary | ICD-10-CM | POA: Insufficient documentation

## 2023-01-03 DIAGNOSIS — L509 Urticaria, unspecified: Secondary | ICD-10-CM | POA: Insufficient documentation

## 2023-01-03 DIAGNOSIS — L309 Dermatitis, unspecified: Secondary | ICD-10-CM

## 2023-01-03 DIAGNOSIS — R21 Rash and other nonspecific skin eruption: Secondary | ICD-10-CM | POA: Insufficient documentation

## 2023-01-03 DIAGNOSIS — L299 Pruritus, unspecified: Secondary | ICD-10-CM

## 2023-01-03 LAB — URINE CULTURE
MICRO NUMBER:: 15153180
Result:: NO GROWTH
SPECIMEN QUALITY:: ADEQUATE

## 2023-01-03 NOTE — Assessment & Plan Note (Addendum)
3 week history of pruritic papular rash on legs mainly that resolved. Patient had strep throat and took 10 days of antibiotics around this time. She doesn't think she was pregnant when she had the rash but had miscarriage a few months ago. Denies changes in diet, personal care products.  Based on clinical history and pictures - unclear etiology. If it happens again - take pictures and let us know.

## 2023-01-03 NOTE — Assessment & Plan Note (Signed)
Follow up with PCP - already on some medications for this.

## 2023-01-03 NOTE — Assessment & Plan Note (Signed)
History of pressure induced hives. Does not take a daily antihistamine for this. Discussed with patient, that urticaria is usually caused by release of histamine by cutaneous mast cells but sometimes it is non-histamine mediated. Explained that urticaria is not always associated with allergies. In most cases, the exact etiology for urticaria can not be established and it is considered idiopathic. Start zyrtec (cetirizine) 10mg  OR allegra (fexofenadine) 180mg  once a day at night and may take twice a day if needed. If symptoms are not controlled or causes drowsiness let us know. Avoid the following potential triggers: alcohol, tight clothing, NSAIDs, hot showers and getting overheated. See below for proper skin care.  Get bloodwork to rule out other etiologies.

## 2023-01-03 NOTE — Assessment & Plan Note (Signed)
Rhino conjunctivitis symptoms mainly in the spring and milder in the fall. No prior work up. Unable to skin test today as [facility] won't allow new patient visits and procedures on the same day. Also not a candidate for skin testing due physical urticaria which would yield false positive results. Get bloodwork and will make additional recommendations based on results.

## 2023-01-03 NOTE — Patient Instructions (Addendum)
Unable to skin test today as [facility] won't allow new patient visits and procedures on the same day.  Also not a candidate for skin testing due to the hives.   Rash on legs Based on clinical history and pictures - unclear etiology. If it happens again - take pictures and let us know.  Hives Discussed with patient, that urticaria is usually caused by release of histamine by cutaneous mast cells but sometimes it is non-histamine mediated. Explained that urticaria is not always associated with allergies. In most cases, the exact etiology for urticaria can not be established and it is considered idiopathic. Start zyrtec (cetirizine) 10mg  OR allegra (fexofenadine) 180mg  once a day at night and may take twice a day if needed. If symptoms are not controlled or causes drowsiness let us know. Avoid the following potential triggers: alcohol, tight clothing, NSAIDs, hot showers and getting overheated. See below for proper skin care.   Get bloodwork - will check for environmental allergies as well. We are ordering labs, so please allow 1-2 weeks for the results to come back. With the newly implemented Cures Act, the labs might be visible to you at the same time that they become visible to me. However, I will not address the results until all of the results are back, so please be patient.   Elevated blood pressure Follow up with PCP.  Follow up in 3 months or sooner if needed.  Skin care recommendations  Bath time: Always use lukewarm water. AVOID very hot or cold water. Keep bathing time to 5-10 minutes. Do NOT use bubble bath. Use a mild soap and use just enough to wash the dirty areas. Do NOT scrub skin vigorously.  After bathing, pat dry your skin with a towel. Do NOT rub or scrub the skin.  Moisturizers and prescriptions:  ALWAYS apply moisturizers immediately after bathing (within 3 minutes). This helps to lock-in moisture. Use the moisturizer several times a day over the whole body. Good  summer moisturizers include: Aveeno, CeraVe, Cetaphil. Good winter moisturizers include: Aquaphor, Vaseline, Cerave, Cetaphil, Eucerin, Vanicream. When using moisturizers along with medications, the moisturizer should be applied about one hour after applying the medication to prevent diluting effect of the medication or moisturize around where you applied the medications. When not using medications, the moisturizer can be continued twice daily as maintenance.  Laundry and clothing: Avoid laundry products with added color or perfumes. Use unscented hypo-allergenic laundry products such as Tide free, Cheer free & gentle, and All free and clear.  If the skin still seems dry or sensitive, you can try double-rinsing the clothes. Avoid tight or scratchy clothing such as wool. Do not use fabric softeners or dyer sheets.

## 2023-01-10 ENCOUNTER — Ambulatory Visit: Payer: 59 | Admitting: Nurse Practitioner

## 2023-01-12 LAB — ALLERGENS W/TOTAL IGE AREA 2
Alternaria Alternata IgE: <0.1 kU/L
Aspergillus Fumigatus IgE: <0.1 kU/L
Bermuda Grass IgE: <0.1 kU/L
Cat Dander IgE: <0.1 kU/L
Cedar, Mountain IgE: <0.1 kU/L
Cladosporium Herbarum IgE: <0.1 kU/L
Cockroach, German IgE: <0.1 kU/L
Common Silver Birch IgE: <0.1 kU/L
Cottonwood IgE: <0.1 kU/L
D Farinae IgE: 0.15 kU/L — AB
D Pteronyssinus IgE: 0.25 kU/L — AB
Dog Dander IgE: <0.1 kU/L
Elm, American IgE: <0.1 kU/L
Johnson Grass IgE: <0.1 kU/L
Maple/Box Elder IgE: <0.1 kU/L
Mouse Urine IgE: <0.1 kU/L
Oak, White IgE: <0.1 kU/L
Pecan, Hickory IgE: <0.1 kU/L
Penicillium Chrysogen IgE: <0.1 kU/L
Pigweed, Rough IgE: <0.1 kU/L
Ragweed, Short IgE: <0.1 kU/L
Sheep Sorrel IgE Qn: <0.1 kU/L
Timothy Grass IgE: <0.1 kU/L
White Mulberry IgE: <0.1 kU/L

## 2023-01-12 LAB — CBC WITH DIFFERENTIAL/PLATELET
Basophils Absolute: 0.1 10*3/uL (ref 0.0–0.2)
Basos: 1 %
EOS (ABSOLUTE): 0.2 10*3/uL (ref 0.0–0.4)
Eos: 2 %
Hematocrit: 40.6 % (ref 34.0–46.6)
Hemoglobin: 12.4 g/dL (ref 11.1–15.9)
Immature Grans (Abs): 0.1 10*3/uL (ref 0.0–0.1)
Immature Granulocytes: 1 %
Lymphocytes Absolute: 2.5 10*3/uL (ref 0.7–3.1)
Lymphs: 25 %
MCH: 23.2 pg — ABNORMAL LOW (ref 26.6–33.0)
MCHC: 30.5 g/dL — ABNORMAL LOW (ref 31.5–35.7)
MCV: 76 fL — ABNORMAL LOW (ref 79–97)
Monocytes Absolute: 0.5 10*3/uL (ref 0.1–0.9)
Monocytes: 5 %
Neutrophils Absolute: 7 10*3/uL (ref 1.4–7.0)
Neutrophils: 66 %
Platelets: 310 10*3/uL (ref 150–450)
RBC: 5.35 x10E6/uL — ABNORMAL HIGH (ref 3.77–5.28)
RDW: 16.5 % — ABNORMAL HIGH (ref 11.7–15.4)
WBC: 10.3 10*3/uL (ref 3.4–10.8)

## 2023-01-12 LAB — COMPREHENSIVE METABOLIC PANEL
ALT: 11 IU/L (ref 0–32)
AST: 15 IU/L (ref 0–40)
Albumin: 4.3 g/dL (ref 3.9–4.9)
Alkaline Phosphatase: 90 IU/L (ref 44–121)
BUN/Creatinine Ratio: 12 (ref 9–23)
BUN: 11 mg/dL (ref 6–20)
Bilirubin Total: <0.2 mg/dL (ref 0.0–1.2)
CO2: 22 mmol/L (ref 20–29)
Calcium: 9.4 mg/dL (ref 8.7–10.2)
Chloride: 103 mmol/L (ref 96–106)
Creatinine, Ser: 0.94 mg/dL (ref 0.57–1.00)
Globulin, Total: 3.3 g/dL (ref 1.5–4.5)
Glucose: 103 mg/dL — ABNORMAL HIGH (ref 70–99)
Potassium: 4.4 mmol/L (ref 3.5–5.2)
Sodium: 139 mmol/L (ref 134–144)
Total Protein: 7.6 g/dL (ref 6.0–8.5)
eGFR: 82 mL/min/{1.73_m2} (ref >59)

## 2023-01-12 LAB — ALPHA-GAL PANEL
Allergen Lamb IgE: <0.1 kU/L
Beef IgE: <0.1 kU/L
IgE (Immunoglobulin E), Serum: 24 IU/mL (ref 6–495)
O215-IgE Alpha-Gal: <0.1 kU/L
Pork IgE: <0.1 kU/L

## 2023-01-12 LAB — C-REACTIVE PROTEIN: CRP: 30 mg/L — ABNORMAL HIGH (ref 0–10)

## 2023-01-12 LAB — ANA W/REFLEX: Anti Nuclear Antibody (ANA): POSITIVE — AB

## 2023-01-12 LAB — ENA+DNA/DS+SJORGEN'S
ENA RNP Ab: <0.2 AI (ref 0.0–0.9)
ENA SM Ab Ser-aCnc: <0.2 AI (ref 0.0–0.9)
ENA SSA (RO) Ab: 1.2 AI — ABNORMAL HIGH (ref 0.0–0.9)
ENA SSB (LA) Ab: <0.2 AI (ref 0.0–0.9)
dsDNA Ab: 2 IU/mL (ref 0–9)

## 2023-01-12 LAB — C3 AND C4
Complement C3, Serum: 202 mg/dL — ABNORMAL HIGH (ref 82–167)
Complement C4, Serum: 35 mg/dL (ref 12–38)

## 2023-01-12 LAB — TRYPTASE: Tryptase: 7.1 ug/L (ref 2.2–13.2)

## 2023-01-12 LAB — THYROID CASCADE PROFILE: TSH: 2.16 u[IU]/mL (ref 0.450–4.500)

## 2023-01-12 LAB — CHRONIC URTICARIA: cu index: 7.9 (ref <10)

## 2023-01-12 LAB — SEDIMENTATION RATE: Sed Rate: 92 mm/hr — ABNORMAL HIGH (ref 0–32)

## 2023-01-23 ENCOUNTER — Ambulatory Visit: Payer: 59 | Admitting: Nurse Practitioner

## 2023-02-08 ENCOUNTER — Encounter: Payer: Self-pay | Admitting: Nurse Practitioner

## 2023-02-08 ENCOUNTER — Telehealth (INDEPENDENT_AMBULATORY_CARE_PROVIDER_SITE_OTHER): Payer: 59 | Admitting: Nurse Practitioner

## 2023-02-08 VITALS — BP 143/93 | Ht 66.35 in | Wt 253.0 lb

## 2023-02-08 DIAGNOSIS — J3089 Other allergic rhinitis: Secondary | ICD-10-CM

## 2023-02-08 DIAGNOSIS — R768 Other specified abnormal immunological findings in serum: Secondary | ICD-10-CM | POA: Diagnosis not present

## 2023-02-08 MED ORDER — ONDANSETRON 4 MG PO TBDP: 4.0000 mg | ORAL_TABLET | Freq: Three times a day (TID) | ORAL | 1 refills | Status: DC | PRN

## 2023-02-08 NOTE — Progress Notes (Signed)
[facility] [address] [place] [address] Dept: [phone] Dept Fax: [phone]  Virtual Video Visit  I connected with Leslie Arroyo on 02/08/23 at 10:40 AM EDT by a video enabled telemedicine application and verified that I am speaking with the correct person using two identifiers.  Location patient: Home Location provider: Clinic Persons participating in the virtual visit: Patient; Rodman Pickle, NP; Malena Peer, CMA  I discussed the limitations of evaluation and management by telemedicine and the availability of in person appointments. The patient expressed understanding and agreed to proceed.  Chief Complaint  Patient presents with   Discuss Lab Work    Go over lab work from [employer], no other concerns    SUBJECTIVE:  HPI: Leslie Arroyo is a 32 y.o. female who presents to discuss recent lab work from the allergist.  She states that she met with the allergist and found out that she has allergies to dust mites.  She states that she is still having some nasal congestion and postnasal drip.  She has started having some intermittent nausea as well.  She denies fevers, sore throat, and ear pain.  She did note though that when she was there they tested her ANA and found this to be elevated along with some inflammatory markers.  She was told that she would need a referral to rheumatology for further evaluation and would like this order placed.  Patient Active Problem List   Diagnosis Date Noted   Positive ANA (antinuclear antibody) 02/08/2023   Rash and other nonspecific skin eruption 01/03/2023   Urticaria 01/03/2023   Other allergic rhinitis 01/03/2023   Elevated blood pressure reading 01/03/2023   Sore throat 11/20/2022   Gastroesophageal reflux disease 07/10/2022   Primary hypertension 05/03/2022   Morbid obesity (HCC) 05/03/2022   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    Prediabetes 05/30/2021   Family history of kidney stones 10/24/2017   History of kidney stones 08/08/2017    Past Surgical History:  Procedure Laterality Date   broken leg Right    EXTRACORPOREAL SHOCK WAVE LITHOTRIPSY Left 09/16/2018   Procedure: EXTRACORPOREAL SHOCK WAVE LITHOTRIPSY (ESWL);  Surgeon: Rene Paci, MD;  Location: [facility];  Service: Urology;  Laterality: Left;   KNEE ARTHROSCOPY Left    TONSILLECTOMY     WISDOM TOOTH EXTRACTION      Family History  Problem Relation Age of Onset   Hypertension Mother    Diabetes Mother    Cancer Father 32       kidney cancer   Breast cancer Paternal Uncle 61   Hypertension Maternal Grandmother    Diabetes Maternal Grandmother    Cancer Maternal Grandmother        stomach   Hypertension Maternal Grandfather    Diabetes Maternal Grandfather     Social History   Tobacco Use   Smoking status: Never    Passive exposure: Never   Smokeless tobacco: Never  Vaping Use   Vaping status: Never Used  Substance Use Topics   Alcohol use: Yes   Drug use: Never     Current Outpatient Medications:    labetalol (NORMODYNE) 200 MG tablet, Take 1 tablet (200 mg total) by mouth 2 (two) times daily., Disp: 180 tablet, Rfl: 1   metFORMIN (GLUCOPHAGE-XR) 500 MG 24 hr tablet, Take 1 tablet (500 mg total) by mouth daily with breakfast., Disp: 90 tablet, Rfl: 1   NIFEdipine (PROCARDIA-XL/NIFEDICAL-XL) 30 MG 24 hr tablet, Take 1 tablet (  30 mg total) by mouth daily., Disp: 90 tablet, Rfl: 0   nitrofurantoin, macrocrystal-monohydrate, (MACROBID) 100 MG capsule, Take 1 capsule (100 mg total) by mouth 2 (two) times daily., Disp: 10 capsule, Rfl: 0   ondansetron (ZOFRAN-ODT) 4 MG disintegrating tablet, Take 1 tablet (4 mg total) by mouth every 8 (eight) hours as needed for nausea or vomiting., Disp: 30 tablet, Rfl: 1   Prenatal Vit-Fe Fumarate-FA (PRENATAL MULTIVITAMIN) TABS tablet, Take 1 tablet by mouth daily at 12 noon., Disp: , Rfl:    No Known Allergies  ROS: See pertinent positives and negatives per HPI.  OBSERVATIONS/OBJECTIVE:  VITALS per patient if applicable: Today's Vitals   02/08/23 1034  BP: (!) 143/93  Weight: 253 lb (114.8 kg)  Height: 5' 6.35" (1.685 m)   Body mass index is 40.41 kg/m.    GENERAL: Alert and oriented. Appears well and in no acute distress.  HEENT: Atraumatic. Conjunctiva clear. No obvious abnormalities on inspection of external nose and ears.  NECK: Normal movements of the head and neck.  LUNGS: On inspection, no signs of respiratory distress. Breathing rate appears normal. No obvious gross SOB, gasping or wheezing, and no conversational dyspnea.  CV: No obvious cyanosis.  MS: Moves all visible extremities without noticeable abnormality.  PSYCH/NEURO: Pleasant and cooperative. No obvious depression or anxiety. Speech and thought processing grossly intact.  ASSESSMENT AND PLAN:  Problem List Items Addressed This Visit       Respiratory   Other allergic rhinitis    Chronic, ongoing.  Encouraged her to start using her Flonase daily to help with the postnasal drip.  The postnasal drip can also be causing some nausea.  Will have her start Zofran 4 mg every 8 hours as needed for nausea.  Continue collaboration recommendations from allergist.        Other   Positive ANA (antinuclear antibody) - Primary    ANA was elevated at recent allergist appointment.  She has also had some elevated ESR and CRP.  Will place referral to rheumatology for further evaluation.      Relevant Orders   Ambulatory referral to Rheumatology     I discussed the assessment and treatment plan with the patient. The patient was provided an opportunity to ask questions and all were answered. The patient agreed with the plan and demonstrated an understanding of the instructions.   The patient was advised to call back or seek an in-person evaluation if the symptoms worsen or if the condition fails to  improve as anticipated.   Gerre Scull, NP

## 2023-02-08 NOTE — Assessment & Plan Note (Signed)
ANA was elevated at recent allergist appointment.  She has also had some elevated ESR and CRP.  Will place referral to rheumatology for further evaluation.

## 2023-02-08 NOTE — Assessment & Plan Note (Signed)
Chronic, ongoing.  Encouraged her to start using her Flonase daily to help with the postnasal drip.  The postnasal drip can also be causing some nausea.  Will have her start Zofran 4 mg every 8 hours as needed for nausea.  Continue collaboration recommendations from allergist.

## 2023-02-08 NOTE — Patient Instructions (Signed)
It was great to see you!  Start Flonase daily this can help with the postnasal drip and nausea.  I have also sent in Zofran disintegrating tablets to your pharmacy to take 1 every 8 hours as needed for nausea.  I have placed a referral to rheumatology.  They will call you to schedule.  Let's follow-up in 6 months, sooner if you have concerns.  If a referral was placed today, you will be contacted for an appointment. Please note that routine referrals can sometimes take up to 3-4 weeks to process. Please call our office if you haven't heard anything after this time frame.  Take care,  Rodman Pickle, NP

## 2023-04-08 NOTE — Progress Notes (Unsigned)
Follow Up Note  RE: Leslie Arroyo MRN: [id number] DOB: Jun 23, 1991 Date of Office Visit: 04/09/2023  Referring provider: Gerre Scull, NP Primary care provider: Gerre Scull, NP  Chief Complaint: Urticaria  History of Present Illness: I had the pleasure of seeing Leslie Arroyo for a follow up visit at the [facility] on 04/09/2023. She is a 32 y.o. female, who is being followed for rash, allergic rhinoconjunctivitis. Her previous allergy office visit was on 01/03/2023 with Dr. Selena Batten. Today is a regular follow up visit.  Discussed the use of AI scribe software for clinical note transcription with the patient, who gave verbal consent to proceed.  History of Present Illness   The patient, with a history of pressure-induced hives and a recent rash outbreak, reports no significant changes in her health since the last visit in July. She denies any recurrence of the rash on her legs, but notes that she continues to experience hives when pressure is applied to her skin, such as when an object rests on her arm for an extended period. These hives typically resolve within an hour or two and are not significantly bothersome to the patient.  The patient also reports chronic pain in her knee and ankle, both of which have been previously surgically treated. She denies any new joint pain, swelling, or muscle pain.  Blood work from the previous visit revealed a slightly positive ANA and SSA antibody, which can be indicative of certain rheumatological conditions. Additionally sed rate and crp was elevated.   The patient also has environmental allergies, with a noted sensitivity to dust. She has been taking generic Claritin for these allergies. She has not tried taking an allergy medication daily for her pressure-induced hives.   Assessment and Plan: Aryelle is a 32 y.o. female with: Rash and other nonspecific skin eruption Past history - 3 week history of pruritic papular rash on  legs mainly that resolved. Patient had strep throat and took 10 days of antibiotics around this time. She doesn't think she was pregnant when she had the rash but had miscarriage a few months ago.  Interim history - no recurrence. 2024 bloodwork (CBC diff, CMP, TSH, tryptase, alpha gal) normal. ANA and SSA ab slightly elevated. Crp and esr high.  Monitor symptoms. Recheck CRP and ESR. Keep rheumatology appointment in January.  Urticaria Mainly pressure induced.  Start zyrtec (cetirizine) 10mg  OR allegra (fexofenadine) 180mg  once a day at night  instead of Claritin. May take twice a day if hives flare up.  If symptoms are not controlled or causes drowsiness let us know. Avoid the following potential triggers: alcohol, tight clothing, NSAIDs, hot showers and getting overheated. Continue proper skin care.   Allergic rhinitis due to dust mite Past history - 2024 bloodwork borderline positive to dust mites.  See below for environmental control measures. The antihistamines as above should also help with these symptoms.  Return in about 6 months (around 10/08/2023).  No orders of the defined types were placed in this encounter.  Lab Orders         C-reactive protein         Sedimentation rate      Diagnostics: None.   Medication List:  Current Outpatient Medications  Medication Sig Dispense Refill   labetalol (NORMODYNE) 200 MG tablet Take 1 tablet (200 mg total) by mouth 2 (two) times daily. 180 tablet 1   metFORMIN (GLUCOPHAGE-XR) 500 MG 24 hr tablet Take 1 tablet (500 mg total)  by mouth daily with breakfast. 90 tablet 1   NIFEdipine (PROCARDIA-XL/NIFEDICAL-XL) 30 MG 24 hr tablet Take 1 tablet (30 mg total) by mouth daily. 90 tablet 0   nitrofurantoin, macrocrystal-monohydrate, (MACROBID) 100 MG capsule Take 1 capsule (100 mg total) by mouth 2 (two) times daily. 10 capsule 0   ondansetron (ZOFRAN-ODT) 4 MG disintegrating tablet Take 1 tablet (4 mg total) by mouth every 8 (eight) hours  as needed for nausea or vomiting. 30 tablet 1   Prenatal Vit-Fe Fumarate-FA (PRENATAL MULTIVITAMIN) TABS tablet Take 1 tablet by mouth daily at 12 noon.     No current facility-administered medications for this visit.   Allergies: No Known Allergies I reviewed her past medical history, social history, family history, and environmental history and no significant changes have been reported from her previous visit.  Review of Systems  Constitutional:  Negative for appetite change, chills, fever and unexpected weight change.  HENT:  Negative for congestion and rhinorrhea.   Eyes:  Negative for itching.  Respiratory:  Negative for cough, chest tightness, shortness of breath and wheezing.   Cardiovascular:  Negative for chest pain.  Gastrointestinal:  Negative for abdominal pain.  Genitourinary:  Negative for difficulty urinating.  Skin:  Positive for rash.  Neurological:  Negative for headaches.    Objective: BP 110/80   Pulse 73   Temp 98.2 F (36.8 C) (Temporal)   Resp 16   Wt 258 lb 12.8 oz (117.4 kg)   SpO2 98%   BMI 41.33 kg/m  Body mass index is 41.33 kg/m. Physical Exam Vitals and nursing note reviewed.  Constitutional:      Appearance: Normal appearance. She is well-developed.  HENT:     Head: Normocephalic and atraumatic.     Right Ear: Tympanic membrane and external ear normal.     Left Ear: Tympanic membrane and external ear normal.     Nose: Nose normal.     Mouth/Throat:     Mouth: Mucous membranes are moist.     Pharynx: Oropharynx is clear.  Eyes:     Conjunctiva/sclera: Conjunctivae normal.  Cardiovascular:     Rate and Rhythm: Normal rate and regular rhythm.     Heart sounds: Normal heart sounds. No murmur heard.    No friction rub. No gallop.  Pulmonary:     Effort: Pulmonary effort is normal.     Breath sounds: Normal breath sounds. No wheezing, rhonchi or rales.  Musculoskeletal:     Cervical back: Neck supple.  Skin:    General: Skin is warm.      Comments: Red streaks on face, identified as fake blood stains.   Neurological:     Mental Status: She is alert and oriented to person, place, and time.  Psychiatric:        Behavior: Behavior normal.    Previous notes and tests were reviewed. The plan was reviewed with the patient/family, and all questions/concerned were addressed.  It was my pleasure to see Izzie today and participate in her care. Please feel free to contact me with any questions or concerns.  Sincerely,  Wyline Mood, DO Allergy & Immunology  [facility] office: [phone] [facility] office: [phone]

## 2023-04-09 ENCOUNTER — Other Ambulatory Visit: Payer: Self-pay

## 2023-04-09 ENCOUNTER — Ambulatory Visit: Payer: 59 | Admitting: Allergy

## 2023-04-09 ENCOUNTER — Encounter: Payer: Self-pay | Admitting: Allergy

## 2023-04-09 VITALS — BP 110/80 | HR 73 | Temp 98.2°F | Resp 16 | Wt 258.8 lb

## 2023-04-09 DIAGNOSIS — R21 Rash and other nonspecific skin eruption: Secondary | ICD-10-CM

## 2023-04-09 DIAGNOSIS — L509 Urticaria, unspecified: Secondary | ICD-10-CM | POA: Diagnosis not present

## 2023-04-09 DIAGNOSIS — J3089 Other allergic rhinitis: Secondary | ICD-10-CM | POA: Diagnosis not present

## 2023-04-09 NOTE — Patient Instructions (Addendum)
Hives Start zyrtec (cetirizine) 10mg  OR allegra (fexofenadine) 180mg  once a day at night  instead of Claritin. May take twice a day if hives flare up.  If symptoms are not controlled or causes drowsiness let us know. Avoid the following potential triggers: alcohol, tight clothing, NSAIDs, hot showers and getting overheated. Continue proper skin care.   Dust mite allergy See below for environmental control measures. The antihistamines as above should also help with these symptoms.  Elevated ESR and crp in the past Get bloodwork We are ordering labs, so please allow 1-2 weeks for the results to come back. With the newly implemented Cures Act, the labs might be visible to you at the same time that they become visible to me. However, I will not address the results until all of the results are back, so please be patient.  In the meantime, continue recommendations in your patient instructions, including avoidance measures (if applicable), until you hear from me.  Follow up in 6 months or sooner if needed. Keep appointment with rheumatology in January.  Control of House Dust Mite Allergen Dust mite allergens are a common trigger of allergy and asthma symptoms. While they can be found throughout the house, these microscopic creatures thrive in warm, humid environments such as bedding, upholstered furniture and carpeting. Because so much time is spent in the bedroom, it is essential to reduce mite levels there.  Encase pillows, mattresses, and box springs in special allergen-proof fabric covers or airtight, zippered plastic covers.  Bedding should be washed weekly in hot water (130 F) and dried in a hot dryer. Allergen-proof covers are available for comforters and pillows that can't be regularly washed.  Wash the allergy-proof covers every few months. Minimize clutter in the bedroom. Keep pets out of the bedroom.  Keep humidity less than 50% by using a dehumidifier or air conditioning. You can buy  a humidity measuring device called a hygrometer to monitor this.  If possible, replace carpets with hardwood, linoleum, or washable area rugs. If that's not possible, vacuum frequently with a vacuum that has a HEPA filter. Remove all upholstered furniture and non-washable window drapes from the bedroom. Remove all non-washable stuffed toys from the bedroom.  Wash stuffed toys weekly.

## 2023-04-10 LAB — SEDIMENTATION RATE: Sed Rate: 53 mm/h — ABNORMAL HIGH (ref 0–32)

## 2023-04-10 LAB — C-REACTIVE PROTEIN: CRP: 33 mg/L — ABNORMAL HIGH (ref 0–10)

## 2023-05-18 ENCOUNTER — Other Ambulatory Visit: Payer: Self-pay | Admitting: Nurse Practitioner

## 2023-05-21 MED ORDER — NIFEDIPINE ER OSMOTIC RELEASE 30 MG PO TB24: 30.0000 mg | ORAL_TABLET | Freq: Every day | ORAL | 0 refills | Status: DC

## 2023-05-21 NOTE — Telephone Encounter (Signed)
Refill request for  Nifedipine 30 mg LR 1//24, #90, 0 rf LOV 01/02/23 FOV  none scheduled   Also asking for Nitrofurantoin.  Please review and advise.  Thanks. Dm/cma

## 2023-05-28 NOTE — Progress Notes (Signed)
Office Visit Note  Patient: Leslie Arroyo             Date of Birth: August 27, 1990           MRN: [id number]             PCP: Gerre Scull, NP Referring: Gerre Scull, NP Visit Date: 06/11/2023 Occupation: @GUAROCC @  Subjective:  No chief complaint on file.   History of Present Illness: Leslie Arroyo is a 32 y.o. female seen in consultation per request of her PCP. Patient developed sore throat in June 2024 which was treated with antibiotics.  She developed rash soon after she was pruritic.  She was evaluated by allergist.  Etiology was uncertain.  Patient also was noted to have pressure induced hives.  She was started on Zyrtec 10 mg.  At the time labs were obtained which came abnormal and she was referred to me for further evaluation.  She had no recurrence of rash since then.  Patient denies any history of fatigue, oral ulcers, nasal ulcers, malar rash, sicca symptoms, photosensitivity, Raynaud's, lymphadenopathy or inflammatory arthritis.  Has mild morning stiffness in the morning.  She denies any joint pain.  Patient believes that her mother has autoimmune disease.  She will find out the details.  She is currently unemployed.  She used to work as a Haematologist.  She enjoys hiking and walking.  She also likes traveling.  She goes to the gym and does cardio.  She is gravida 2, para 0, miscarriages 2.  She is seeing her GYN for infertility.  No history of DVTs.  Activities of Daily Living:  Patient reports morning stiffness for 1 hour.   Patient Denies nocturnal pain.  Difficulty dressing/grooming: Denies Difficulty climbing stairs: Denies Difficulty getting out of chair: Denies Difficulty using hands for taps, buttons, cutlery, and/or writing: Denies  Review of Systems  Constitutional:  Negative for fatigue.  HENT:  Negative for mouth sores and mouth dryness.   Eyes:  Negative for pain, visual disturbance and dryness.  Respiratory:  Negative for shortness  of breath.   Cardiovascular:  Negative for chest pain and palpitations.  Gastrointestinal:  Positive for constipation. Negative for blood in stool and diarrhea.  Endocrine: Negative for increased urination.  Genitourinary:  Negative for involuntary urination.  Musculoskeletal:  Positive for morning stiffness. Negative for joint pain, gait problem, joint pain, joint swelling, myalgias, muscle weakness, muscle tenderness and myalgias.  Skin:  Negative for color change, rash, hair loss and sensitivity to sunlight.  Allergic/Immunologic: Negative for susceptible to infections.  Neurological:  Positive for tremors and headaches. Negative for dizziness.  Hematological:  Negative for swollen glands.  Psychiatric/Behavioral:  Positive for depressed mood. Negative for sleep disturbance. The patient is nervous/anxious.     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   Prediabetes 05/30/2021   Family history of kidney stones 10/24/2017   History of kidney stones 08/08/2017    Past Medical History:  Diagnosis Date   Kidney stones    Prediabetes     Family History  Problem Relation Age  of Onset   Hypertension Mother    Diabetes Mother    Cancer Father 24       kidney cancer   Congenital heart disease Brother    Breast cancer Paternal Uncle 61   Hypertension Maternal Grandmother    Diabetes Maternal Grandmother    Cancer Maternal Grandmother        stomach   Hypertension Maternal Grandfather    Diabetes Maternal Grandfather    Past Surgical History:  Procedure  Laterality Date   EXTRACORPOREAL SHOCK WAVE LITHOTRIPSY Left 09/16/2018   Procedure: EXTRACORPOREAL SHOCK WAVE LITHOTRIPSY (ESWL);  Surgeon: Rene Paci, MD;  Location: [facility];  Service: Urology;  Laterality: Left;   HARDWARE REMOVAL Right    leg   KNEE ARTHROSCOPY Left    LEG SURGERY Right    WISDOM TOOTH EXTRACTION     Social History   Social History Narrative   Not on file   Immunization History  Administered Date(s) Administered   HPV 9-valent 03/28/2022, 05/22/2022, 10/16/2022   Influenza,inj,Quad PF,6+ Mos 03/28/2022   PFIZER(Purple Top)SARS-COV-2 Vaccination 11/14/2019, 12/08/2019   Td 05/04/2015     Objective: Vital Signs: BP 125/87 (BP Location: Right Arm, Patient Position: Sitting, Cuff Size: Large)   Pulse 73   Ht 5\' 6"  (1.676 m)   Wt 259 lb 3.2 oz (117.6 kg)   BMI 41.84 kg/m    Physical Exam Vitals and nursing note reviewed.  Constitutional:      Appearance: She is well-developed.  HENT:     Head: Normocephalic and atraumatic.  Eyes:     Conjunctiva/sclera: Conjunctivae normal.  Cardiovascular:     Rate and Rhythm: Normal rate and regular rhythm.     Heart sounds: Normal heart sounds.  Pulmonary:     Effort: Pulmonary effort is normal.     Breath sounds: Normal breath sounds.  Abdominal:     General: Bowel sounds are normal.     Palpations: Abdomen is soft.  Musculoskeletal:     Cervical back: Normal range of motion.  Lymphadenopathy:     Cervical: No cervical adenopathy.  Skin:    General: Skin is warm and dry.     Capillary Refill: Capillary refill takes less than 2 seconds.  Neurological:     Mental Status: She is alert and oriented to person, place, and time.  Psychiatric:        Behavior: Behavior normal.      Musculoskeletal Exam: Cervical, thoracic and lumbar spine 1 good range of motion.  Shoulder joints, elbow joints, wrist joints, MCPs PIPs and DIPs with good range of motion with no synovitis.  Hip joints, knee joints,  ankles, MTPs and PIPs with good range of motion with no synovitis.  CDAI Exam: CDAI Score: -- Patient Global: --; Provider Global: -- Swollen: --; Tender: -- Joint Exam 06/11/2023   No joint exam has been documented for this visit   There is currently no information documented on the homunculus. Go to the Rheumatology activity and complete the homunculus joint exam.  Investigation: No additional findings.  Imaging: No results found.  Recent Labs: Lab Results  Component Value Date   WBC 10.3 01/03/2023   HGB 12.4 01/03/2023   PLT 310 01/03/2023   NA 139 01/03/2023   K 4.4 01/03/2023   CL 103 01/03/2023   CO2 22 01/03/2023   GLUCOSE 103 (H) 01/03/2023   BUN 11 01/03/2023   CREATININE 0.94 01/03/2023   BILITOT <0.2 01/03/2023   ALKPHOS 90 01/03/2023  AST 15 01/03/2023   ALT 11 01/03/2023   PROT 7.6 01/03/2023   ALBUMIN 4.3 01/03/2023   CALCIUM 9.4 01/03/2023   GFRAA >60 01/16/2020   April 09, 2023 ANA positive, SSA 1.2, SSB negative, Smith negative, RNP negative, dsDNA negative, C3-C4 normal, ESR 92, CRP 30, TSH normal   January 02, 2023 UA positive WBC, negative for protein urine culture negative  Speciality Comments: No specialty comments available.  Procedures:  No procedures performed Allergies: Patient has no known allergies.   Assessment / Plan:     Visit Diagnoses: Positive ANA (antinuclear antibody) -patient developed rash in June 2024 after being treated with antibiotics.  According to the patient the rash lasted for about 2 weeks and then resolved.  The rash was pruritic.  At the time she had labs which showed positive ANA and positive SSA antibody.  She was referred to me for further evaluation.  Patient denies any history of oral ulcers, nasal ulcers, malar rash, photosensitivity, Raynaud's, lymphadenopathy or inflammatory arthritis.  She had good salivary pool and moisture in her eyes.  I will repeat labs today.  Plan: ANA, Sjogrens syndrome-A extractable  nuclear antibody, C3 and C4, Sedimentation rate, Beta-2 glycoprotein antibodies, Cardiolipin antibodies, IgG, IgM, IgA.  Will discuss results at the follow-up visit.  Association of SSA antibody with arrhythmias and fetal cardiac conduction abnormalities was discussed.  Rash and other nonspecific skin eruption-patient had no recurrence of rash after the episode of rash developed after the antibiotic usage.  Other medical problems are listed as follows:  Other allergic rhinitis  Prediabetes  Primary hypertension-blood sugar is normal today at 125/87.  Gastroesophageal reflux disease without esophagitis  History of kidney stones  Female infertility associated with anovulation-she is gravida 2, para 0, miscarriages 2.  She has been followed by her GYN.  Morbid obesity (HCC)  Orders: Orders Placed This Encounter  Procedures   ANA   Sjogrens syndrome-A extractable nuclear antibody   C3 and C4   Sedimentation rate   Beta-2 glycoprotein antibodies   Cardiolipin antibodies, IgG, IgM, IgA   No orders of the defined types were placed in this encounter.     Follow-Up Instructions: Return for Positive ANA, positive SSA antibody.   Pollyann Savoy, MD  Note - This record has been created using Animal nutritionist.  Chart creation errors have been sought, but may not always  have been located. Such creation errors do not reflect on  the standard of medical care.

## 2023-06-11 ENCOUNTER — Encounter: Payer: Self-pay | Admitting: Rheumatology

## 2023-06-11 ENCOUNTER — Ambulatory Visit: Payer: 59 | Attending: Rheumatology | Admitting: Rheumatology

## 2023-06-11 VITALS — BP 125/87 | HR 73 | Ht 66.0 in | Wt 259.2 lb

## 2023-06-11 DIAGNOSIS — K219 Gastro-esophageal reflux disease without esophagitis: Secondary | ICD-10-CM

## 2023-06-11 DIAGNOSIS — J3089 Other allergic rhinitis: Secondary | ICD-10-CM

## 2023-06-11 DIAGNOSIS — N970 Female infertility associated with anovulation: Secondary | ICD-10-CM

## 2023-06-11 DIAGNOSIS — R7303 Prediabetes: Secondary | ICD-10-CM

## 2023-06-11 DIAGNOSIS — L509 Urticaria, unspecified: Secondary | ICD-10-CM

## 2023-06-11 DIAGNOSIS — R21 Rash and other nonspecific skin eruption: Secondary | ICD-10-CM | POA: Diagnosis not present

## 2023-06-11 DIAGNOSIS — Z87442 Personal history of urinary calculi: Secondary | ICD-10-CM

## 2023-06-11 DIAGNOSIS — R768 Other specified abnormal immunological findings in serum: Secondary | ICD-10-CM | POA: Diagnosis not present

## 2023-06-11 DIAGNOSIS — I10 Essential (primary) hypertension: Secondary | ICD-10-CM

## 2023-06-15 LAB — SEDIMENTATION RATE: Sed Rate: 46 mm/h — ABNORMAL HIGH (ref 0–20)

## 2023-06-15 LAB — C3 AND C4
C3 Complement: 192 mg/dL (ref 83–193)
C4 Complement: 32 mg/dL (ref 15–57)

## 2023-06-15 LAB — BETA-2 GLYCOPROTEIN ANTIBODIES
Beta-2 Glyco 1 IgA: <2 U/mL (ref <20.0)
Beta-2 Glyco 1 IgM: <2 U/mL (ref <20.0)
Beta-2 Glyco I IgG: <2 U/mL (ref <20.0)

## 2023-06-15 LAB — SJOGRENS SYNDROME-A EXTRACTABLE NUCLEAR ANTIBODY: SSA (Ro) (ENA) Antibody, IgG: 1.3 AI — AB

## 2023-06-15 LAB — CARDIOLIPIN ANTIBODIES, IGG, IGM, IGA
Anticardiolipin IgA: <2 [APL'U]/mL (ref <20.0)
Anticardiolipin IgG: <2 [GPL'U]/mL (ref <20.0)
Anticardiolipin IgM: <2 [MPL'U]/mL (ref <20.0)

## 2023-06-15 LAB — ANA: Anti Nuclear Antibody (ANA): NEGATIVE

## 2023-06-17 NOTE — Progress Notes (Signed)
Sed rate is mildly elevated, SSA antibody positive, anticardiolipin negative, beta-2 GP 1 negative, ANA negative, complements normal.  I will discuss results at the follow-up visit.

## 2023-06-22 ENCOUNTER — Other Ambulatory Visit: Payer: Self-pay

## 2023-06-22 ENCOUNTER — Encounter: Payer: Self-pay | Admitting: Nurse Practitioner

## 2023-06-22 MED ORDER — LABETALOL HCL 200 MG PO TABS: 200.0000 mg | ORAL_TABLET | Freq: Two times a day (BID) | ORAL | 1 refills | Status: DC

## 2023-06-22 MED ORDER — METFORMIN HCL ER 500 MG PO TB24: 500.0000 mg | ORAL_TABLET | Freq: Every day | ORAL | 1 refills | Status: DC

## 2023-06-22 NOTE — Telephone Encounter (Signed)
Chart supports rx. Last OV: 02/08/2023 Next OV: Not found

## 2023-07-11 ENCOUNTER — Encounter: Payer: 59 | Admitting: Rheumatology

## 2023-07-12 NOTE — Progress Notes (Signed)
Office Visit Note  Patient: Leslie Arroyo             Date of Birth: 1990-08-02           MRN: [id number]             PCP: Gerre Scull, NP Referring: Gerre Scull, NP Visit Date: 07/26/2023 Occupation: @GUAROCC @  Subjective:  Abnormal labs and neck pain  History of Present Illness: Leslie Arroyo is a 33 y.o. female was recently evaluated for positive ANA and positive SSA antibody.  Patient denies any history of oral ulcers, nasal ulcers, malar rash, photosensitivity, Raynaud's, sicca symptoms, lymphadenopathy or arthralgias.  There is no history of inflammatory arthritis.  She developed a rash in the past which resolved.  She was also found to have elevated sedimentation rate.    Activities of Daily Living:  Patient reports morning stiffness for a few minutes.   Patient Reports nocturnal pain.  Difficulty dressing/grooming: Denies Difficulty climbing stairs: Denies Difficulty getting out of chair: Denies Difficulty using hands for taps, buttons, cutlery, and/or writing: Reports  Review of Systems  Constitutional:  Negative for fatigue.  HENT:  Negative for mouth sores and mouth dryness.   Eyes:  Negative for dryness.  Respiratory:  Negative for shortness of breath and difficulty breathing.   Cardiovascular:  Negative for chest pain and palpitations.  Gastrointestinal:  Positive for nausea and vomiting. Negative for blood in stool, constipation and diarrhea.  Endocrine: Negative for increased urination.  Genitourinary:  Negative for involuntary urination.  Musculoskeletal:  Positive for joint pain, joint pain, myalgias, muscle weakness, morning stiffness, muscle tenderness and myalgias. Negative for gait problem and joint swelling.  Skin:  Negative for color change, rash, hair loss and sensitivity to sunlight.  Allergic/Immunologic: Negative for susceptible to infections.  Neurological:  Positive for numbness and parasthesias. Negative for dizziness and headaches.   Hematological:  Negative for swollen glands.  Psychiatric/Behavioral:  Negative for depressed mood and sleep disturbance. The patient is not nervous/anxious.     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   Prediabetes 05/30/2021   Family history of kidney stones 10/24/2017   History of kidney stones 08/08/2017    Past Medical History:  Diagnosis Date   Kidney stones    Prediabetes     Family History  Problem Relation Age of Onset   Hypertension Mother    Diabetes Mother    Rheum arthritis Mother    Lupus Mother    Cancer Father 47       kidney cancer   Congenital heart disease Brother    Breast cancer Paternal Uncle 68   Rheum arthritis Maternal Grandmother    Hypertension Maternal Grandmother    Diabetes Maternal Grandmother    Cancer Maternal Grandmother        stomach   Hypertension Maternal Grandfather    Diabetes Maternal Grandfather    Past Surgical History:  Procedure Laterality Date   EXTRACORPOREAL SHOCK WAVE LITHOTRIPSY Left 09/16/2018   Procedure: EXTRACORPOREAL SHOCK WAVE LITHOTRIPSY (ESWL);  Surgeon: Rene Paci, MD;  Location: [facility];  Service: Urology;  Laterality: Left;   HARDWARE REMOVAL Right    leg   KNEE ARTHROSCOPY Left    LEG SURGERY Right    WISDOM TOOTH EXTRACTION     Social History   Social History Narrative   Not on file   Immunization History  Administered Date(s) Administered   HPV 9-valent 03/28/2022, 05/22/2022, 10/16/2022   Influenza,inj,Quad PF,6+ Mos 03/28/2022   PFIZER(Purple  Top)SARS-COV-2 Vaccination 11/14/2019, 12/08/2019   Td 05/04/2015     Objective: Vital Signs: BP 120/82 (BP Location: Left Arm, Patient Position: Sitting, Cuff Size: Large)   Pulse 72   Resp 14   Ht 5\' 6"  (1.676 m)   Wt 264 lb 12.8 oz (120.1 kg)   LMP 06/19/2023   BMI 42.74 kg/m    Physical Exam Vitals and nursing note reviewed.  Constitutional:      Appearance: She is well-developed.  HENT:     Head: Normocephalic and atraumatic.  Eyes:     Conjunctiva/sclera: Conjunctivae normal.  Cardiovascular:     Rate and Rhythm: Normal rate and regular rhythm.     Heart sounds: Normal heart sounds.  Pulmonary:     Effort: Pulmonary effort is normal.     Breath sounds: Normal breath sounds.  Abdominal:     General: Bowel sounds are normal.     Palpations: Abdomen is soft.  Musculoskeletal:     Cervical back: Normal range of motion.  Lymphadenopathy:     Cervical: No cervical adenopathy.  Skin:    General: Skin is warm and dry.     Capillary Refill: Capillary refill takes less than 2 seconds.  Neurological:     Mental Status: She is alert and oriented to person, place, and time.  Psychiatric:        Behavior: Behavior normal.      Musculoskeletal Exam: Cervical, thoracic lumbar spine 1 good range of motion.  Shoulder joints, elbow joints, wrist joints, MCPs PIPs and DIPs with good range of motion with no synovitis.  Hip joints, knee joints, ankles, MTPs and PIPs were in good range of motion with no synovitis.  CDAI Exam: CDAI Score: -- Patient Global: --; Provider Global: -- Swollen: --; Tender: -- Joint Exam 07/26/2023   No joint exam has been documented for this visit   There is currently no information documented on the homunculus. Go to the Rheumatology activity and complete the homunculus joint exam.  Investigation: No additional findings.  Imaging: No results found.  Recent Labs: Lab Results  Component Value Date   WBC 10.3 01/03/2023   HGB 12.4 01/03/2023    PLT 310 01/03/2023   NA 139 01/03/2023   K 4.4 01/03/2023   CL 103 01/03/2023   CO2 22 01/03/2023   GLUCOSE 103 (H) 01/03/2023   BUN 11 01/03/2023   CREATININE 0.94 01/03/2023   BILITOT <0.2 01/03/2023   ALKPHOS 90 01/03/2023   AST 15 01/03/2023   ALT 11 01/03/2023   PROT 7.6 01/03/2023   ALBUMIN 4.3 01/03/2023   CALCIUM 9.4 01/03/2023   GFRAA >60 01/16/2020   June 11, 2023 ANA negative, SSA positive, anticardiolipin negative, beta-2 GP 1 negative, complements normal,  ESR 46  April 09, 2023 ANA positive, SSA 1.2, SSB negative, Smith negative, RNP negative, dsDNA negative, C3-C4 normal, ESR 92, CRP 30, TSH normal   January 02, 2023 UA positive WBC, negative for protein urine culture negative  Speciality Comments: No specialty comments available.  Procedures:  No procedures performed Allergies: Patient has  no known allergies.   Assessment / Plan:     Visit Diagnoses: Positive ANA (antinuclear antibody) - Repeat ANA negative, SSA antibody positive (low titer).  Sed rate elevated (improved), complements normal.  Patient does not have any clinical features of autoimmune disease at this time.  I advised her to notify me if she develops any new symptoms.  I also advised her to repeat sed rate with her PCP.  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  She is planning to go for physical therapy.  I also gave her a handout on neck exercises.  A stand-up desk was recommended.  Rash - Developed rash in June 2024 after being treated with antibiotics rash lasted for 2 weeks and then resolved no recurrence of rash  Other allergic rhinitis  Prediabetes  Primary hypertension  Gastroesophageal reflux disease without esophagitis  History of kidney stones  Female infertility associated with anovulation  Morbid obesity (HCC)  Orders: No orders of the defined types were placed in this encounter.  No orders of the defined types were placed in  this encounter.    Follow-Up Instructions: Return in about 6 months (around 01/23/2024), or if symptoms worsen or fail to improve, for neck pain, elevated sed rate, +SSA.   Pollyann Savoy, MD  Note - This record has been created using Animal nutritionist.  Chart creation errors have been sought, but may not always  have been located. Such creation errors do not reflect on  the standard of medical care.

## 2023-07-20 ENCOUNTER — Encounter: Payer: Self-pay | Admitting: Nurse Practitioner

## 2023-07-20 NOTE — Telephone Encounter (Signed)
I called and spoke with patient and she has been having neck and shoulder pain since December. I offered to make patient an appointment since she refused going to urgent care. Appointment made for 07/23/23 at 2pm to see Salvatore Decent, NP

## 2023-07-23 ENCOUNTER — Ambulatory Visit: Payer: 59 | Admitting: Internal Medicine

## 2023-07-23 ENCOUNTER — Encounter: Payer: Self-pay | Admitting: Internal Medicine

## 2023-07-23 VITALS — BP 122/82 | HR 70 | Temp 98.6°F | Ht 66.0 in | Wt 263.8 lb

## 2023-07-23 DIAGNOSIS — M5412 Radiculopathy, cervical region: Secondary | ICD-10-CM | POA: Diagnosis not present

## 2023-07-23 MED ORDER — METHOCARBAMOL 500 MG PO TABS
500.0000 mg | ORAL_TABLET | Freq: Three times a day (TID) | ORAL | 0 refills | Status: DC | PRN
Start: 2023-07-23 — End: 2023-08-21

## 2023-07-23 NOTE — Patient Instructions (Addendum)
Inquire about Ergonomic Assessment at work   Can continue Aleve

## 2023-07-23 NOTE — Progress Notes (Signed)
[facility] [address] [place] [address] Dept: [phone] Dept Fax: [phone]  Acute Care Office Visit  Subjective:   Leslie Arroyo October 27, 1990 07/23/2023  Chief Complaint  Patient presents with   Neck Pain    Started December again after prior injury    HPI: Discussed the use of AI scribe software for clinical note transcription with the patient, who gave verbal consent to proceed.  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 They describe the pain as a dull, tugging sensation that radiates from the back of their neck down to their right arm, occasionally causing numbness. The pain is exacerbated by prolonged computer use and is only partially relieved by dual-action Aleve. The patient has a history of a dislocated right shoulder blade in high school and scoliosis, and has been working extensively on the computer for the past few years. They also report occasional right-sided weakness, particularly noticeable when holding objects like a coffee cup. The patient has tried ibuprofen for pain relief, but it has been ineffective.          The following portions of the patient's history were reviewed and updated as appropriate: past medical history, past surgical history, family history, social history, allergies, medications, and problem list.   Patient Active Problem List   Diagnosis Date Noted   Positive ANA (antinuclear antibody) 02/08/2023   Rash and other nonspecific skin eruption 01/03/2023   Urticaria 01/03/2023   Other allergic rhinitis 01/03/2023   Elevated blood pressure reading 01/03/2023   Sore throat 11/20/2022   Gastroesophageal reflux disease 07/10/2022   Primary hypertension 05/03/2022   Morbid obesity (HCC) 05/03/2022   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   Prediabetes 05/30/2021    Family history of kidney stones 10/24/2017   History of kidney stones 08/08/2017   Past Medical History:  Diagnosis Date   Kidney stones    Prediabetes    Past Surgical History:  Procedure Laterality Date   EXTRACORPOREAL SHOCK WAVE LITHOTRIPSY Left 09/16/2018   Procedure: EXTRACORPOREAL SHOCK WAVE LITHOTRIPSY (ESWL);  Surgeon: Rene Paci, MD;  Location: [facility];  Service: Urology;  Laterality: Left;   HARDWARE REMOVAL Right    leg   KNEE ARTHROSCOPY Left    LEG SURGERY Right    WISDOM TOOTH EXTRACTION     Family History  Problem Relation Age of Onset   Hypertension Mother    Diabetes Mother    Cancer Father 68       kidney cancer   Congenital heart disease Brother    Breast cancer Paternal Uncle 82   Hypertension Maternal Grandmother    Diabetes Maternal Grandmother    Cancer Maternal Grandmother        stomach   Hypertension Maternal Grandfather    Diabetes Maternal Grandfather     Current Outpatient Medications:    labetalol (NORMODYNE) 200 MG tablet, Take 1 tablet (200 mg total) by mouth 2 (two) times daily., Disp: 180 tablet, Rfl: 1   metFORMIN (GLUCOPHAGE-XR) 500 MG 24 hr tablet, Take 1 tablet (500 mg total) by mouth daily with breakfast., Disp: 90 tablet, Rfl: 1   methocarbamol (ROBAXIN) 500 MG tablet, Take 1 tablet (500 mg total) by mouth every 8 (eight) hours as needed for muscle spasms., Disp: 30 tablet, Rfl: 0   NIFEdipine (PROCARDIA-XL/NIFEDICAL-XL) 30 MG 24 hr tablet, Take 1 tablet (30 mg total) by  mouth daily., Disp: 90 tablet, Rfl: 0   ondansetron (ZOFRAN-ODT) 4 MG disintegrating tablet, Take 1 tablet (4 mg total) by mouth every 8 (eight) hours as needed for nausea or vomiting., Disp: 30 tablet, Rfl: 1   Prenatal Vit-Fe Fumarate-FA (PRENATAL MULTIVITAMIN) TABS tablet, Take 1 tablet by mouth daily at 12 noon., Disp: , Rfl:  No Known Allergies   ROS: A complete ROS was performed with pertinent positives/negatives noted in the HPI. The  remainder of the ROS are negative.    Objective:   Today's Vitals   07/23/23 1352  BP: 122/82  Pulse: 70  Temp: 98.6 F (37 C)  TempSrc: Temporal  SpO2: 95%  Weight: 263 lb 12.8 oz (119.7 kg)  Height: 5\' 6"  (1.676 m)    GENERAL: Well-appearing, in NAD. Well nourished.  SKIN: Pink, warm and dry. No rash, lesion, ulceration, or ecchymoses.  NECK: Trachea midline. Full ROM w/o pain or tenderness. No lymphadenopathy.  RESPIRATORY: Chest wall symmetrical. Respirations even and non-labored. Breath sounds clear to auscultation bilaterally.  CARDIAC: S1, S2 present, regular rate and rhythm. Peripheral pulses 2+ bilaterally.  MSK: Muscle tone and strength appropriate for age. 5/5 grip strength bilaterally. Negative empty can. FROM of R. Arm and shoulder. Pain with palpation to R. trapezius EXTREMITIES: Without clubbing, cyanosis, or edema.  NEUROLOGIC: No motor or sensory deficits. Steady, even gait.  PSYCH/MENTAL STATUS: Alert, oriented x 3. Cooperative, appropriate mood and affect.    No results found for any visits on 07/23/23.    Assessment & Plan:  Assessment and Plan    Cervical Radiculopathy - Continue Aleve -Start muscle relaxant, Robaxin. -Refer to physical therapy for strengthening exercises and posture correction. -Consider ergonomic assessment at work station. -If no improvement, consider referral to orthopedics.       Meds ordered this encounter  Medications   methocarbamol (ROBAXIN) 500 MG tablet    Sig: Take 1 tablet (500 mg total) by mouth every 8 (eight) hours as needed for muscle spasms.    Dispense:  30 tablet    Refill:  0    Supervising Provider:   Garnette Gunner [[id number]]   Orders Placed This Encounter  Procedures   Ambulatory referral to Physical Therapy    Referral Priority:   Routine    Referral Type:   Physical Medicine    Referral Reason:   Specialty Services Required    Requested Specialty:   Physical Therapy    Number of Visits Requested:    1   Lab Orders  No laboratory test(s) ordered today   No images are attached to the encounter or orders placed in the encounter.  Return if symptoms worsen or fail to improve.   Salvatore Decent, FNP

## 2023-07-26 ENCOUNTER — Ambulatory Visit: Payer: 59 | Attending: Rheumatology | Admitting: Rheumatology

## 2023-07-26 ENCOUNTER — Encounter: Payer: Self-pay | Admitting: Rheumatology

## 2023-07-26 VITALS — BP 120/82 | HR 72 | Resp 14 | Ht 66.0 in | Wt 264.8 lb

## 2023-07-26 DIAGNOSIS — Z87442 Personal history of urinary calculi: Secondary | ICD-10-CM

## 2023-07-26 DIAGNOSIS — R21 Rash and other nonspecific skin eruption: Secondary | ICD-10-CM | POA: Diagnosis not present

## 2023-07-26 DIAGNOSIS — R768 Other specified abnormal immunological findings in serum: Secondary | ICD-10-CM | POA: Diagnosis not present

## 2023-07-26 DIAGNOSIS — I10 Essential (primary) hypertension: Secondary | ICD-10-CM

## 2023-07-26 DIAGNOSIS — K219 Gastro-esophageal reflux disease without esophagitis: Secondary | ICD-10-CM

## 2023-07-26 DIAGNOSIS — J3089 Other allergic rhinitis: Secondary | ICD-10-CM | POA: Diagnosis not present

## 2023-07-26 DIAGNOSIS — N970 Female infertility associated with anovulation: Secondary | ICD-10-CM

## 2023-07-26 DIAGNOSIS — R7303 Prediabetes: Secondary | ICD-10-CM

## 2023-07-26 DIAGNOSIS — M542 Cervicalgia: Secondary | ICD-10-CM | POA: Diagnosis not present

## 2023-07-26 NOTE — Patient Instructions (Signed)
 Cervical Strain and Sprain Rehab Ask your health care provider which exercises are safe for you. Do exercises exactly as told by your health care provider and adjust them as directed. It is normal to feel mild stretching, pulling, tightness, or discomfort as you do these exercises. Stop right away if you feel sudden pain or your pain gets worse. Do not begin these exercises until told by your health care provider. Stretching and range-of-motion exercises Cervical side bending  Using good posture, sit on a stable chair or stand up. Without moving your shoulders, slowly tilt your left / right ear to your shoulder until you feel a stretch in the neck muscles on the opposite side. You should be looking straight ahead. Hold for __________ seconds. Repeat with the other side of your neck. Repeat __________ times. Complete this exercise __________ times a day. Cervical rotation  Using good posture, sit on a stable chair or stand up. Slowly turn your head to the side as if you are looking over your left / right shoulder. Keep your eyes level with the ground. Stop when you feel a stretch along the side and the back of your neck. Hold for __________ seconds. Repeat this by turning to your other side. Repeat __________ times. Complete this exercise __________ times a day. Thoracic extension and pectoral stretch  Roll a towel or a small blanket so it is about 4 inches (10 cm) in diameter. Lie down on your back on a firm surface. Put the towel in the middle of your back across your spine. It should not be under your shoulder blades. Put your hands behind your head and let your elbows fall out to your sides. Hold for __________ seconds. Repeat __________ times. Complete this exercise __________ times a day. Strengthening exercises Upper cervical flexion  Lie on your back with a thin pillow behind your head or a small, rolled-up towel under your neck. Gently tuck your chin toward your chest and nod  your head down to look toward your feet. Do not lift your head off the pillow. Hold for __________ seconds. Release the tension slowly. Relax your neck muscles completely before you repeat this exercise. Repeat __________ times. Complete this exercise __________ times a day. Cervical extension  Stand about 6 inches (15 cm) away from a wall, with your back facing the wall. Place a soft object, about 6-8 inches (15-20 cm) in diameter, between the back of your head and the wall. A soft object could be a small pillow, a ball, or a folded towel. Gently tilt your head back and press into the soft object. Keep your jaw and forehead relaxed. Hold for __________ seconds. Release the tension slowly. Relax your neck muscles completely before you repeat this exercise. Repeat __________ times. Complete this exercise __________ times a day. Posture and body mechanics Body mechanics refer to the movements and positions of your body while you do your daily activities. Posture is part of body mechanics. Good posture and healthy body mechanics can help to relieve stress in your body's tissues and joints. Good posture means that your spine is in its natural S-curve position (your spine is neutral), your shoulders are pulled back slightly, and your head is not tipped forward. The following are general guidelines for using improved posture and body mechanics in your everyday activities. Sitting  When sitting, keep your spine neutral and keep your feet flat on the floor. Use a footrest, if needed, and keep your thighs parallel to the floor. Avoid rounding  your shoulders. Avoid tilting your head forward. When working at a desk or a computer, keep your desk at a height where your hands are slightly lower than your elbows. Slide your chair under your desk so you are close enough to maintain good posture. When working at a computer, place your monitor at a height where you are looking straight ahead and you do not have to  tilt your head forward or downward to look at the screen. Standing  When standing, keep your spine neutral and keep your feet about hip-width apart. Keep a slight bend in your knees. Your ears, shoulders, and hips should line up. When you do a task in which you stand in one place for a long time, place one foot up on a stable object that is 2-4 inches (5-10 cm) high, such as a footstool. This helps keep your spine neutral. Resting When lying down and resting, avoid positions that are most painful for you. Try to support your neck in a neutral position. You can use a contour pillow or a small rolled-up towel. Your pillow should support your neck but not push on it. This information is not intended to replace advice given to you by your health care provider. Make sure you discuss any questions you have with your health care provider. Document Revised: 10/23/2022 Document Reviewed: 01/09/2022 Elsevier Patient Education  2024 [employer].

## 2023-07-26 NOTE — Therapy (Unsigned)
OUTPATIENT PHYSICAL THERAPY CERVICAL EVALUATION   Patient Name: Leslie Arroyo MRN: [id number] DOB:01-Jun-1991, 33 y.o., female Today's Date: 07/30/2023  END OF SESSION:  PT End of Session - 07/30/23 0742     Visit Number 1    Number of Visits 12    Date for PT Re-Evaluation 09/24/23    Authorization Type U[employer]    PT Start Time 1345    PT Stop Time 1430    PT Time Calculation (min) 45 min    Activity Tolerance Patient tolerated treatment well    Behavior During Therapy [facility] for tasks assessed/performed             Past Medical History:  Diagnosis Date   Kidney stones    Prediabetes    Past Surgical History:  Procedure Laterality Date   EXTRACORPOREAL SHOCK WAVE LITHOTRIPSY Left 09/16/2018   Procedure: EXTRACORPOREAL SHOCK WAVE LITHOTRIPSY (ESWL);  Surgeon: Rene Paci, MD;  Location: [facility];  Service: Urology;  Laterality: Left;   HARDWARE REMOVAL Right    leg   KNEE ARTHROSCOPY Left    LEG SURGERY Right    WISDOM TOOTH EXTRACTION     Patient Active Problem List   Diagnosis Date Noted   Positive ANA (antinuclear antibody) 02/08/2023   Rash and other nonspecific skin eruption 01/03/2023   Urticaria 01/03/2023   Other allergic rhinitis 01/03/2023   Elevated blood pressure reading 01/03/2023   Sore throat 11/20/2022   Gastroesophageal reflux disease 07/10/2022   Primary hypertension 05/03/2022   Morbid obesity (HCC) 05/03/2022   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   Prediabetes 05/30/2021   Family history of kidney stones 10/24/2017   History of kidney stones 08/08/2017    PCP: Gerre Scull, NP   REFERRING PROVIDER: Salvatore Decent, FNP  REFERRING DIAG: [phone] (ICD-10-CM) - Cervical radiculopathy  THERAPY DIAG:  Radiculopathy, cervical region  Chronic right shoulder pain  Facial weakness  Rationale for Evaluation and Treatment: Rehabilitation  ONSET DATE: 06/2023  SUBJECTIVE:                                                                                                                                                                                                          SUBJECTIVE STATEMENT: Describes a history or R sided arm pain brought on by prolonged positioning and computer work.  Symptoms described as diffuse N&T as well as paresthesias not following any distinct dermatome.  Symptoms resolved with position changes. Hand dominance: Right  PERTINENT HISTORY:  HPI: Discussed the use of AI scribe software for clinical note  transcription with the patient, who gave verbal consent to proceed.   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 They describe the pain as a dull, tugging sensation that radiates from the back of their neck down to their right arm, occasionally causing numbness. The pain is exacerbated by prolonged computer use and is only partially relieved by dual-action Aleve. The patient has a history of a dislocated right shoulder blade in high school and scoliosis, and has been working extensively on the computer for the past few years. They also report occasional right-sided weakness, particularly noticeable when holding objects like a coffee cup. The patient has tried ibuprofen for pain relief, but it has been ineffective.  PAIN:  Are you having pain? Yes: NPRS scale: 10/10 Pain location: R shoulder/UT Pain description: ache Aggravating factors: computer work Relieving factors: rest, medication  PRECAUTIONS: None  RED FLAGS: None     WEIGHT BEARING RESTRICTIONS: No  FALLS:  Has patient fallen in last 6 months? No  OCCUPATION: Risk analyst  PLOF: Independent  PATIENT GOALS: To mange my symptoms  NEXT MD VISIT: TBD  OBJECTIVE:  Note: Objective measures were completed at Evaluation unless otherwise noted.  DIAGNOSTIC FINDINGS:  none  PATIENT SURVEYS:  FOTO 61(75  predicted)  POSTURE: rounded shoulders and forward head  PALPATION: TTP R UT and Scalenes   CERVICAL ROM:   Active ROM A/PROM (deg) eval  Flexion 90%  Extension 90%  Right lateral flexion 90%  Left lateral flexion 75%  Right rotation 90%  Left rotation 90%   (Blank rows = not tested)  UPPER EXTREMITY ROM: WNL throughout  Active ROM Right eval Left eval  Shoulder flexion    Shoulder extension    Shoulder abduction    Shoulder adduction    Shoulder extension    Shoulder internal rotation    Shoulder external rotation    Elbow flexion    Elbow extension    Wrist flexion    Wrist extension    Wrist ulnar deviation    Wrist radial deviation    Wrist pronation    Wrist supination     (Blank rows = not tested)  UPPER EXTREMITY MMT:  MMT Right eval Left eval  Shoulder flexion    Shoulder extension    Shoulder abduction    Shoulder adduction    Shoulder extension    Shoulder internal rotation    Shoulder external rotation    Middle trapezius    Lower trapezius    Elbow flexion    Elbow extension    Wrist flexion    Wrist extension    Wrist ulnar deviation    Wrist radial deviation    Wrist pronation    Wrist supination    Grip strength/pinch 62/11 58/11       (Blank rows = not tested)  CERVICAL SPECIAL TESTS:  Neck flexor muscle endurance test: Negative, Upper limb tension test (ULTT): Positive, and Spurling's test: Negative  FUNCTIONAL TESTS:  30 seconds chair stand test 14  TREATMENT DATE: 07/28/23 Eval and HEP  PATIENT EDUCATION:  Education details: Discussed eval findings, rehab rationale and POC and patient is in agreement  Person educated: Patient Education method: Explanation Education comprehension: verbalized understanding and needs further education  HOME EXERCISE PROGRAM: Access Code: [email or web address] URL:  [email or web address] Date: 07/27/2023 Prepared by: Gustavus Bryant  Exercises - Standing Shoulder Horizontal Abduction with Resistance  - 1 x daily - 5 x weekly - 2 sets - 10 reps - Seated Upper Trap Stretch  - 1 x daily - 5 x weekly - 1 sets - 2 reps - 30s hold - Seated Cervical Sidebending Stretch  - 1 x daily - 5 x weekly - 1 sets - 2 reps - 30s hold - Standing Shoulder External Rotation with Resistance  - 1 x daily - 5 x weekly - 2 sets - 10 reps  Patient Education - Office Posture  ASSESSMENT:  CLINICAL IMPRESSION: Patient is a 33 y.o. female who was seen today for physical therapy evaluation and treatment for myofacial R UE symptoms brought due to underlying soft tissue restrictions and TrPs in R UT and scalene group. R shoulder ROM WNL and no strength deficits identified.  Cervical ROM only slightly restricted in L SB with ULTT recreating patient symptoms.   OBJECTIVE IMPAIRMENTS: decreased knowledge of condition, decreased ROM, increased fascial restrictions, impaired perceived functional ability, impaired UE functional use, postural dysfunction, and pain.   ACTIVITY LIMITATIONS: carrying, lifting, sitting, and computer work  PERSONAL FACTORS: Age, Past/current experiences, and 1 comorbidity: positive ANA  are also affecting patient's functional outcome.   REHAB POTENTIAL: Good  CLINICAL DECISION MAKING: Stable/uncomplicated  EVALUATION COMPLEXITY: Low   GOALS: Goals reviewed with patient? No  SHORT TERM GOALS: Target date: 08/20/2023  Patient to demonstrate independence in HEP  Baseline: [id number] Goal status: INITIAL    LONG TERM GOALS: Target date: 09/10/2023  Patient will acknowledge 4/10 pain at least once during episode of care   Baseline: 10/10 Goal status: INITIAL  2.  Patient will score at least 75% on FOTO to signify clinically meaningful improvement in functional abilities.   Baseline: 61% Goal status: INITIAL  3.  Increase L SB to  90% Baseline:  Active ROM A/PROM (deg) eval  Flexion 90%  Extension 90%  Right lateral flexion 90%  Left lateral flexion 75%  Right rotation 90%  Left rotation 90%    Goal status: INITIAL  4.  Minimal irritation with RUE ULTT Baseline: Moderate irritation Goal status: INITIAL     PLAN:  PT FREQUENCY: 1-2x/week  PT DURATION: 6 weeks  PLANNED INTERVENTIONS: 97164- PT Re-evaluation, 97110-Therapeutic exercises, 97530- Therapeutic activity, 97112- Neuromuscular re-education, 97535- Self Care, 13086- Manual therapy, Dry Needling, Joint mobilization, and Spinal mobilization  PLAN FOR NEXT SESSION: HEP review and update, manual techniques as appropriate, aerobic tasks, ROM and flexibility activities, strengthening and PREs, TPDN, gait and balance training as needed     Hildred Laser, PT 07/30/2023, 7:43 AM

## 2023-07-27 ENCOUNTER — Other Ambulatory Visit: Payer: Self-pay

## 2023-07-27 ENCOUNTER — Ambulatory Visit: Payer: 59 | Attending: Nurse Practitioner

## 2023-07-27 DIAGNOSIS — R29810 Facial weakness: Secondary | ICD-10-CM | POA: Insufficient documentation

## 2023-07-27 DIAGNOSIS — G8929 Other chronic pain: Secondary | ICD-10-CM | POA: Diagnosis present

## 2023-07-27 DIAGNOSIS — M25511 Pain in right shoulder: Secondary | ICD-10-CM | POA: Diagnosis present

## 2023-07-27 DIAGNOSIS — M5412 Radiculopathy, cervical region: Secondary | ICD-10-CM | POA: Diagnosis present

## 2023-07-27 NOTE — Patient Instructions (Signed)

## 2023-08-14 ENCOUNTER — Ambulatory Visit: Payer: 59 | Admitting: Rheumatology

## 2023-08-15 ENCOUNTER — Other Ambulatory Visit: Payer: Self-pay | Admitting: Nurse Practitioner

## 2023-08-15 NOTE — Telephone Encounter (Signed)
Requesting: NIFEdipine ER Osmotic Release 30 MG Oral Tablet Extended Release 24 Hour  Last Visit: 01/02/2023 Next Visit: Visit date not found Last Refill: 05/21/2023  Please Advise

## 2023-08-21 ENCOUNTER — Other Ambulatory Visit: Payer: Self-pay | Admitting: Internal Medicine

## 2023-08-21 DIAGNOSIS — M5412 Radiculopathy, cervical region: Secondary | ICD-10-CM

## 2023-08-22 MED ORDER — METHOCARBAMOL 500 MG PO TABS: 500.0000 mg | ORAL_TABLET | Freq: Three times a day (TID) | ORAL | 0 refills | Status: AC | PRN

## 2023-08-24 NOTE — Therapy (Signed)
 OUTPATIENT PHYSICAL THERAPY TREATMENT NOTE   Patient Name: Vaishali Baise MRN: [id number] DOB:03/11/1991, 33 y.o., female Today's Date: 08/25/2023  END OF SESSION:  PT End of Session - 08/25/23 4098     Visit Number 2    Number of Visits 12    Date for PT Re-Evaluation 09/24/23    Authorization Type UHC    PT Start Time 0821    PT Stop Time 0859    PT Time Calculation (min) 38 min    Activity Tolerance Patient tolerated treatment well    Behavior During Therapy [facility] for tasks assessed/performed              Past Medical History:  Diagnosis Date   Kidney stones    Prediabetes    Past Surgical History:  Procedure Laterality Date   EXTRACORPOREAL SHOCK WAVE LITHOTRIPSY Left 09/16/2018   Procedure: EXTRACORPOREAL SHOCK WAVE LITHOTRIPSY (ESWL);  Surgeon: Rene Paci, MD;  Location: [facility];  Service: Urology;  Laterality: Left;   HARDWARE REMOVAL Right    leg   KNEE ARTHROSCOPY Left    LEG SURGERY Right    WISDOM TOOTH EXTRACTION     Patient Active Problem List   Diagnosis Date Noted   Positive ANA (antinuclear antibody) 02/08/2023   Rash and other nonspecific skin eruption 01/03/2023   Urticaria 01/03/2023   Other allergic rhinitis 01/03/2023   Elevated blood pressure reading 01/03/2023   Sore throat 11/20/2022   Gastroesophageal reflux disease 07/10/2022   Primary hypertension 05/03/2022   Morbid obesity (HCC) 05/03/2022   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   Prediabetes 05/30/2021   Family history of kidney stones 10/24/2017   History of kidney stones 08/08/2017    PCP: Gerre Scull, NP   REFERRING PROVIDER: Salvatore Decent, FNP  REFERRING DIAG: [phone] (ICD-10-CM) - Cervical radiculopathy  THERAPY DIAG:  Radiculopathy, cervical region  Chronic right shoulder pain  Rationale for Evaluation and Treatment: Rehabilitation  ONSET DATE: 06/2023  SUBJECTIVE:                                                                                                                                                                                                          SUBJECTIVE STATEMENT: Patient reports that she has minimal pain right now, has the worst pain at night. 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   EVAL: Describes a history or R sided arm pain brought on by prolonged positioning and computer work.  Symptoms described as diffuse N&T as well as paresthesias not following  any distinct dermatome.  Symptoms resolved with position changes. Hand dominance: Right  PERTINENT HISTORY:  HPI: Discussed the use of AI scribe software for clinical note transcription with the patient, who gave verbal consent to proceed.   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 They describe the pain as a dull, tugging sensation that radiates from the back of their neck down to their right arm, occasionally causing numbness. The pain is exacerbated by prolonged computer use and is only partially relieved by dual-action Aleve. The patient has a history of a dislocated right shoulder blade in high school and scoliosis, and has been working extensively on the computer for the past few years. They also report occasional right-sided weakness, particularly noticeable when holding objects like a coffee cup. The patient has tried ibuprofen for pain relief, but it has been ineffective.  PAIN:  Are you having pain? Yes: NPRS scale: 10/10 Pain location: R shoulder/UT Pain description: ache Aggravating factors: computer work Relieving factors: rest, medication  PRECAUTIONS: None  RED FLAGS: None     WEIGHT BEARING RESTRICTIONS: No  FALLS:  Has patient fallen in last 6 months? No  OCCUPATION: Risk analyst  PLOF: Independent  PATIENT GOALS: To mange my symptoms  NEXT MD VISIT: TBD  OBJECTIVE:  Note: Objective measures  were completed at Evaluation unless otherwise noted.  DIAGNOSTIC FINDINGS:  none  PATIENT SURVEYS:  FOTO 61(75 predicted)  POSTURE: rounded shoulders and forward head  PALPATION: TTP R UT and Scalenes   CERVICAL ROM:   Active ROM A/PROM (deg) eval  Flexion 90%  Extension 90%  Right lateral flexion 90%  Left lateral flexion 75%  Right rotation 90%  Left rotation 90%   (Blank rows = not tested)  UPPER EXTREMITY ROM: WNL throughout  Active ROM Right eval Left eval  Shoulder flexion    Shoulder extension    Shoulder abduction    Shoulder adduction    Shoulder extension    Shoulder internal rotation    Shoulder external rotation    Elbow flexion    Elbow extension    Wrist flexion    Wrist extension    Wrist ulnar deviation    Wrist radial deviation    Wrist pronation    Wrist supination     (Blank rows = not tested)  UPPER EXTREMITY MMT:  MMT Right eval Left eval  Shoulder flexion    Shoulder extension    Shoulder abduction    Shoulder adduction    Shoulder extension    Shoulder internal rotation    Shoulder external rotation    Middle trapezius    Lower trapezius    Elbow flexion    Elbow extension    Wrist flexion    Wrist extension    Wrist ulnar deviation    Wrist radial deviation    Wrist pronation    Wrist supination    Grip strength/pinch 62/11 58/11       (Blank rows = not tested)  CERVICAL SPECIAL TESTS:  Neck flexor muscle endurance test: Negative, Upper limb tension test (ULTT): Positive, and Spurling's test: Negative  FUNCTIONAL TESTS:  30 seconds chair stand test 14  TREATMENT DATE: [facility] Adult PT Treatment:  DATE: 08/25/23 Therapeutic Exercise: UBE level 1 3'/3' fwd/bwd while gathering subjective and planning session with patient Lat pull down 35# 2x10 High/low rows 35# 2x10 Shoulder extension BlueTB 2x10 Seated BIL ER with scap retraction RTB x10 Median nerve glide tray  x15 Seated chin tucks x15 Self Care: [employer] self instruction Office posture and desk ergonomics   07/28/23 Eval and HEP                                                                                                                                 PATIENT EDUCATION:  Education details: Discussed eval findings, rehab rationale and POC and patient is in agreement  Person educated: Patient Education method: Explanation Education comprehension: verbalized understanding and needs further education  HOME EXERCISE PROGRAM: Access Code: [email or web address] URL: [email or web address] Date: 07/27/2023 Prepared by: Gustavus Bryant  Exercises - Standing Shoulder Horizontal Abduction with Resistance  - 1 x daily - 5 x weekly - 2 sets - 10 reps - Seated Upper Trap Stretch  - 1 x daily - 5 x weekly - 1 sets - 2 reps - 30s hold - Seated Cervical Sidebending Stretch  - 1 x daily - 5 x weekly - 1 sets - 2 reps - 30s hold - Standing Shoulder External Rotation with Resistance  - 1 x daily - 5 x weekly - 2 sets - 10 reps  Patient Education - Office Posture  ASSESSMENT:  CLINICAL IMPRESSION: Patient presents to first follow up PT session reporting minimal shoulder pain this morning and that most of her pain occurs in the evening/at the end of the day. Session today focused on periscapular strengthening and improving postural awareness along with continued discussion of desk ergonomics and office work related posture. Patient was able to tolerate all prescribed exercises with no adverse effects. Patient continues to benefit from skilled PT services and should be progressed as able to improve functional independence.   EVAL: Patient is a 33 y.o. female who was seen today for physical therapy evaluation and treatment for myofacial R UE symptoms brought due to underlying soft tissue restrictions and TrPs in R UT and scalene group. R shoulder ROM WNL and no strength deficits identified.  Cervical ROM  only slightly restricted in L SB with ULTT recreating patient symptoms.   OBJECTIVE IMPAIRMENTS: decreased knowledge of condition, decreased ROM, increased fascial restrictions, impaired perceived functional ability, impaired UE functional use, postural dysfunction, and pain.   ACTIVITY LIMITATIONS: carrying, lifting, sitting, and computer work  PERSONAL FACTORS: Age, Past/current experiences, and 1 comorbidity: positive ANA  are also affecting patient's functional outcome.   REHAB POTENTIAL: Good  CLINICAL DECISION MAKING: Stable/uncomplicated  EVALUATION COMPLEXITY: Low   GOALS: Goals reviewed with patient? No  SHORT TERM GOALS: Target date: 08/20/2023  Patient to demonstrate independence in HEP  Baseline: [id number] Goal status: INITIAL    LONG TERM GOALS: Target date: 09/10/2023  Patient will acknowledge 4/10 pain at least once during  episode of care   Baseline: 10/10 Goal status: INITIAL  2.  Patient will score at least 75% on FOTO to signify clinically meaningful improvement in functional abilities.   Baseline: 61% Goal status: INITIAL  3.  Increase L SB to 90% Baseline:  Active ROM A/PROM (deg) eval  Flexion 90%  Extension 90%  Right lateral flexion 90%  Left lateral flexion 75%  Right rotation 90%  Left rotation 90%    Goal status: INITIAL  4.  Minimal irritation with RUE ULTT Baseline: Moderate irritation Goal status: INITIAL     PLAN:  PT FREQUENCY: 1-2x/week  PT DURATION: 6 weeks  PLANNED INTERVENTIONS: 97164- PT Re-evaluation, 97110-Therapeutic exercises, 97530- Therapeutic activity, 97112- Neuromuscular re-education, 97535- Self Care, 16109- Manual therapy, Dry Needling, Joint mobilization, and Spinal mobilization  PLAN FOR NEXT SESSION: HEP review and update, manual techniques as appropriate, aerobic tasks, ROM and flexibility activities, strengthening and PREs, TPDN, gait and balance training as needed     Berta Minor,  PTA 08/25/2023, 9:00 AM

## 2023-08-25 ENCOUNTER — Ambulatory Visit: Payer: 59 | Attending: Internal Medicine

## 2023-08-25 DIAGNOSIS — M25511 Pain in right shoulder: Secondary | ICD-10-CM | POA: Diagnosis present

## 2023-08-25 DIAGNOSIS — M5412 Radiculopathy, cervical region: Secondary | ICD-10-CM | POA: Diagnosis present

## 2023-08-25 DIAGNOSIS — G8929 Other chronic pain: Secondary | ICD-10-CM | POA: Diagnosis present

## 2023-09-01 ENCOUNTER — Ambulatory Visit: Payer: 59 | Attending: Internal Medicine

## 2023-09-01 DIAGNOSIS — M25511 Pain in right shoulder: Secondary | ICD-10-CM | POA: Insufficient documentation

## 2023-09-01 DIAGNOSIS — M5412 Radiculopathy, cervical region: Secondary | ICD-10-CM | POA: Diagnosis present

## 2023-09-01 DIAGNOSIS — M6281 Muscle weakness (generalized): Secondary | ICD-10-CM | POA: Diagnosis present

## 2023-09-01 DIAGNOSIS — G8929 Other chronic pain: Secondary | ICD-10-CM | POA: Insufficient documentation

## 2023-09-01 NOTE — Therapy (Signed)
 OUTPATIENT PHYSICAL THERAPY TREATMENT NOTE   Patient Name: Leslie Arroyo MRN: [id number] DOB:08-Aug-1990, 33 y.o., female Today's Date: 09/01/2023  END OF SESSION:  PT End of Session - 09/01/23 0904     Visit Number 3    Number of Visits 12    Date for PT Re-Evaluation 09/24/23    Authorization Type [employer]    Authorization Time Period VL 20    Authorization - Visit Number 2    Authorization - Number of Visits 20    PT Start Time 0904    PT Stop Time 0947    PT Time Calculation (min) 43 min    Activity Tolerance Patient tolerated treatment well    Behavior During Therapy [facility] for tasks assessed/performed               Past Medical History:  Diagnosis Date   Kidney stones    Prediabetes    Past Surgical History:  Procedure Laterality Date   EXTRACORPOREAL SHOCK WAVE LITHOTRIPSY Left 09/16/2018   Procedure: EXTRACORPOREAL SHOCK WAVE LITHOTRIPSY (ESWL);  Surgeon: Rene Paci, MD;  Location: [facility];  Service: Urology;  Laterality: Left;   HARDWARE REMOVAL Right    leg   KNEE ARTHROSCOPY Left    LEG SURGERY Right    WISDOM TOOTH EXTRACTION     Patient Active Problem List   Diagnosis Date Noted   Positive ANA (antinuclear antibody) 02/08/2023   Rash and other nonspecific skin eruption 01/03/2023   Urticaria 01/03/2023   Other allergic rhinitis 01/03/2023   Elevated blood pressure reading 01/03/2023   Sore throat 11/20/2022   Gastroesophageal reflux disease 07/10/2022   Primary hypertension 05/03/2022   Morbid obesity (HCC) 05/03/2022   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   Prediabetes 05/30/2021   Family history of kidney stones 10/24/2017   History of kidney stones 08/08/2017    PCP: Gerre Scull, NP   REFERRING PROVIDER: Salvatore Decent, FNP  REFERRING DIAG: [phone] (ICD-10-CM) - Cervical radiculopathy  THERAPY DIAG:  Radiculopathy, cervical region  Chronic right shoulder pain  Rationale for Evaluation and Treatment:  Rehabilitation  ONSET DATE: 06/2023  SUBJECTIVE:                                                                                                                                                                                                         SUBJECTIVE STATEMENT: Patient reports that so far things are about the same. Patient states that last night it hurt more and it might partially caused by her new job.    EVAL:  Describes a history or R sided arm pain brought on by prolonged positioning and computer work.  Symptoms described as diffuse N&T as well as paresthesias not following any distinct dermatome.  Symptoms resolved with position changes. Hand dominance: Right  PERTINENT HISTORY:  HPI: Discussed the use of AI scribe software for clinical note transcription with the patient, who gave verbal consent to proceed.   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 They describe the pain as a dull, tugging sensation that radiates from the back of their neck down to their right arm, occasionally causing numbness. The pain is exacerbated by prolonged computer use and is only partially relieved by dual-action Aleve. The patient has a history of a dislocated right shoulder blade in high school and scoliosis, and has been working extensively on the computer for the past few years. They also report occasional right-sided weakness, particularly noticeable when holding objects like a coffee cup. The patient has tried ibuprofen for pain relief, but it has been ineffective.  PAIN:  Are you having pain? Yes: NPRS scale: 10/10 Pain location: R shoulder/UT Pain description: ache Aggravating factors: computer work Relieving factors: rest, medication  PRECAUTIONS: None  RED FLAGS: None     WEIGHT BEARING RESTRICTIONS: No  FALLS:  Has patient fallen in last 6 months? No  OCCUPATION: Advertising account executive  PLOF: Independent  PATIENT GOALS: To mange my symptoms  NEXT MD VISIT: TBD  OBJECTIVE:  Note: Objective measures were completed at Evaluation unless otherwise noted.  DIAGNOSTIC FINDINGS:  none  PATIENT SURVEYS:  FOTO 61(75 predicted)  POSTURE: rounded shoulders and forward head  PALPATION: TTP R UT and Scalenes   CERVICAL ROM:   Active ROM A/PROM (deg) eval  Flexion 90%  Extension 90%  Right lateral flexion 90%  Left lateral flexion 75%  Right rotation 90%  Left rotation 90%   (Blank rows = not tested)  UPPER EXTREMITY ROM: WNL throughout  Active ROM Right eval Left eval  Shoulder flexion    Shoulder extension    Shoulder abduction    Shoulder adduction    Shoulder extension    Shoulder internal rotation    Shoulder external rotation    Elbow flexion    Elbow extension    Wrist flexion    Wrist extension    Wrist ulnar deviation    Wrist radial deviation    Wrist pronation    Wrist supination     (Blank rows = not tested)  UPPER EXTREMITY MMT:  MMT Right eval Left eval  Shoulder flexion    Shoulder extension    Shoulder abduction    Shoulder adduction    Shoulder extension    Shoulder internal rotation    Shoulder external rotation    Middle trapezius    Lower trapezius    Elbow flexion    Elbow extension    Wrist flexion    Wrist extension    Wrist ulnar deviation    Wrist radial deviation    Wrist pronation    Wrist supination    Grip strength/pinch 62/11 58/11       (Blank rows = not tested)  CERVICAL SPECIAL TESTS:  Neck flexor muscle endurance test: Negative, Upper limb tension test (ULTT): Positive, and Spurling's test: Negative  FUNCTIONAL TESTS:  30 seconds chair stand test 14  TREATMENT DATE: [facility] Adult PT Treatment:  09/01/2023 Therapeutic Exercise: UBE Level 1 2'/2' fwd/bwd 15 reps cervical extension SNAGs with towel 3x10 cable column pull downs 7lbs Self  STM tennis ball to rhomboids and upper trap x3 minutes  Manual Therapy STM to right upper trap, suboccipital, and forearm Median nerve glides  Neuro Re-ed 3x10 horizontal abduction with chin tuck for postural re-ed (RTB) 10 reps shoulder wall clocks 500 g ball for neuromotor endurance 3x10 cable column rows 13lbs for postural stability  DATE: 08/25/23 Therapeutic Exercise: UBE level 1 3'/3' fwd/bwd while gathering subjective and planning session with patient Lat pull down 35# 2x10 High/low rows 35# 2x10 Shoulder extension BlueTB 2x10 Seated BIL ER with scap retraction RTB x10 Median nerve glide tray x15 Seated chin tucks x15 Self Care: Theracane self instruction Office posture and desk ergonomics   07/28/23 Eval and HEP                                                                                                                                 PATIENT EDUCATION:  Education details: Discussed eval findings, rehab rationale and POC and patient is in agreement  Person educated: Patient Education method: Explanation Education comprehension: verbalized understanding and needs further education  HOME EXERCISE PROGRAM: Access Code: [email or web address] URL: [email or web address] Date: 07/27/2023 Prepared by: Gustavus Bryant  Exercises - Standing Shoulder Horizontal Abduction with Resistance  - 1 x daily - 5 x weekly - 2 sets - 10 reps - Seated Upper Trap Stretch  - 1 x daily - 5 x weekly - 1 sets - 2 reps - 30s hold - Seated Cervical Sidebending Stretch  - 1 x daily - 5 x weekly - 1 sets - 2 reps - 30s hold - Standing Shoulder External Rotation with Resistance  - 1 x daily - 5 x weekly - 2 sets - 10 reps  Patient Education - Office Posture  ASSESSMENT:  CLINICAL IMPRESSION: Patient arrives to therapy with mild pain at rest. Patient presents with significant soft tissue restrictions and point tenderness of right upper trap and suboccipital. Patient responds well to manual  therapy and median nerve glides reporting less pain afterwards. Patient tolerates progression of postural control and neuromotor endurance exercises with minimal cueing required. Patient reports overall improvement in symptoms after session. Patient continues to benefit from skilled PT services and should be progressed as able to improve functional independence.   EVAL: Patient is a 33 y.o. female who was seen today for physical therapy evaluation and treatment for myofacial R UE symptoms brought due to underlying soft tissue restrictions and TrPs in R UT and scalene group. R shoulder ROM WNL and no strength deficits identified.  Cervical ROM only slightly restricted in L SB with ULTT recreating patient symptoms.   OBJECTIVE IMPAIRMENTS: decreased knowledge of condition, decreased ROM, increased fascial restrictions, impaired perceived functional ability, impaired UE functional use, postural dysfunction, and pain.   ACTIVITY LIMITATIONS: carrying, lifting, sitting, and computer work  PERSONAL  FACTORS: Age, Past/current experiences, and 1 comorbidity: positive ANA  are also affecting patient's functional outcome.   REHAB POTENTIAL: Good  CLINICAL DECISION MAKING: Stable/uncomplicated  EVALUATION COMPLEXITY: Low   GOALS: Goals reviewed with patient? No  SHORT TERM GOALS: Target date: 08/20/2023  Patient to demonstrate independence in HEP  Baseline: [id number] Goal status: INITIAL    LONG TERM GOALS: Target date: 09/10/2023  Patient will acknowledge 4/10 pain at least once during episode of care   Baseline: 10/10 Goal status: INITIAL  2.  Patient will score at least 75% on FOTO to signify clinically meaningful improvement in functional abilities.   Baseline: 61% Goal status: INITIAL  3.  Increase L SB to 90% Baseline:  Active ROM A/PROM (deg) eval  Flexion 90%  Extension 90%  Right lateral flexion 90%  Left lateral flexion 75%  Right rotation 90%  Left rotation 90%    Goal  status: INITIAL  4.  Minimal irritation with RUE ULTT Baseline: Moderate irritation Goal status: INITIAL     PLAN:  PT FREQUENCY: 1-2x/week  PT DURATION: 6 weeks  PLANNED INTERVENTIONS: 97164- PT Re-evaluation, 97110-Therapeutic exercises, 97530- Therapeutic activity, 97112- Neuromuscular re-education, 97535- Self Care, 40102- Manual therapy, Dry Needling, Joint mobilization, and Spinal mobilization  PLAN FOR NEXT SESSION: HEP review and update, manual techniques as appropriate, aerobic tasks, ROM and flexibility activities, strengthening and PREs, TPDN, gait and balance training as needed     Erskine Emery Kristy Catoe, PT 09/01/2023, 9:51 AM

## 2023-09-04 ENCOUNTER — Encounter: Payer: Self-pay | Admitting: Nurse Practitioner

## 2023-09-04 ENCOUNTER — Ambulatory Visit: Payer: 59 | Admitting: Nurse Practitioner

## 2023-09-04 VITALS — BP 128/90 | HR 73 | Temp 97.6°F | Ht 66.0 in | Wt 260.6 lb

## 2023-09-04 DIAGNOSIS — Z23 Encounter for immunization: Secondary | ICD-10-CM

## 2023-09-04 DIAGNOSIS — Z6841 Body Mass Index (BMI) 40.0 and over, adult: Secondary | ICD-10-CM | POA: Diagnosis not present

## 2023-09-04 DIAGNOSIS — R0683 Snoring: Secondary | ICD-10-CM

## 2023-09-04 DIAGNOSIS — R7303 Prediabetes: Secondary | ICD-10-CM | POA: Diagnosis not present

## 2023-09-04 LAB — POCT GLYCOSYLATED HEMOGLOBIN (HGB A1C)
HbA1c POC (<> result, manual entry): 5.9 % (ref 4.0–5.6)
HbA1c, POC (controlled diabetic range): 5.9 % (ref 0.0–7.0)
HbA1c, POC (prediabetic range): 5.9 % (ref 5.7–6.4)
Hemoglobin A1C: 5.9 % — AB (ref 4.0–5.6)

## 2023-09-04 MED ORDER — ZEPBOUND 2.5 MG/0.5ML ~~LOC~~ SOAJ: 2.5000 mg | SUBCUTANEOUS | 1 refills | Status: DC

## 2023-09-04 MED ORDER — ONDANSETRON 4 MG PO TBDP: 4.0000 mg | ORAL_TABLET | Freq: Three times a day (TID) | ORAL | 1 refills | Status: DC | PRN

## 2023-09-04 NOTE — Assessment & Plan Note (Signed)
 BMI 42. Discussed nutrition and exercise. She is interested in medication to help with weight loss. Will start zepbound 2.5mg  injection weekly. Discussed possible side effects. Continue with low carb diet and exercise. Follow-up in 6 weeks.

## 2023-09-04 NOTE — Assessment & Plan Note (Addendum)
 Chronic, stable. A1c remains stable at 5.9 from a year ago. Currently taking Metformin without side effects. Interested in switching to Zepbound for weight loss. Check insurance coverage for Zepbound; if approved, start on a 2.5mg  injection weekly and gradually increase. Discussed possible side effects. Focus on nutrition and exercise. If not approved, continue Metformin XR 500mg  daily to control blood sugar.

## 2023-09-04 NOTE — Progress Notes (Signed)
 Established Patient Office Visit  Subjective   Patient ID: Leslie Arroyo, female    DOB: Nov 10, 1990  Age: 33 y.o. MRN: [id number]  Chief Complaint  Patient presents with   Medication Management    Discuss changing metformin and start on mounjaro, Rx Refill, concerns with snoring    HPI  Discussed the use of AI scribe software for clinical note transcription with the patient, who gave verbal consent to proceed.  History of Present Illness   The patient, with a history of prediabetes managed with Metformin, presents with concerns about snoring and episodes of apnea during sleep. These episodes were reported by friends during a recent vacation, who expressed concern about the patient's breathing patterns during sleep. The patient acknowledges that she is aware of her snoring issue and admits to sleeping with her face in the pillow, although she does not start in this position. The patient also reports experiencing fatigue during the day, particularly when moving from point A to point B, but feels well-rested upon waking up in the morning and denies any morning headaches.  In addition to the sleep concerns, the patient discusses her ongoing physical therapy for shoulder issues, which has been going well. However, she experienced significant discomfort after a recent massage session, likening the feeling to being in a car crash. 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  Regarding her medication, the patient is on Metformin for prediabetes and has been taking the full dose for the last couple of months without noticing any side effects. Initially, the medication caused significant bowel movements, but this effect has since subsided, leading the patient to question the medication's current impact and consider other options.        ROS See pertinent positives and negatives per  HPI.    Objective:     BP (!) 128/90 (BP Location: Right Arm, Patient Position: Sitting, Cuff Size: Large)   Pulse 73   Temp 97.6 F (36.4 C)   Ht 5\' 6"  (1.676 m)   Wt 260 lb 9.6 oz (118.2 kg)   LMP 08/23/2023 (Exact Date)   SpO2 99%   BMI 42.06 kg/m    Physical Exam Vitals and nursing note reviewed.  Constitutional:      General: She is not in acute distress.    Appearance: Normal appearance.  HENT:     Head: Normocephalic.  Eyes:     Conjunctiva/sclera: Conjunctivae normal.  Cardiovascular:     Rate and Rhythm: Normal rate and regular rhythm.     Pulses: Normal pulses.     Heart sounds: Normal heart sounds.  Pulmonary:     Effort: Pulmonary effort is normal.     Breath sounds: Normal breath sounds.  Musculoskeletal:     Cervical back: Normal range of motion.  Skin:    General: Skin is warm.  Neurological:     General: No focal deficit present.     Mental Status: She is alert and oriented to person, place, and time.  Psychiatric:        Mood and Affect: Mood normal.        Behavior: Behavior normal.        Thought Content: Thought content normal.        Judgment: Judgment normal.      Results for orders placed or performed in visit on 09/04/23  POCT glycosylated hemoglobin (Hb A1C)  Result Value Ref Range   Hemoglobin A1C 5.9 (A) 4.0 - 5.6 %   HbA1c POC (<> result, manual entry) 5.9 4.0 - 5.6 %   HbA1c, POC (prediabetic range) 5.9 5.7 - 6.4 %   HbA1c, POC (controlled diabetic range) 5.9 0.0 - 7.0 %      The ASCVD Risk score (Arnett DK, et al., 2019) failed to calculate for the following reasons:   The 2019 ASCVD risk score is only valid for ages 44 to 24    Assessment & Plan:   Problem List Items Addressed This Visit       Other   Prediabetes - Primary   Chronic, stable. A1c remains stable at 5.9 from a year ago. Currently taking Metformin without side effects. Interested in switching to Zepbound for weight loss. Check insurance coverage for  Zepbound; if approved, start on a 2.5mg  injection weekly and gradually increase. Discussed possible side effects. Focus on nutrition and exercise. If not approved, continue Metformin XR 500mg  daily to control blood sugar.       Relevant Orders   POCT glycosylated hemoglobin (Hb A1C) (Completed)   Morbid obesity (HCC)   BMI 42. Discussed nutrition and exercise. She is interested in medication to help with weight loss. Will start zepbound 2.5mg  injection weekly. Discussed possible side effects. Continue with low carb diet and exercise. Follow-up in 6 weeks.       Relevant Medications   tirzepatide (ZEPBOUND) 2.5 MG/0.5ML Pen   Other Visit Diagnoses       Snoring       Snoring with periods of apnea per her friends. Will place referral to sleep medicine for sleep study.   Relevant Orders   Ambulatory referral to Sleep Studies     Immunization due       Flu vaccine given today.   Relevant Orders   Flu vaccine trivalent PF, 6mos and older(Flulaval,Afluria,Fluarix,Fluzone) (Completed)      Return in about 6 weeks (around 10/16/2023) for zepbound.    Gerre Scull, NP

## 2023-09-04 NOTE — Patient Instructions (Signed)
 It was great to see you!  I have placed a referral to sleep medicine, they will call to schedule an appointment  Start zepbound injection once a week. Make sure you are drinking plenty of water  If your insurance approves zepbound, you can stop metformin, otherwise I would still take it  Let's follow-up in 6 weeks, sooner if you have concerns.  If a referral was placed today, you will be contacted for an appointment. Please note that routine referrals can sometimes take up to 3-4 weeks to process. Please call our office if you haven't heard anything after this time frame.  Take care,  Rodman Pickle, NP

## 2023-09-06 ENCOUNTER — Telehealth: Payer: Self-pay

## 2023-09-06 ENCOUNTER — Other Ambulatory Visit (HOSPITAL_COMMUNITY): Payer: Self-pay

## 2023-09-06 NOTE — Telephone Encounter (Signed)
 Pharmacy Patient Advocate Encounter   Received notification from Onbase that prior authorization for Zepbound 2.5MG /0.5ML pen-injectors is required/requested.   Insurance verification completed.   The patient is insured through [facility] .   Per test claim: PA required; PA submitted to above mentioned insurance via CoverMyMeds Key/confirmation #/EOC [facility] Status is pending

## 2023-09-06 NOTE — Telephone Encounter (Signed)
 Pharmacy Patient Advocate Encounter  Received notification from [facility] that Prior Authorization for Zepbound 2.5MG /0.5ML pen-injectors has been APPROVED from 09/06/23 to 03/08/24. Ran test claim, Copay is $24.99. This test claim was processed through [facility]- copay amounts may vary at other pharmacies due to pharmacy/plan contracts, or as the patient moves through the different stages of their insurance plan.   PA #/Case ID/Reference #: [id number]

## 2023-09-08 ENCOUNTER — Ambulatory Visit: Payer: 59

## 2023-09-08 DIAGNOSIS — G8929 Other chronic pain: Secondary | ICD-10-CM

## 2023-09-08 DIAGNOSIS — M5412 Radiculopathy, cervical region: Secondary | ICD-10-CM | POA: Diagnosis not present

## 2023-09-08 NOTE — Therapy (Signed)
 OUTPATIENT PHYSICAL THERAPY TREATMENT NOTE   Patient Name: Leslie Arroyo MRN: [id number] DOB:10-11-1990, 33 y.o., female Today's Date: 09/08/2023  END OF SESSION:  PT End of Session - 09/08/23 0900     Visit Number 4    Number of Visits 12    Date for PT Re-Evaluation 09/24/23    Authorization Type U[employer]    Authorization Time Period VL 20    Authorization - Visit Number 3    Authorization - Number of Visits 20    PT Start Time 0900    PT Stop Time 0940    PT Time Calculation (min) 40 min    Activity Tolerance Patient tolerated treatment well    Behavior During Therapy WFL for tasks assessed/performed             Past Medical History:  Diagnosis Date   Kidney stones    Prediabetes    Past Surgical History:  Procedure Laterality Date   EXTRACORPOREAL SHOCK WAVE LITHOTRIPSY Left 09/16/2018   Procedure: EXTRACORPOREAL SHOCK WAVE LITHOTRIPSY (ESWL);  Surgeon: Rene Paci, MD;  Location: [facility];  Service: Urology;  Laterality: Left;   HARDWARE REMOVAL Right    leg   KNEE ARTHROSCOPY Left    LEG SURGERY Right    WISDOM TOOTH EXTRACTION     Patient Active Problem List   Diagnosis Date Noted   Positive ANA (antinuclear antibody) 02/08/2023   Rash and other nonspecific skin eruption 01/03/2023   Urticaria 01/03/2023   Other allergic rhinitis 01/03/2023   Elevated blood pressure reading 01/03/2023   Sore throat 11/20/2022   Gastroesophageal reflux disease 07/10/2022   Primary hypertension 05/03/2022   Morbid obesity (HCC) 05/03/2022   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   Prediabetes 05/30/2021   Family history of kidney stones 10/24/2017   History of kidney stones 08/08/2017    PCP: Gerre Scull, NP   REFERRING PROVIDER: Salvatore Decent, FNP  REFERRING DIAG: [phone] (ICD-10-CM) - Cervical radiculopathy  THERAPY DIAG:  Radiculopathy, cervical region  Chronic right shoulder pain  Rationale for Evaluation and Treatment:  Rehabilitation  ONSET DATE: 06/2023  SUBJECTIVE:                                                                                                                                                                                                         SUBJECTIVE STATEMENT: Patient reports that her pain has improved and that she notices most of her pain at the end of the work day. Her new job is going well and got a new chair  this week which is more ergonomic.   EVAL: Describes a history or R sided arm pain brought on by prolonged positioning and computer work.  Symptoms described as diffuse N&T as well as paresthesias not following any distinct dermatome.  Symptoms resolved with position changes. Hand dominance: Right  PERTINENT HISTORY:  HPI: Discussed the use of AI scribe software for clinical note transcription with the patient, who gave verbal consent to proceed.   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 They describe the pain as a dull, tugging sensation that radiates from the back of their neck down to their right arm, occasionally causing numbness. The pain is exacerbated by prolonged computer use and is only partially relieved by dual-action Aleve. The patient has a history of a dislocated right shoulder blade in high school and scoliosis, and has been working extensively on the computer for the past few years. They also report occasional right-sided weakness, particularly noticeable when holding objects like a coffee cup. The patient has tried ibuprofen for pain relief, but it has been ineffective.  PAIN:  Are you having pain? Yes: NPRS scale: 10/10 Pain location: R shoulder/UT Pain description: ache Aggravating factors: computer work Relieving factors: rest, medication  PRECAUTIONS: None  RED FLAGS: None     WEIGHT BEARING RESTRICTIONS: No  FALLS:  Has patient fallen in last  6 months? No  OCCUPATION: Risk analyst  PLOF: Independent  PATIENT GOALS: To mange my symptoms  NEXT MD VISIT: TBD  OBJECTIVE:  Note: Objective measures were completed at Evaluation unless otherwise noted.  DIAGNOSTIC FINDINGS:  none  PATIENT SURVEYS:  FOTO 61(75 predicted)  POSTURE: rounded shoulders and forward head  PALPATION: TTP R UT and Scalenes   CERVICAL ROM:   Active ROM A/PROM (deg) eval  Flexion 90%  Extension 90%  Right lateral flexion 90%  Left lateral flexion 75%  Right rotation 90%  Left rotation 90%   (Blank rows = not tested)  UPPER EXTREMITY ROM: WNL throughout  Active ROM Right eval Left eval  Shoulder flexion    Shoulder extension    Shoulder abduction    Shoulder adduction    Shoulder extension    Shoulder internal rotation    Shoulder external rotation    Elbow flexion    Elbow extension    Wrist flexion    Wrist extension    Wrist ulnar deviation    Wrist radial deviation    Wrist pronation    Wrist supination     (Blank rows = not tested)  UPPER EXTREMITY MMT:  MMT Right eval Left eval  Shoulder flexion    Shoulder extension    Shoulder abduction    Shoulder adduction    Shoulder extension    Shoulder internal rotation    Shoulder external rotation    Middle trapezius    Lower trapezius    Elbow flexion    Elbow extension    Wrist flexion    Wrist extension    Wrist ulnar deviation    Wrist radial deviation    Wrist pronation    Wrist supination    Grip strength/pinch 62/11 58/11       (Blank rows = not tested)  CERVICAL SPECIAL TESTS:  Neck flexor muscle endurance test: Negative, Upper limb tension test (ULTT): Positive, and Spurling's test: Negative  FUNCTIONAL TESTS:  30 seconds chair stand test 14  TREATMENT DATE: [facility]  PT Treatment:                                                DATE: 09/08/23 Therapeutic Exercise: UBE Level 1 3'/3' fwd/bwd 3x10 cable column pull downs 2 cables 7lbs  ea Neuromuscular re-ed: 10 reps shoulder wall clocks 500 g ball for neuromotor endurance Seated horizontal abd with chin tuck RTB 2x10 Median nerve glide tray x20 Rt 3x10 cable column rows 2 cable x10lbs ea for postural stability Back against wall 500g ball hand off abduction x10 Supine on foam roller x10 ea: protraction/retraction, alternating flexion/extension, circles CW/CCW, horizontal abduction RTB Supine pec stretch on foam roller x30"   [facility] Adult PT Treatment:                                                 09/01/2023 Therapeutic Exercise: UBE Level 1 2'/2' fwd/bwd 15 reps cervical extension SNAGs with towel 3x10 cable column pull downs 7lbs Self STM tennis ball to rhomboids and upper trap x3 minutes  Manual Therapy STM to right upper trap, suboccipital, and forearm Median nerve glides  Neuro Re-ed 3x10 horizontal abduction with chin tuck for postural re-ed (RTB) 10 reps shoulder wall clocks 500 g ball for neuromotor endurance 3x10 cable column rows 13lbs for postural stability  DATE: 08/25/23 Therapeutic Exercise: UBE level 1 3'/3' fwd/bwd while gathering subjective and planning session with patient Lat pull down 35# 2x10 High/low rows 35# 2x10 Shoulder extension BlueTB 2x10 Seated BIL ER with scap retraction RTB x10 Median nerve glide tray x15 Seated chin tucks x15 Self Care: Theracane self instruction Office posture and desk ergonomics     PATIENT EDUCATION:  Education details: Discussed eval findings, rehab rationale and POC and patient is in agreement  Person educated: Patient Education method: Explanation Education comprehension: verbalized understanding and needs further education  HOME EXERCISE PROGRAM: Access Code: [email or web address] URL: [email or web address] Date: 07/27/2023 Prepared by: Gustavus Bryant  Exercises - Standing Shoulder Horizontal Abduction with Resistance  - 1 x daily - 5 x weekly - 2 sets - 10 reps - Seated Upper Trap Stretch   - 1 x daily - 5 x weekly - 1 sets - 2 reps - 30s hold - Seated Cervical Sidebending Stretch  - 1 x daily - 5 x weekly - 1 sets - 2 reps - 30s hold - Standing Shoulder External Rotation with Resistance  - 1 x daily - 5 x weekly - 2 sets - 10 reps  Patient Education - Office Posture  ASSESSMENT:  CLINICAL IMPRESSION: Patient presents to PT reporting continued improvements in her pain. Session today continued to focus on periscapular strengthening and improving postural awareness. Patient was able to tolerate all prescribed exercises with no adverse effects. Patient continues to benefit from skilled PT services and should be progressed as able to improve functional independence.   EVAL: Patient is a 33 y.o. female who was seen today for physical therapy evaluation and treatment for myofacial R UE symptoms brought due to underlying soft tissue restrictions and TrPs in R UT and scalene group. R shoulder ROM WNL and no strength deficits identified.  Cervical ROM only slightly restricted in L SB with ULTT recreating patient symptoms.   OBJECTIVE IMPAIRMENTS: decreased  knowledge of condition, decreased ROM, increased fascial restrictions, impaired perceived functional ability, impaired UE functional use, postural dysfunction, and pain.   ACTIVITY LIMITATIONS: carrying, lifting, sitting, and computer work  PERSONAL FACTORS: Age, Past/current experiences, and 1 comorbidity: positive ANA  are also affecting patient's functional outcome.   REHAB POTENTIAL: Good  CLINICAL DECISION MAKING: Stable/uncomplicated  EVALUATION COMPLEXITY: Low   GOALS: Goals reviewed with patient? No  SHORT TERM GOALS: Target date: 08/20/2023  Patient to demonstrate independence in HEP  Baseline: [id number] Goal status: INITIAL    LONG TERM GOALS: Target date: 09/10/2023  Patient will acknowledge 4/10 pain at least once during episode of care   Baseline: 10/10 Goal status: INITIAL  2.  Patient will score at  least 75% on FOTO to signify clinically meaningful improvement in functional abilities.   Baseline: 61% Goal status: INITIAL  3.  Increase L SB to 90% Baseline:  Active ROM A/PROM (deg) eval  Flexion 90%  Extension 90%  Right lateral flexion 90%  Left lateral flexion 75%  Right rotation 90%  Left rotation 90%    Goal status: INITIAL  4.  Minimal irritation with RUE ULTT Baseline: Moderate irritation Goal status: INITIAL     PLAN:  PT FREQUENCY: 1-2x/week  PT DURATION: 6 weeks  PLANNED INTERVENTIONS: 97164- PT Re-evaluation, 97110-Therapeutic exercises, 97530- Therapeutic activity, 97112- Neuromuscular re-education, 97535- Self Care, 29528- Manual therapy, Dry Needling, Joint mobilization, and Spinal mobilization  PLAN FOR NEXT SESSION: HEP review and update, manual techniques as appropriate, aerobic tasks, ROM and flexibility activities, strengthening and PREs, TPDN, gait and balance training as needed     Berta Minor, PTA 09/08/2023, 9:39 AM

## 2023-09-15 ENCOUNTER — Ambulatory Visit: Payer: 59

## 2023-09-15 DIAGNOSIS — M5412 Radiculopathy, cervical region: Secondary | ICD-10-CM | POA: Diagnosis not present

## 2023-09-15 DIAGNOSIS — G8929 Other chronic pain: Secondary | ICD-10-CM

## 2023-09-15 NOTE — Therapy (Signed)
 OUTPATIENT PHYSICAL THERAPY TREATMENT NOTE   Patient Name: Leslie Arroyo MRN: [id number] DOB:08/24/1990, 33 y.o., female Today's Date: 09/15/2023  END OF SESSION:  PT End of Session - 09/15/23 0902     Visit Number 5    Number of Visits 12    Date for PT Re-Evaluation 09/24/23    Authorization Type UHC    Authorization Time Period VL 20    Authorization - Visit Number 4    Authorization - Number of Visits 20    PT Start Time 0902    PT Stop Time 0942    PT Time Calculation (min) 40 min    Activity Tolerance Patient tolerated treatment well    Behavior During Therapy [facility] for tasks assessed/performed              Past Medical History:  Diagnosis Date   Kidney stones    Prediabetes    Past Surgical History:  Procedure Laterality Date   EXTRACORPOREAL SHOCK WAVE LITHOTRIPSY Left 09/16/2018   Procedure: EXTRACORPOREAL SHOCK WAVE LITHOTRIPSY (ESWL);  Surgeon: Rene Paci, MD;  Location: [facility];  Service: Urology;  Laterality: Left;   HARDWARE REMOVAL Right    leg   KNEE ARTHROSCOPY Left    LEG SURGERY Right    WISDOM TOOTH EXTRACTION     Patient Active Problem List   Diagnosis Date Noted   Positive ANA (antinuclear antibody) 02/08/2023   Rash and other nonspecific skin eruption 01/03/2023   Urticaria 01/03/2023   Other allergic rhinitis 01/03/2023   Elevated blood pressure reading 01/03/2023   Sore throat 11/20/2022   Gastroesophageal reflux disease 07/10/2022   Primary hypertension 05/03/2022   Morbid obesity (HCC) 05/03/2022   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   Prediabetes 05/30/2021   Family history of kidney stones 10/24/2017   History of kidney stones 08/08/2017    PCP: Gerre Scull, NP   REFERRING PROVIDER: Salvatore Decent, FNP  REFERRING DIAG: [phone] (ICD-10-CM) - Cervical radiculopathy  THERAPY DIAG:  Radiculopathy, cervical region  Chronic right shoulder pain  Rationale for Evaluation and Treatment:  Rehabilitation  ONSET DATE: 06/2023  SUBJECTIVE:                                                                                                                                                                                                         SUBJECTIVE STATEMENT: Patient reports that her pain has improved and that she notices most of her pain at the end of the work day. Her new job is going well and got a new  chair this week which is more ergonomic.   EVAL: Describes a history or R sided arm pain brought on by prolonged positioning and computer work.  Symptoms described as diffuse N&T as well as paresthesias not following any distinct dermatome.  Symptoms resolved with position changes. Hand dominance: Right  PERTINENT HISTORY:  HPI: Discussed the use of AI scribe software for clinical note transcription with the patient, who gave verbal consent to proceed.   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 They describe the pain as a dull, tugging sensation that radiates from the back of their neck down to their right arm, occasionally causing numbness. The pain is exacerbated by prolonged computer use and is only partially relieved by dual-action Aleve. The patient has a history of a dislocated right shoulder blade in high school and scoliosis, and has been working extensively on the computer for the past few years. They also report occasional right-sided weakness, particularly noticeable when holding objects like a coffee cup. The patient has tried ibuprofen for pain relief, but it has been ineffective.  PAIN:  Are you having pain? Yes: NPRS scale: 10/10 Pain location: R shoulder/UT Pain description: ache Aggravating factors: computer work Relieving factors: rest, medication  PRECAUTIONS: None  RED FLAGS: None     WEIGHT BEARING RESTRICTIONS: No  FALLS:  Has patient fallen in last  6 months? No  OCCUPATION: Risk analyst  PLOF: Independent  PATIENT GOALS: To mange my symptoms  NEXT MD VISIT: TBD  OBJECTIVE:  Note: Objective measures were completed at Evaluation unless otherwise noted.  DIAGNOSTIC FINDINGS:  none  PATIENT SURVEYS:  FOTO 61(75 predicted)  POSTURE: rounded shoulders and forward head  PALPATION: TTP R UT and Scalenes   CERVICAL ROM:   Active ROM A/PROM (deg) eval  Flexion 90%  Extension 90%  Right lateral flexion 90%  Left lateral flexion 75%  Right rotation 90%  Left rotation 90%   (Blank rows = not tested)  UPPER EXTREMITY ROM: WNL throughout  Active ROM Right eval Left eval  Shoulder flexion    Shoulder extension    Shoulder abduction    Shoulder adduction    Shoulder extension    Shoulder internal rotation    Shoulder external rotation    Elbow flexion    Elbow extension    Wrist flexion    Wrist extension    Wrist ulnar deviation    Wrist radial deviation    Wrist pronation    Wrist supination     (Blank rows = not tested)  UPPER EXTREMITY MMT:  MMT Right eval Left eval  Shoulder flexion    Shoulder extension    Shoulder abduction    Shoulder adduction    Shoulder extension    Shoulder internal rotation    Shoulder external rotation    Middle trapezius    Lower trapezius    Elbow flexion    Elbow extension    Wrist flexion    Wrist extension    Wrist ulnar deviation    Wrist radial deviation    Wrist pronation    Wrist supination    Grip strength/pinch 62/11 58/11       (Blank rows = not tested)  CERVICAL SPECIAL TESTS:  Neck flexor muscle endurance test: Negative, Upper limb tension test (ULTT): Positive, and Spurling's test: Negative  FUNCTIONAL TESTS:  30 seconds chair stand test 14  TREATMENT DATE: [facility]  Adult PT Treatment:                                                 09/15/2023 Therapeutic Exercise  UBE unilateral CW/CCW 1'x1' (4 minutes total) 2x10 single arm serratus  punch up (6lbs) 3x10 cable column pull downs (bilateral 7lbs) 2x15 cervical SNAGs for extension 2 minutes self STM with tennis ball to rhomboids  Manual Therapy STM right upper trap, scalenes, suboccipitals Median nerve glides  Neuro Re-ed 2x10 standing shoulder sweeps with chin tuck for postural control (1lbs) 3x30 seconds bus drivers for neuromotor endurance 10 reps wall clocks with 1kg ball for neuromotor control and postural control  DATE: 09/08/23 Therapeutic Exercise: UBE Level 1 3'/3' fwd/bwd 3x10 cable column pull downs 2 cables 7lbs ea Neuromuscular re-ed: 10 reps shoulder wall clocks 500 g ball for neuromotor endurance Seated horizontal abd with chin tuck RTB 2x10 Median nerve glide tray x20 Rt 3x10 cable column rows 2 cable x10lbs ea for postural stability Back against wall 500g ball hand off abduction x10 Supine on foam roller x10 ea: protraction/retraction, alternating flexion/extension, circles CW/CCW, horizontal abduction RTB Supine pec stretch on foam roller x30"   [facility] Adult PT Treatment:                                                 09/01/2023 Therapeutic Exercise: UBE Level 1 2'/2' fwd/bwd 15 reps cervical extension SNAGs with towel 3x10 cable column pull downs 7lbs Self STM tennis ball to rhomboids and upper trap x3 minutes  Manual Therapy STM to right upper trap, suboccipital, and forearm Median nerve glides  Neuro Re-ed 3x10 horizontal abduction with chin tuck for postural re-ed (RTB) 10 reps shoulder wall clocks 500 g ball for neuromotor endurance 3x10 cable column rows 13lbs for postural stability  DATE: 08/25/23 Therapeutic Exercise: UBE level 1 3'/3' fwd/bwd while gathering subjective and planning session with patient Lat pull down 35# 2x10 High/low rows 35# 2x10 Shoulder extension BlueTB 2x10 Seated BIL ER with scap retraction RTB x10 Median nerve glide tray x15 Seated chin tucks x15 Self Care: Theracane self instruction Office posture  and desk ergonomics     PATIENT EDUCATION:  Education details: Discussed eval findings, rehab rationale and POC and patient is in agreement  Person educated: Patient Education method: Explanation Education comprehension: verbalized understanding and needs further education  HOME EXERCISE PROGRAM: Access Code: [email or web address] URL: [email or web address] Date: 07/27/2023 Prepared by: Gustavus Bryant  Exercises - Standing Shoulder Horizontal Abduction with Resistance  - 1 x daily - 5 x weekly - 2 sets - 10 reps - Seated Upper Trap Stretch  - 1 x daily - 5 x weekly - 1 sets - 2 reps - 30s hold - Seated Cervical Sidebending Stretch  - 1 x daily - 5 x weekly - 1 sets - 2 reps - 30s hold - Standing Shoulder External Rotation with Resistance  - 1 x daily - 5 x weekly - 2 sets - 10 reps  Patient Education - Office Posture  ASSESSMENT:  CLINICAL IMPRESSION: Patient presents to PT reporting continued improvements in her pain. Presents with continued soft tissue restrictions of right upper trap and scalenes as well as continued median nerve tension. Patient responds  well to manual therapy with reports of decreased pain with movement. Patient progressed with several activities emphasizing neuromotor endurance and postural control. Patient tolerates without reproduction of symptoms but does require min tactile and verbal cueing to decrease upper trap hiking. Patient continues to benefit from skilled PT services and should be progressed as able to improve functional independence.   EVAL: Patient is a 33 y.o. female who was seen today for physical therapy evaluation and treatment for myofacial R UE symptoms brought due to underlying soft tissue restrictions and TrPs in R UT and scalene group. R shoulder ROM WNL and no strength deficits identified.  Cervical ROM only slightly restricted in L SB with ULTT recreating patient symptoms.   OBJECTIVE IMPAIRMENTS: decreased knowledge of condition,  decreased ROM, increased fascial restrictions, impaired perceived functional ability, impaired UE functional use, postural dysfunction, and pain.   ACTIVITY LIMITATIONS: carrying, lifting, sitting, and computer work  PERSONAL FACTORS: Age, Past/current experiences, and 1 comorbidity: positive ANA  are also affecting patient's functional outcome.   REHAB POTENTIAL: Good  CLINICAL DECISION MAKING: Stable/uncomplicated  EVALUATION COMPLEXITY: Low   GOALS: Goals reviewed with patient? No  SHORT TERM GOALS: Target date: 08/20/2023  Patient to demonstrate independence in HEP  Baseline: [id number] Goal status: INITIAL    LONG TERM GOALS: Target date: 09/10/2023  Patient will acknowledge 4/10 pain at least once during episode of care   Baseline: 10/10 Goal status: INITIAL  2.  Patient will score at least 75% on FOTO to signify clinically meaningful improvement in functional abilities.   Baseline: 61% Goal status: INITIAL  3.  Increase L SB to 90% Baseline:  Active ROM A/PROM (deg) eval  Flexion 90%  Extension 90%  Right lateral flexion 90%  Left lateral flexion 75%  Right rotation 90%  Left rotation 90%    Goal status: INITIAL  4.  Minimal irritation with RUE ULTT Baseline: Moderate irritation Goal status: INITIAL     PLAN:  PT FREQUENCY: 1-2x/week  PT DURATION: 6 weeks  PLANNED INTERVENTIONS: 97164- PT Re-evaluation, 97110-Therapeutic exercises, 97530- Therapeutic activity, 97112- Neuromuscular re-education, 97535- Self Care, 14782- Manual therapy, Dry Needling, Joint mobilization, and Spinal mobilization  PLAN FOR NEXT SESSION: HEP review and update, manual techniques as appropriate, aerobic tasks, ROM and flexibility activities, strengthening and PREs, TPDN, gait and balance training as needed     Erskine Emery Mykelti Goldenstein, PT 09/15/2023, 9:46 AM

## 2023-09-19 NOTE — Therapy (Unsigned)
 OUTPATIENT PHYSICAL THERAPY TREATMENT NOTE   Patient Name: Leslie Arroyo MRN: [id number] DOB:1991/01/08, 33 y.o., female Today's Date: 09/21/2023  END OF SESSION:  PT End of Session - 09/21/23 0833     Visit Number 6    Number of Visits 12    Date for PT Re-Evaluation 09/24/23    Authorization Type [employer]    Authorization Time Period VL 20    Authorization - Number of Visits 20    PT Start Time 0830    Activity Tolerance Patient tolerated treatment well    Behavior During Therapy [facility] for tasks assessed/performed               Past Medical History:  Diagnosis Date   Kidney stones    Prediabetes    Past Surgical History:  Procedure Laterality Date   EXTRACORPOREAL SHOCK WAVE LITHOTRIPSY Left 09/16/2018   Procedure: EXTRACORPOREAL SHOCK WAVE LITHOTRIPSY (ESWL);  Surgeon: Rene Paci, MD;  Location: [facility];  Service: Urology;  Laterality: Left;   HARDWARE REMOVAL Right    leg   KNEE ARTHROSCOPY Left    LEG SURGERY Right    WISDOM TOOTH EXTRACTION     Patient Active Problem List   Diagnosis Date Noted   Positive ANA (antinuclear antibody) 02/08/2023   Rash and other nonspecific skin eruption 01/03/2023   Urticaria 01/03/2023   Other allergic rhinitis 01/03/2023   Elevated blood pressure reading 01/03/2023   Sore throat 11/20/2022   Gastroesophageal reflux disease 07/10/2022   Primary hypertension 05/03/2022   Morbid obesity (HCC) 05/03/2022   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   Prediabetes 05/30/2021   Family history of kidney stones 10/24/2017   History of kidney stones 08/08/2017    PCP: Gerre Scull, NP   REFERRING PROVIDER: Salvatore Decent, FNP  REFERRING DIAG: [phone] (ICD-10-CM) - Cervical radiculopathy  THERAPY DIAG:  Radiculopathy, cervical region - Plan: PT plan of care cert/re-cert  Chronic right shoulder pain - Plan: PT plan of care cert/re-cert  Muscle weakness (generalized) - Plan: PT plan of care  cert/re-cert  Rationale for Evaluation and Treatment: Rehabilitation  ONSET DATE: 06/2023  SUBJECTIVE:                                                                                                                                                                                                         SUBJECTIVE STATEMENT: Reports temporary relief of symptoms with stertching tasks but continues to rely on muscle relaxers for comfort.  RUE paresthesias persist along radial nerve distribution.  EVAL: Describes a history  or R sided arm pain brought on by prolonged positioning and computer work.  Symptoms described as diffuse N&T as well as paresthesias not following any distinct dermatome.  Symptoms resolved with position changes. Hand dominance: Right  PERTINENT HISTORY:  HPI: Discussed the use of AI scribe software for clinical note transcription with the patient, who gave verbal consent to proceed.   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 They describe the pain as a dull, tugging sensation that radiates from the back of their neck down to their right arm, occasionally causing numbness. The pain is exacerbated by prolonged computer use and is only partially relieved by dual-action Aleve. The patient has a history of a dislocated right shoulder blade in high school and scoliosis, and has been working extensively on the computer for the past few years. They also report occasional right-sided weakness, particularly noticeable when holding objects like a coffee cup. The patient has tried ibuprofen for pain relief, but it has been ineffective.  PAIN:  Are you having pain? Yes: NPRS scale: 10/10 Pain location: R shoulder/UT Pain description: ache Aggravating factors: computer work Relieving factors: rest, medication  PRECAUTIONS: None  RED FLAGS: None     WEIGHT BEARING RESTRICTIONS:  No  FALLS:  Has patient fallen in last 6 months? No  OCCUPATION: Risk analyst  PLOF: Independent  PATIENT GOALS: To mange my symptoms  NEXT MD VISIT: TBD  OBJECTIVE:  Note: Objective measures were completed at Evaluation unless otherwise noted.  DIAGNOSTIC FINDINGS:  none  PATIENT SURVEYS:  FOTO 61(75 predicted) 09/21/23 68  POSTURE: rounded shoulders and forward head  PALPATION: TTP R UT and Scalenes  09/21/23 TTP R UT, R scalene group and infraspinatus  CERVICAL ROM:   Active ROM A/PROM (deg) eval 09/21/23  Flexion 90% 100%  Extension 90% 100%  Right lateral flexion 90% 100%  Left lateral flexion 75% 100%  Right rotation 90% 100%  Left rotation 90% 100%   (Blank rows = not tested)  UPPER EXTREMITY ROM: WNL throughout  Active ROM Right eval Left eval  Shoulder flexion    Shoulder extension    Shoulder abduction    Shoulder adduction    Shoulder extension    Shoulder internal rotation    Shoulder external rotation    Elbow flexion    Elbow extension    Wrist flexion    Wrist extension    Wrist ulnar deviation    Wrist radial deviation    Wrist pronation    Wrist supination     (Blank rows = not tested)  UPPER EXTREMITY MMT:  MMT Right eval Left eval  Shoulder flexion    Shoulder extension    Shoulder abduction    Shoulder adduction    Shoulder extension    Shoulder internal rotation    Shoulder external rotation    Middle trapezius    Lower trapezius    Elbow flexion    Elbow extension    Wrist flexion    Wrist extension    Wrist ulnar deviation    Wrist radial deviation    Wrist pronation    Wrist supination    Grip strength/pinch 62/11 58/11       (Blank rows = not tested)  CERVICAL SPECIAL TESTS:  Neck flexor muscle endurance test: Negative, Upper limb tension test (ULTT): Positive, and Spurling's test: Negative  FUNCTIONAL TESTS:  30 seconds chair stand test  14  TREATMENT DATE: [facility] Adult PT Treatment:                                                 DATE: 09/21/23 Therapeutic Exercise: HEP review and update incorporating postural retraining  Supine hor abd RTB 15x Doorway stretch 30s x3 Manual Therapy: R first rib mob 5x10 R first rib MET into FF 5x5  Therapeutic Activity: FOTO retake/re-assess R scalene stretch seated 30s x3 R scalene stretch supine 30s x3  [facility] Adult PT Treatment:                                                 09/15/2023 Therapeutic Exercise  UBE unilateral CW/CCW 1'x1' (4 minutes total) 2x10 single arm serratus punch up (6lbs) 3x10 cable column pull downs (bilateral 7lbs) 2x15 cervical SNAGs for extension 2 minutes self STM with tennis ball to rhomboids  Manual Therapy STM right upper trap, scalenes, suboccipitals Median nerve glides  Neuro Re-ed 2x10 standing shoulder sweeps with chin tuck for postural control (1lbs) 3x30 seconds bus drivers for neuromotor endurance 10 reps wall clocks with 1kg ball for neuromotor control and postural control  DATE: 09/08/23 Therapeutic Exercise: UBE Level 1 3'/3' fwd/bwd 3x10 cable column pull downs 2 cables 7lbs ea Neuromuscular re-ed: 10 reps shoulder wall clocks 500 g ball for neuromotor endurance Seated horizontal abd with chin tuck RTB 2x10 Median nerve glide tray x20 Rt 3x10 cable column rows 2 cable x10lbs ea for postural stability Back against wall 500g ball hand off abduction x10 Supine on foam roller x10 ea: protraction/retraction, alternating flexion/extension, circles CW/CCW, horizontal abduction RTB Supine pec stretch on foam roller x30"   [facility] Adult PT Treatment:                                                 09/01/2023 Therapeutic Exercise: UBE Level 1 2'/2' fwd/bwd 15 reps cervical extension SNAGs with towel 3x10 cable column pull downs 7lbs Self STM tennis ball to rhomboids and upper trap x3 minutes  Manual Therapy STM to right upper trap, suboccipital, and forearm Median nerve glides  Neuro Re-ed 3x10  horizontal abduction with chin tuck for postural re-ed (RTB) 10 reps shoulder wall clocks 500 g ball for neuromotor endurance 3x10 cable column rows 13lbs for postural stability  DATE: 08/25/23 Therapeutic Exercise: UBE level 1 3'/3' fwd/bwd while gathering subjective and planning session with patient Lat pull down 35# 2x10 High/low rows 35# 2x10 Shoulder extension BlueTB 2x10 Seated BIL ER with scap retraction RTB x10 Median nerve glide tray x15 Seated chin tucks x15 Self Care: Theracane self instruction Office posture and desk ergonomics     PATIENT EDUCATION:  Education details: Discussed eval findings, rehab rationale and POC and patient is in agreement  Person educated: Patient Education method: Explanation Education comprehension: verbalized understanding and needs further education  HOME EXERCISE PROGRAM: Access Code: [email or web address] URL: [email or web address] Date: 09/21/2023 Prepared by: Gustavus Bryant  Exercises - Seated Cervical Sidebending Stretch  - 1 x daily - 5 x weekly - 1 sets - 2 reps - 30s hold -  Standing Shoulder External Rotation with Resistance  - 1 x daily - 5 x weekly - 2 sets - 10 reps - Supine Shoulder Horizontal Abduction with Resistance  - 1 x daily - 5 x weekly - 2 sets - 15 reps - Doorway Pec Stretch at 90 Degrees Abduction  - 1 x daily - 5 x weekly - 1 sets - 3 reps - 30s hold  Patient Education - Scientist, physiological  Patient Education - Office Posture  ASSESSMENT:  CLINICAL IMPRESSION: R-assessment of progress made towards goals as well as determination of continued pain drivers.  Addressed R scalene tightness as well as elevated first rib/anterior scalene syndrome.  Discussed option of TPDN to address TrPs in R UT and infraspinatus  EVAL: Patient is a 33 y.o. female who was seen today for physical therapy evaluation and treatment for myofacial R UE symptoms brought due to underlying soft tissue restrictions and TrPs in R UT and scalene  group. R shoulder ROM WNL and no strength deficits identified.  Cervical ROM only slightly restricted in L SB with ULTT recreating patient symptoms.   OBJECTIVE IMPAIRMENTS: decreased knowledge of condition, decreased ROM, increased fascial restrictions, impaired perceived functional ability, impaired UE functional use, postural dysfunction, and pain.   ACTIVITY LIMITATIONS: carrying, lifting, sitting, and computer work  PERSONAL FACTORS: Age, Past/current experiences, and 1 comorbidity: positive ANA  are also affecting patient's functional outcome.   REHAB POTENTIAL: Good  CLINICAL DECISION MAKING: Stable/uncomplicated  EVALUATION COMPLEXITY: Low   GOALS: Goals reviewed with patient? No  SHORT TERM GOALS: Target date: 08/20/2023  Patient to demonstrate independence in HEP  Baseline: [id number] Goal status: Met    LONG TERM GOALS: Target date: 09/10/2023  Patient will acknowledge 4/10 pain at least once during episode of care   Baseline: 10/10; 09/21/23 6/10 Goal status: Ongoing  2.  Patient will score at least 75% on FOTO to signify clinically meaningful improvement in functional abilities.   Baseline: 61%; 09/21/23 68% Goal status: Ongoing  3.  Increase L SB to 90% Baseline:  Active ROM A/PROM (deg) eval 09/21/23  Flexion 90% 100%  Extension 90% 100%  Right lateral flexion 90% 100%  Left lateral flexion 75% 100%  Right rotation 90% 100%  Left rotation 90% 100%    Goal status: Met  4.  Minimal irritation with RUE ULTT Baseline: Moderate irritation Goal status: INITIAL     PLAN:  PT FREQUENCY: 1x/week  PT DURATION: 4 weeks  PLANNED INTERVENTIONS: 97164- PT Re-evaluation, 97110-Therapeutic exercises, 97530- Therapeutic activity, 97112- Neuromuscular re-education, 97535- Self Care, 48270- Manual therapy, Dry Needling, Joint mobilization, and Spinal mobilization  PLAN FOR NEXT SESSION: HEP review and update, manual techniques as appropriate, aerobic tasks, ROM  and flexibility activities, strengthening and PREs, TPDN, gait and balance training as needed     Hildred Laser, PT 09/21/2023, 10:43 AM

## 2023-09-21 ENCOUNTER — Ambulatory Visit: Payer: 59

## 2023-09-21 DIAGNOSIS — M5412 Radiculopathy, cervical region: Secondary | ICD-10-CM

## 2023-09-21 DIAGNOSIS — M6281 Muscle weakness (generalized): Secondary | ICD-10-CM

## 2023-09-21 DIAGNOSIS — G8929 Other chronic pain: Secondary | ICD-10-CM

## 2023-09-21 NOTE — Patient Instructions (Signed)

## 2023-09-25 ENCOUNTER — Encounter: Payer: Self-pay | Admitting: Neurology

## 2023-09-25 ENCOUNTER — Ambulatory Visit: Admitting: Neurology

## 2023-09-25 VITALS — BP 138/89 | HR 92 | Ht 66.0 in | Wt 257.0 lb

## 2023-09-25 DIAGNOSIS — G4710 Hypersomnia, unspecified: Secondary | ICD-10-CM | POA: Insufficient documentation

## 2023-09-25 DIAGNOSIS — R0683 Snoring: Secondary | ICD-10-CM | POA: Insufficient documentation

## 2023-09-25 DIAGNOSIS — G4730 Sleep apnea, unspecified: Secondary | ICD-10-CM

## 2023-09-25 DIAGNOSIS — R6889 Other general symptoms and signs: Secondary | ICD-10-CM | POA: Diagnosis not present

## 2023-09-25 DIAGNOSIS — R442 Other hallucinations: Secondary | ICD-10-CM

## 2023-09-25 DIAGNOSIS — G4719 Other hypersomnia: Secondary | ICD-10-CM | POA: Insufficient documentation

## 2023-09-25 NOTE — Patient Instructions (Signed)
 Hypersomnia Hypersomnia is a condition in which a person feels very tired during the day even though the person gets plenty of sleep at night. A person with this condition may take naps during the day and may find it very difficult to wake up from sleep. Hypersomnia may affect a person's ability to think, concentrate, drive, or remember things. What are the causes? The cause of this condition may not be known. Possible causes include: Taking certain medicines. Using drugs or alcohol. Sleep disorders, such as narcolepsy and sleep apnea. Injury to the head, brain, or spinal cord. Tumors. Certain medical conditions. These include: Depression. Diabetes. Gastroesophageal reflux disease (GERD). An underactive thyroid gland (hypothyroidism). What are the signs or symptoms? The main symptoms of hypersomnia include: Feeling very tired throughout the day, regardless of how much sleep you got the night before. Having trouble waking up. Others may find it difficult to wake you up when you are sleeping. Sleeping for longer and longer periods at a time. Taking naps throughout the day. Other symptoms may include: Feeling restless, anxious, or annoyed. Lacking energy. Having trouble with: Remembering. Speaking. Thinking. Loss of appetite. Seeing, hearing, tasting, smelling, or feeling things that are not real (hallucinations). How is this diagnosed? This condition may be diagnosed based on: Your symptoms and medical history. Your sleeping habits. Your health care provider may ask you to write down your sleeping habits in a daily sleep log, along with any symptoms you have. A series of tests that are done while you sleep (sleep study or polysomnogram). A test that measures how quickly you can fall asleep during the day (daytime nap study or multiple sleep latency test). How is this treated? This condition may be treated by: Following a regular sleep routine. Making lifestyle changes, such as  changing your eating habits, getting regular exercise, and avoiding alcohol or caffeinated beverages. Taking medicines to make you more alert (stimulants) during the day. Treating any underlying medical causes of hypersomnia. Follow these instructions at home: Sleep habits Stick to a routine that includes going to bed and waking up at the same times every day and night. Practice a relaxing bedtime routine. This may include reading, meditation, deep breathing, or taking a warm bath before going to sleep. Exercise regularly as told by your health care provider. However, avoid exercising in the hours right before bedtime. Keep your sleep environment at a cooler temperature, darkened, and quiet. Sleep with pillows and a mattress that are comfortable and supportive. Schedule short 20-minute naps for when you feel sleepiest during the day. Talk with your employer or teachers about your hypersomnia. If possible, adjust your schedule so that: You have a regular daytime work schedule. You can take a scheduled nap during the day. You do not have to work or be active at night. Do not eat a heavy meal for a few hours before bedtime. Eat your meals at about the same times every day. Safety  Do not drive or use machinery if you are sleepy. Ask your health care provider if it is safe for you to drive. Wear a life jacket when swimming or spending time near water. General instructions  Take over-the-counter and prescription medicines only as told by your health care provider. This includes supplements. Avoid drinking alcohol or caffeinated beverages. Keep a sleep log that will help your health care provider manage your condition. This may include information about: What time you go to bed each night. How often you wake up at night. How many hours  you sleep at night. How often and for how long you nap during the day. Any observations from others, such as leg movements during sleep, sleep walking, or  snoring. Keep all follow-up visits. This is important. Contact a health care provider if: You have new symptoms. Your symptoms get worse. Get help right away if: You have thoughts about hurting yourself or someone else. Get help right away if you feel like you may hurt yourself or others, or have thoughts about taking your own life. Go to your nearest emergency room or: Call 911. Call the National Suicide Prevention Lifeline at (901)715-1730 or 988. This is open 24 hours a day. Text the Crisis Text Line at 269-534-7659. Summary Hypersomnia refers to a condition in which you feel very tired during the day even though you get plenty of sleep at night. A person with this condition may take naps during the day and may find it very difficult to wake up from sleep. Hypersomnia may affect a person's ability to think, concentrate, drive, or remember things. Treatment may include a regular sleep routine and making some lifestyle changes. This information is not intended to replace advice given to you by your health care provider. Make sure you discuss any questions you have with your health care provider. Document Revised: 05/30/2021 Document Reviewed: 05/30/2021 Elsevier Patient Education  2024 [employer]  ASSESSMENT AND PLAN:  33 y.o. year old female here with:   HYPERSOMNIA>   excessive sleepiness.      1) Excessive daytime sleepiness all her life !! Epworth SS at 16/ 24 points.    2) Vivid dreams, no sleep paralysis, hypnopompic hallucinations, no cataplexy.    3) not refreshed by power naps.    4) has been snoring, had gained weight with and after depression,  lost 2 pregnancies  at 9 weeks. SSA positive.  One time ANA positive. Many risk factors for OSA are BMI, neck size and allergic rhinitis, mouth breathing, dental status.      HST first- screening for OSA and initiating treatment .  Watch pat please, needs HST before we may need to do MSLT.  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    I plan to  follow up either personally or through our NP within 3-5 months.

## 2023-09-25 NOTE — Progress Notes (Signed)
 SLEEP MEDICINE CLINIC    Provider:  Melvyn Novas, MD  Primary Care Physician:  Gerre Scull, NP [address] [place] [address]     Referring Provider: Gerre Scull, Np [address] [place],  [place] [address]          Chief Complaint according to patient   Patient presents with:     New Patient (Initial Visit)           HISTORY OF PRESENT ILLNESS:  Leslie Arroyo is a 33 y.o. female patient who is seen upon referral on 09/25/2023 from PCP NP [facility] for a sleep evaluation.   Chief concern according to patient :   I am the sleepiest person in the family, I fell asleep on the potty and while playing hide and seek. "" I am in PT for  the right shoulder , cervical radiculopathy,  HTN and  prediabetes on Metformin. Positive ANA followed by Dr Pollyann Savoy "      Sleep relevant medical history: Obesity, Nocturia 0-1, Sleep attacks,No ENT surgery, hx of shoulder dislocation ,rhinitis,     Family medical /sleep history: Mother with OSA, .    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  The patient currently works  9-5, used to work free lancing, some late or at night.  Tobacco use: none .  ETOH use ; seldomly,  Caffeine intake in form of Coffee( 3 day ) Soda( 2 week ) Tea ( black tea) . Exercise in form of 2-3 / week  cardio. .   Hobbies : hiking , trail on weekends.       Sleep habits are as follows: The patient's dinner time is between 5-6 PM. The patient goes to bed at 9.30 PM and continues to sleep for 6-8 hours, wakes sometimes for 0-1 bathroom breaks.   The preferred sleep position is right side , with the support of 1-2 pillows.  Dreams are reportedly  frequent/vivid- every night .   The patient wakes up before her  alarm. 6.30  AM is the usual rise time.  She reports mostly  feeling refreshed or restored in AM,  awake- with symptoms such as dry mouth, morning headaches, and residual fatigue. Naps are taken frequently, she can nap any time any  place- but she can't do power naps.  lasting from 1-3 hours , not interfering  nocturnal sleep.    Review of Systems: Out of a complete 14 system review, the patient complains of only the following symptoms, and all other reviewed systems are negative.:  Fatigue, sleepiness, snoring   Obesity -  BMI 42.6    How likely are you to doze in the following situations: 0 = not likely, 1 = slight chance, 2 = moderate chance, 3 = high chance   Sitting and Reading? Watching Television? Sitting inactive in a public place (theater or meeting)? As a passenger in a car for an hour without a break? Lying down in the afternoon when circumstances permit? Sitting and talking to someone? Sitting quietly after lunch without alcohol? In a car, while stopped for a few minutes in traffic?   Total = 18/ 24 points  (!!)   FSS endorsed at 33/ 63 points.   Social History   Socioeconomic History   Marital status: Married  Spouse name: Not on file   Number of children: Not on file   Years of education: Not on file   Highest education level: Not on file  Occupational History   Not on file  Tobacco Use   Smoking status: Never    Passive exposure: Never   Smokeless tobacco: Never  Vaping Use   Vaping status: Never Used  Substance and Sexual Activity   Alcohol use: Yes    Comment: socially   Drug use: Never   Sexual activity: Yes    Birth control/protection: None  Other Topics Concern   Not on file  Social History Narrative   Not on file   Social Drivers of Health   Financial Resource Strain: Not on file  Food Insecurity: No Food Insecurity (01/27/2020)   Hunger Vital Sign    Worried About Running Out of Food in the Last Year: Never true    Ran Out of Food in the Last Year: Never true  Transportation Needs: No  Transportation Needs (01/27/2020)   PRAPARE - Administrator, Civil Service (Medical): No    Lack of Transportation (Non-Medical): No  Physical Activity: Not on file  Stress: Not on file  Social Connections: Not on file    Family History  Problem Relation Age of Onset   Hypertension Mother    Diabetes Mother    Rheum arthritis Mother    Lupus Mother    Cancer Father 39       kidney cancer   Congenital heart disease Brother    Breast cancer Paternal Uncle 18   Rheum arthritis Maternal Grandmother    Hypertension Maternal Grandmother    Diabetes Maternal Grandmother    Cancer Maternal Grandmother        stomach   Hypertension Maternal Grandfather    Diabetes Maternal Grandfather     Past Medical History:  Diagnosis Date   Kidney stones    Prediabetes     Past Surgical History:  Procedure Laterality Date   EXTRACORPOREAL SHOCK WAVE LITHOTRIPSY Left 09/16/2018   Procedure: EXTRACORPOREAL SHOCK WAVE LITHOTRIPSY (ESWL);  Surgeon: Rene Paci, MD;  Location: [facility];  Service: Urology;  Laterality: Left;   HARDWARE REMOVAL Right    leg   KNEE ARTHROSCOPY Left    LEG SURGERY Right    WISDOM TOOTH EXTRACTION       Current Outpatient Medications on File Prior to Visit  Medication Sig Dispense Refill   labetalol (NORMODYNE) 200 MG tablet Take 1 tablet (200 mg total) by mouth 2 (two) times daily. 180 tablet 1   metFORMIN (GLUCOPHAGE-XR) 500 MG 24 hr tablet Take 1 tablet (500 mg total) by mouth daily with breakfast. 90 tablet 1   methocarbamol (ROBAXIN) 500 MG tablet Take 1 tablet (500 mg total) by mouth every 8 (eight) hours as needed for muscle spasms. 30 tablet 0   NIFEdipine (PROCARDIA-XL/NIFEDICAL-XL) 30 MG 24 hr tablet Take 1 tablet by mouth once daily 90 tablet 0   ondansetron (ZOFRAN-ODT) 4 MG disintegrating tablet Take 1 tablet (4 mg total) by mouth every 8 (eight) hours as needed for nausea or vomiting. 30 tablet 1   Prenatal Vit-Fe Fumarate-FA  (PRENATAL MULTIVITAMIN) TABS tablet Take 1 tablet by mouth daily at 12 noon.     tirzepatide (ZEPBOUND) 2.5 MG/0.5ML Pen Inject 2.5 mg into the skin once a week. 2 mL 1   No current facility-administered medications on file prior to visit.    Allergies  Allergen Reactions  Dust Mite Extract      DIAGNOSTIC DATA (LABS, IMAGING, TESTING) - I reviewed patient records, labs, notes, testing and imaging myself where available.  Lab Results  Component Value Date   WBC 10.3 01/03/2023   HGB 12.4 01/03/2023   HCT 40.6 01/03/2023   MCV 76 (L) 01/03/2023   PLT 310 01/03/2023      Component Value Date/Time   NA 139 01/03/2023 1446   K 4.4 01/03/2023 1446   CL 103 01/03/2023 1446   CO2 22 01/03/2023 1446   GLUCOSE 103 (H) 01/03/2023 1446   GLUCOSE 93 04/17/2022 0842   BUN 11 01/03/2023 1446   CREATININE 0.94 01/03/2023 1446   CREATININE 0.76 04/17/2022 0842   CALCIUM 9.4 01/03/2023 1446   PROT 7.6 01/03/2023 1446   ALBUMIN 4.3 01/03/2023 1446   AST 15 01/03/2023 1446   ALT 11 01/03/2023 1446   ALKPHOS 90 01/03/2023 1446   BILITOT <0.2 01/03/2023 1446   GFRNONAA >60 01/16/2020 1114   GFRAA >60 01/16/2020 1114   Lab Results  Component Value Date   CHOL 190 03/28/2022   HDL 57 03/28/2022   LDLCALC 111 (H) 03/28/2022   TRIG 108 03/28/2022   CHOLHDL 3.3 03/28/2022   Lab Results  Component Value Date   HGBA1C 5.9 (A) 09/04/2023   HGBA1C 5.9 09/04/2023   HGBA1C 5.9 09/04/2023   HGBA1C 5.9 09/04/2023   No results found for: "VITAMINB12" Lab Results  Component Value Date   TSH 2.160 01/03/2023    PHYSICAL EXAM:  Today's Vitals   09/25/23 0856  BP: 138/89  Pulse: 92  Weight: 257 lb (116.6 kg)  Height: 5\' 6"  (1.676 m)   Body mass index is 41.48 kg/m.   Wt Readings from Last 3 Encounters:  09/25/23 257 lb (116.6 kg)  09/04/23 260 lb 9.6 oz (118.2 kg)  07/26/23 264 lb 12.8 oz (120.1 kg)     Ht Readings from Last 3 Encounters:  09/25/23 5\' 6"  (1.676 m)   09/04/23 5\' 6"  (1.676 m)  07/26/23 5\' 6"  (1.676 m)      General: The patient is awake, alert and appears not in acute distress. The patient is well groomed. Head: Normocephalic, atraumatic. Neck is supple.  Mallampati 2,  large tonsils.  neck circumference:16.5  inches . Nasal airflow not fully  patent.   Retrognathia is  seen.  Dental status: irregular  teeth, overbite,  Cardiovascular:  Regular rate and cardiac rhythm by pulse,  without distended neck veins. Respiratory: Lungs are clear to auscultation.  Skin:  Without evidence of ankle edema, or rash. Trunk: The patient's posture is erect.   NEUROLOGIC EXAM: The patient is awake and alert, oriented to place and time.   Memory subjective described as intact.  Attention span & concentration ability appears normal.  Speech is fluent,  without  dysarthria, dysphonia or aphasia.  Mood and affect are appropriate.   Cranial nerves: no loss of smell or taste reported  Pupils are equal and briskly reactive to light. Funduscopic exam deferred,.  Extraocular movements in vertical and horizontal planes were intact and without nystagmus. No Diplopia. Visual fields by finger perimetry are intact. Hearing was intact to soft voice and finger rubbing.    Facial sensation intact to fine touch.  Facial motor strength is symmetric and tongue and uvula move midline.  Neck ROM : rotation, tilt and flexion extension were normal for age and shoulder shrug was symmetrical.    Motor exam:  Symmetric bulk, tone and  ROM.   Normal tone without cog wheeling, symmetric grip strength .   Sensory:  Fine touch and vibration ,  affected in small finger and ring finger right side, radiating from  the neck, ulnar nerve.  Proprioception tested in the upper extremities was normal.   Coordination: Rapid alternating movements in the fingers/hands were of normal speed.  The Finger-to-nose maneuver was intact without evidence of ataxia, dysmetria or tremor.   Gait  and station: Patient could rise unassisted from a seated position, walked without assistive device.  Stance is of normal width/ base and the patient turned with 3 steps.  Toe and heel walk were deferred.  Deep tendon reflexes: in the  upper and lower extremities are symmetric and intact.     ASSESSMENT AND PLAN 33 y.o. year old female  here with:  HYPERSOMNIA>   excessive sleepiness.     1) Excessive daytime sleepiness all her life !! Epworth SS at 17/ 24 points.   2) Vivid dreams, no sleep paralysis, hypnopompic hallucinations, no cataplexy.   3) not refreshed by power naps.   4) has been snoring, had gained weight with and after depression,  lost 2 pregnancies  at 9 weeks. SSA positive.  One time ANA positive. Many risk factors for OSA are BMI, neck size and allergic rhinitis, mouth breathing, dental status.    HST first- screening for OSA and initiating treatment .  Watch pat please, needs HST before we may need to do MSLT.  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   I plan to follow up either personally or through our NP within 3-5 months.   I would like to thank Gerre Scull, NP and Gerre Scull, Np [address] [place],  [place] [address] for allowing me to meet with and to take care of this pleasant patient.     After spending a total time of  45  minutes face to face and additional time for physical and neurologic examination, review of laboratory studies,  personal review of imaging studies, reports and results of other testing and review of referral information / records as far as provided in visit,   Electronically signed by: Melvyn Novas, MD 09/25/2023 9:02 AM  [facility] Board certified by The [employer] of Sleep Medicine and Diplomate of the [employer] of Sleep Medicine. Board certified In Neurology through the ABPN, Fellow of the [employer] of Neurology.

## 2023-10-01 NOTE — Therapy (Unsigned)
 OUTPATIENT PHYSICAL THERAPY TREATMENT NOTE   Patient Name: Leslie Arroyo MRN: [id number] DOB:18-Jul-1990, 33 y.o., female Today's Date: 10/03/2023  END OF SESSION:  PT End of Session - 10/03/23 1615     Visit Number 7    Number of Visits 12    Date for PT Re-Evaluation 09/24/23    Authorization Type UHC    Authorization Time Period VL 20    Authorization - Number of Visits 20    Activity Tolerance Patient tolerated treatment well    Behavior During Therapy [facility] for tasks assessed/performed                Past Medical History:  Diagnosis Date   Kidney stones    Prediabetes    Past Surgical History:  Procedure Laterality Date   EXTRACORPOREAL SHOCK WAVE LITHOTRIPSY Left 09/16/2018   Procedure: EXTRACORPOREAL SHOCK WAVE LITHOTRIPSY (ESWL);  Surgeon: Rene Paci, MD;  Location: [facility];  Service: Urology;  Laterality: Left;   HARDWARE REMOVAL Right    leg   KNEE ARTHROSCOPY Left    LEG SURGERY Right    WISDOM TOOTH EXTRACTION     Patient Active Problem List   Diagnosis Date Noted   Excessive daytime sleepiness 09/25/2023   Vivid dream 09/25/2023   Snoring 09/25/2023   Hypersomnia with sleep apnea 09/25/2023   Positive ANA (antinuclear antibody) 02/08/2023   Rash and other nonspecific skin eruption 01/03/2023   Urticaria 01/03/2023   Other allergic rhinitis 01/03/2023   Elevated blood pressure reading 01/03/2023   Sore throat 11/20/2022   Gastroesophageal reflux disease 07/10/2022   Primary hypertension 05/03/2022   Morbid obesity (HCC) 05/03/2022   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   Prediabetes 05/30/2021   Family history of kidney stones 10/24/2017   History of kidney stones 08/08/2017    PCP: Gerre Scull, NP   REFERRING PROVIDER: Salvatore Decent, FNP  REFERRING DIAG: [id number] (ICD-10-CM) - Cervical radiculopathy  THERAPY DIAG:  Radiculopathy, cervical region  Chronic right shoulder pain  Muscle weakness  (generalized)  Rationale for Evaluation and Treatment: Rehabilitation  ONSET DATE: 06/2023  SUBJECTIVE:                                                                                                                                                                                                         SUBJECTIVE STATEMENT: Symptom lessened following treatments but return eventually.  Agreeable to DN today  EVAL: Describes a history or R sided arm pain brought on by prolonged positioning and computer work.  Symptoms described as diffuse  N&T as well as paresthesias not following any distinct dermatome.  Symptoms resolved with position changes. Hand dominance: Right  PERTINENT HISTORY:  HPI: Discussed the use of AI scribe software for clinical note transcription with the patient, who gave verbal consent to proceed.   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 They describe the pain as a dull, tugging sensation that radiates from the back of their neck down to their right arm, occasionally causing numbness. The pain is exacerbated by prolonged computer use and is only partially relieved by dual-action Aleve. The patient has a history of a dislocated right shoulder blade in high school and scoliosis, and has been working extensively on the computer for the past few years. They also report occasional right-sided weakness, particularly noticeable when holding objects like a coffee cup. The patient has tried ibuprofen for pain relief, but it has been ineffective.  PAIN:  Are you having pain? Yes: NPRS scale: 10/10 Pain location: R shoulder/UT Pain description: ache Aggravating factors: computer work Relieving factors: rest, medication  PRECAUTIONS: None  RED FLAGS: None     WEIGHT BEARING RESTRICTIONS: No  FALLS:  Has patient fallen in last 6 months? No  OCCUPATION: Advertising account executive  PLOF: Independent  PATIENT GOALS: To mange my symptoms  NEXT MD VISIT: TBD  OBJECTIVE:  Note: Objective measures were completed at Evaluation unless otherwise noted.  DIAGNOSTIC FINDINGS:  none  PATIENT SURVEYS:  FOTO 61(75 predicted) 09/21/23 68  POSTURE: rounded shoulders and forward head  PALPATION: TTP R UT and Scalenes  09/21/23 TTP R UT, R scalene group and infraspinatus  CERVICAL ROM:   Active ROM A/PROM (deg) eval 09/21/23  Flexion 90% 100%  Extension 90% 100%  Right lateral flexion 90% 100%  Left lateral flexion 75% 100%  Right rotation 90% 100%  Left rotation 90% 100%   (Blank rows = not tested)  UPPER EXTREMITY ROM: WNL throughout  Active ROM Right eval Left eval  Shoulder flexion    Shoulder extension    Shoulder abduction    Shoulder adduction    Shoulder extension    Shoulder internal rotation    Shoulder external rotation    Elbow flexion    Elbow extension    Wrist flexion    Wrist extension    Wrist ulnar deviation    Wrist radial deviation    Wrist pronation    Wrist supination     (Blank rows = not tested)  UPPER EXTREMITY MMT:  MMT Right eval Left eval  Shoulder flexion    Shoulder extension    Shoulder abduction    Shoulder adduction    Shoulder extension    Shoulder internal rotation    Shoulder external rotation    Middle trapezius    Lower trapezius    Elbow flexion    Elbow extension    Wrist flexion    Wrist extension    Wrist ulnar deviation    Wrist radial deviation    Wrist pronation    Wrist supination    Grip strength/pinch 62/11 58/11       (Blank rows = not tested)  CERVICAL SPECIAL TESTS:  Neck flexor muscle endurance test: Negative, Upper limb tension test (ULTT): Positive, and Spurling's test: Negative  FUNCTIONAL TESTS:  30 seconds chair stand test 14  TREATMENT DATE: [facility] Adult PT Treatment:  DATE: 10/03/23 Therapeutic Exercise: UBE 3/3  min L1 Manual Therapy: Skilled palpation to identify taught and irritable bands in R UT Trigger Point Dry Needling  Initial Treatment: Pt instructed on Dry Needling rational, procedures, and possible side effects. Pt instructed to expect mild to moderate muscle soreness later in the day and/or into the next day.  Pt instructed in methods to reduce muscle soreness. Pt instructed to continue prescribed HEP. Patient was educated on signs and symptoms of infection and other risk factors and advised to seek medical attention should they occur.  Patient verbalized understanding of these instructions and education.   Patient Verbal Consent Given: Yes Education Handout Provided: Previously Provided Muscles Treated: R UT Electrical Stimulation Performed: No Treatment Response/Outcome: twitch response followed by decreased tissue tension and discomfort    Therapeutic Activity: R Scalene stretch 30s x3 R UT stretch 30s x2 Omega high row 20# x15 Omega low row 20# x15 Omega lat pulldown 20# x15  [facility] Adult PT Treatment:                                                DATE: 09/21/23 Therapeutic Exercise: HEP review and update incorporating postural retraining  Supine hor abd RTB 15x Doorway stretch 30s x3 Manual Therapy: R first rib mob 5x10 R first rib MET into FF 5x5  Therapeutic Activity: FOTO retake/re-assess R scalene stretch seated 30s x3 R scalene stretch supine 30s x3  [facility] Adult PT Treatment:                                                 09/15/2023 Therapeutic Exercise  UBE unilateral CW/CCW 1'x1' (4 minutes total) 2x10 single arm serratus punch up (6lbs) 3x10 cable column pull downs (bilateral 7lbs) 2x15 cervical SNAGs for extension 2 minutes self STM with tennis ball to rhomboids  Manual Therapy STM right upper trap, scalenes, suboccipitals Median nerve glides  Neuro Re-ed 2x10 standing shoulder sweeps with chin tuck for postural control (1lbs) 3x30 seconds bus  drivers for neuromotor endurance 10 reps wall clocks with 1kg ball for neuromotor control and postural control  DATE: 09/08/23 Therapeutic Exercise: UBE Level 1 3'/3' fwd/bwd 3x10 cable column pull downs 2 cables 7lbs ea Neuromuscular re-ed: 10 reps shoulder wall clocks 500 g ball for neuromotor endurance Seated horizontal abd with chin tuck RTB 2x10 Median nerve glide tray x20 Rt 3x10 cable column rows 2 cable x10lbs ea for postural stability Back against wall 500g ball hand off abduction x10 Supine on foam roller x10 ea: protraction/retraction, alternating flexion/extension, circles CW/CCW, horizontal abduction RTB Supine pec stretch on foam roller x30"   [facility] Adult PT Treatment:                                                 09/01/2023 Therapeutic Exercise: UBE Level 1 2'/2' fwd/bwd 15 reps cervical extension SNAGs with towel 3x10 cable column pull downs 7lbs Self STM tennis ball to rhomboids and upper trap x3 minutes  Manual Therapy STM to right upper trap, suboccipital, and forearm Median nerve glides  Neuro Re-ed 3x10 horizontal  abduction with chin tuck for postural re-ed (RTB) 10 reps shoulder wall clocks 500 g ball for neuromotor endurance 3x10 cable column rows 13lbs for postural stability  DATE: 08/25/23 Therapeutic Exercise: UBE level 1 3'/3' fwd/bwd while gathering subjective and planning session with patient Lat pull down 35# 2x10 High/low rows 35# 2x10 Shoulder extension BlueTB 2x10 Seated BIL ER with scap retraction RTB x10 Median nerve glide tray x15 Seated chin tucks x15 Self Care: Theracane self instruction Office posture and desk ergonomics     PATIENT EDUCATION:  Education details: Discussed eval findings, rehab rationale and POC and patient is in agreement  Person educated: Patient Education method: Explanation Education comprehension: verbalized understanding and needs further education  HOME EXERCISE PROGRAM: Access Code: [email or web address] URL:  [email or web address] Date: 09/21/2023 Prepared by: Gustavus Bryant  Exercises - Seated Cervical Sidebending Stretch  - 1 x daily - 5 x weekly - 1 sets - 2 reps - 30s hold - Standing Shoulder External Rotation with Resistance  - 1 x daily - 5 x weekly - 2 sets - 10 reps - Supine Shoulder Horizontal Abduction with Resistance  - 1 x daily - 5 x weekly - 2 sets - 15 reps - Doorway Pec Stretch at 90 Degrees Abduction  - 1 x daily - 5 x weekly - 1 sets - 3 reps - 30s hold  Patient Education - Office Posture  Patient Education - Office Posture  ASSESSMENT:  CLINICAL IMPRESSION: Good tolerance of DN technique.  Less tissue tension noted following task but no distinct recognition of discomfort level.  Followed needling with w/u and postural retraining exercises.  Continued manual stretching to identify and detect improved tissue mobility.  EVAL: Patient is a 33 y.o. female who was seen today for physical therapy evaluation and treatment for myofacial R UE symptoms brought due to underlying soft tissue restrictions and TrPs in R UT and scalene group. R shoulder ROM WNL and no strength deficits identified.  Cervical ROM only slightly restricted in L SB with ULTT recreating patient symptoms.   OBJECTIVE IMPAIRMENTS: decreased knowledge of condition, decreased ROM, increased fascial restrictions, impaired perceived functional ability, impaired UE functional use, postural dysfunction, and pain.   ACTIVITY LIMITATIONS: carrying, lifting, sitting, and computer work  PERSONAL FACTORS: Age, Past/current experiences, and 1 comorbidity: positive ANA  are also affecting patient's functional outcome.   REHAB POTENTIAL: Good  CLINICAL DECISION MAKING: Stable/uncomplicated  EVALUATION COMPLEXITY: Low   GOALS: Goals reviewed with patient? No  SHORT TERM GOALS: Target date: 08/20/2023  Patient to demonstrate independence in HEP  Baseline: [id number] Goal status: Met    LONG TERM GOALS:  Target date: 09/10/2023  Patient will acknowledge 4/10 pain at least once during episode of care   Baseline: 10/10; 09/21/23 6/10 Goal status: Ongoing  2.  Patient will score at least 75% on FOTO to signify clinically meaningful improvement in functional abilities.   Baseline: 61%; 09/21/23 68% Goal status: Ongoing  3.  Increase L SB to 90% Baseline:  Active ROM A/PROM (deg) eval 09/21/23  Flexion 90% 100%  Extension 90% 100%  Right lateral flexion 90% 100%  Left lateral flexion 75% 100%  Right rotation 90% 100%  Left rotation 90% 100%    Goal status: Met  4.  Minimal irritation with RUE ULTT Baseline: Moderate irritation Goal status: INITIAL     PLAN:  PT FREQUENCY: 1x/week  PT DURATION: 4 weeks  PLANNED INTERVENTIONS: 97164- PT Re-evaluation, 97110-Therapeutic exercises, 97530- Therapeutic activity, [id number]- Neuromuscular re-education, 97535- Self  Care, 13086- Manual therapy, Dry Needling, Joint mobilization, and Spinal mobilization  PLAN FOR NEXT SESSION: HEP review and update, manual techniques as appropriate, aerobic tasks, ROM and flexibility activities, strengthening and PREs, TPDN, gait and balance training as needed     Hildred Laser, PT 10/03/2023, 5:09 PM

## 2023-10-03 ENCOUNTER — Ambulatory Visit: Attending: Internal Medicine

## 2023-10-03 DIAGNOSIS — G8929 Other chronic pain: Secondary | ICD-10-CM | POA: Insufficient documentation

## 2023-10-03 DIAGNOSIS — M6281 Muscle weakness (generalized): Secondary | ICD-10-CM | POA: Diagnosis present

## 2023-10-03 DIAGNOSIS — M25511 Pain in right shoulder: Secondary | ICD-10-CM | POA: Insufficient documentation

## 2023-10-03 DIAGNOSIS — M5412 Radiculopathy, cervical region: Secondary | ICD-10-CM | POA: Diagnosis present

## 2023-10-04 ENCOUNTER — Encounter: Payer: Self-pay | Admitting: Nurse Practitioner

## 2023-10-08 ENCOUNTER — Ambulatory Visit

## 2023-10-08 ENCOUNTER — Ambulatory Visit: Payer: 59 | Admitting: Allergy

## 2023-10-08 DIAGNOSIS — M5412 Radiculopathy, cervical region: Secondary | ICD-10-CM | POA: Diagnosis not present

## 2023-10-08 DIAGNOSIS — G8929 Other chronic pain: Secondary | ICD-10-CM

## 2023-10-08 DIAGNOSIS — M6281 Muscle weakness (generalized): Secondary | ICD-10-CM

## 2023-10-08 NOTE — Therapy (Signed)
 OUTPATIENT PHYSICAL THERAPY TREATMENT NOTE   Patient Name: Leslie Arroyo MRN: [id number] DOB:[birth year], 33 y.o., female Today's Date: 10/08/2023  END OF SESSION:  PT End of Session - 10/08/23 1621     Visit Number 8    Number of Visits 12    Date for PT Re-Evaluation 09/24/23    Authorization Type [employer]    Authorization Time Period VL 20    Authorization - Number of Visits 20    PT Start Time 1615    PT Stop Time 1700    PT Time Calculation (min) 45 min    Activity Tolerance Patient tolerated treatment well    Behavior During Therapy WFL for tasks assessed/performed                Past Medical History:  Diagnosis Date   Kidney stones    Prediabetes    Past Surgical History:  Procedure Laterality Date   EXTRACORPOREAL SHOCK WAVE LITHOTRIPSY Left 09/16/2018   Procedure: EXTRACORPOREAL SHOCK WAVE LITHOTRIPSY (ESWL);  Surgeon: Rene Paci, MD;  Location: [facility];  Service: Urology;  Laterality: Left;   HARDWARE REMOVAL Right    leg   KNEE ARTHROSCOPY Left    LEG SURGERY Right    WISDOM TOOTH EXTRACTION     Patient Active Problem List   Diagnosis Date Noted   Excessive daytime sleepiness 09/25/2023   Vivid dream 09/25/2023   Snoring 09/25/2023   Hypersomnia with sleep apnea 09/25/2023   Positive ANA (antinuclear antibody) 02/08/2023   Rash and other nonspecific skin eruption 01/03/2023   Urticaria 01/03/2023   Other allergic rhinitis 01/03/2023   Elevated blood pressure reading 01/03/2023   Sore throat 11/20/2022   Gastroesophageal reflux disease 07/10/2022   Primary hypertension 05/03/2022   Morbid obesity (HCC) 05/03/2022   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   Prediabetes 05/30/2021   Family history of kidney stones 10/24/2017   History of kidney stones 08/08/2017    PCP: Gerre Scull, NP   REFERRING PROVIDER: Salvatore Decent, FNP  REFERRING DIAG: [id number] (ICD-10-CM) - Cervical radiculopathy  THERAPY DIAG:   Radiculopathy, cervical region  Chronic right shoulder pain  Muscle weakness (generalized)  Rationale for Evaluation and Treatment: Rehabilitation  ONSET DATE: 06/2023  SUBJECTIVE:                                                                                                                                                                                                         SUBJECTIVE STATEMENT: Feels better but did experience increased soreness from DN, isolated to R  UT and levator with some c/o R side neck pain  EVAL: Describes a history or R sided arm pain brought on by prolonged positioning and computer work.  Symptoms described as diffuse N&T as well as paresthesias not following any distinct dermatome.  Symptoms resolved with position changes. Hand dominance: Right  PERTINENT HISTORY:  HPI: Discussed the use of AI scribe software for clinical note transcription with the patient, who gave verbal consent to proceed.   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 They describe the pain as a dull, tugging sensation that radiates from the back of their neck down to their right arm, occasionally causing numbness. The pain is exacerbated by prolonged computer use and is only partially relieved by dual-action Aleve. The patient has a history of a dislocated right shoulder blade in high school and scoliosis, and has been working extensively on the computer for the past few years. They also report occasional right-sided weakness, particularly noticeable when holding objects like a coffee cup. The patient has tried ibuprofen for pain relief, but it has been ineffective.  PAIN:  Are you having pain? Yes: NPRS scale: 10/10 Pain location: R shoulder/UT Pain description: ache Aggravating factors: computer work Relieving factors: rest, medication  PRECAUTIONS: None  RED  FLAGS: None     WEIGHT BEARING RESTRICTIONS: No  FALLS:  Has patient fallen in last 6 months? No  OCCUPATION: Risk analyst  PLOF: Independent  PATIENT GOALS: To mange my symptoms  NEXT MD VISIT: TBD  OBJECTIVE:  Note: Objective measures were completed at Evaluation unless otherwise noted.  DIAGNOSTIC FINDINGS:  none  PATIENT SURVEYS:  FOTO 61(75 predicted) 09/21/23 68  POSTURE: rounded shoulders and forward head  PALPATION: TTP R UT and Scalenes  09/21/23 TTP R UT, R scalene group and infraspinatus  CERVICAL ROM:   Active ROM A/PROM (deg) eval 09/21/23  Flexion 90% 100%  Extension 90% 100%  Right lateral flexion 90% 100%  Left lateral flexion 75% 100%  Right rotation 90% 100%  Left rotation 90% 100%   (Blank rows = not tested)  UPPER EXTREMITY ROM: WNL throughout  Active ROM Right eval Left eval  Shoulder flexion    Shoulder extension    Shoulder abduction    Shoulder adduction    Shoulder extension    Shoulder internal rotation    Shoulder external rotation    Elbow flexion    Elbow extension    Wrist flexion    Wrist extension    Wrist ulnar deviation    Wrist radial deviation    Wrist pronation    Wrist supination     (Blank rows = not tested)  UPPER EXTREMITY MMT:  MMT Right eval Left eval  Shoulder flexion    Shoulder extension    Shoulder abduction    Shoulder adduction    Shoulder extension    Shoulder internal rotation    Shoulder external rotation    Middle trapezius    Lower trapezius    Elbow flexion    Elbow extension    Wrist flexion    Wrist extension    Wrist ulnar deviation    Wrist radial deviation    Wrist pronation    Wrist supination    Grip strength/pinch 62/11 58/11       (Blank rows = not tested)  CERVICAL SPECIAL TESTS:  Neck flexor muscle endurance test: Negative, Upper limb tension test (  ULTT): Positive, and Spurling's test: Negative  FUNCTIONAL TESTS:  30 seconds chair stand test  14  TREATMENT DATE: [facility] Adult PT Treatment:                                                DATE: 10/08/23 Therapeutic Exercise: UBE L1 3/3 min Omega high row 20# Omega low row 20# Omega lat pulldown 20# Manual Therapy: Skilled palpation to identify taught and irritable bands in R UT and levator TrP release to R levator 2 min R scalene group stertch 30s x3 Trigger Point Dry Needling  Subsequent Treatment: Instructions provided previously at initial dry needling treatment.   Patient Verbal Consent Given: Yes Education Handout Provided: Previously Provided Muscles Treated: R UT Electrical Stimulation Performed: No Treatment Response/Outcome: twitch response f/b decreased tension in soft tissue     [facility] Adult PT Treatment:                                                DATE: 10/03/23 Therapeutic Exercise: UBE 3/3 min L1 Manual Therapy: Skilled palpation to identify taught and irritable bands in R UT Trigger Point Dry Needling  Initial Treatment: Pt instructed on Dry Needling rational, procedures, and possible side effects. Pt instructed to expect mild to moderate muscle soreness later in the day and/or into the next day.  Pt instructed in methods to reduce muscle soreness. Pt instructed to continue prescribed HEP. Patient was educated on signs and symptoms of infection and other risk factors and advised to seek medical attention should they occur.  Patient verbalized understanding of these instructions and education.   Patient Verbal Consent Given: Yes Education Handout Provided: Previously Provided Muscles Treated: R UT Electrical Stimulation Performed: No Treatment Response/Outcome: twitch response followed by decreased tissue tension and discomfort    Therapeutic Activity: R Scalene stretch 30s x3 R UT stretch 30s x2 Omega high row 20# x15 Omega low row 20# x15 Omega lat pulldown 20# x15  [facility] Adult PT Treatment:                                                DATE:  09/21/23 Therapeutic Exercise: HEP review and update incorporating postural retraining  Supine hor abd RTB 15x Doorway stretch 30s x3 Manual Therapy: R first rib mob 5x10 R first rib MET into FF 5x5  Therapeutic Activity: FOTO retake/re-assess R scalene stretch seated 30s x3 R scalene stretch supine 30s x3  [facility] Adult PT Treatment:                                                 09/15/2023 Therapeutic Exercise  UBE unilateral CW/CCW 1'x1' (4 minutes total) 2x10 single arm serratus punch up (6lbs) 3x10 cable column pull downs (bilateral 7lbs) 2x15 cervical SNAGs for extension 2 minutes self STM with tennis ball to rhomboids  Manual Therapy STM right upper trap, scalenes, suboccipitals Median nerve glides  Neuro Re-ed 2x10 standing shoulder sweeps with  chin tuck for postural control (1lbs) 3x30 seconds bus drivers for neuromotor endurance 10 reps wall clocks with 1kg ball for neuromotor control and postural control  DATE: 09/08/23 Therapeutic Exercise: UBE Level 1 3'/3' fwd/bwd 3x10 cable column pull downs 2 cables 7lbs ea Neuromuscular re-ed: 10 reps shoulder wall clocks 500 g ball for neuromotor endurance Seated horizontal abd with chin tuck RTB 2x10 Median nerve glide tray x20 Rt 3x10 cable column rows 2 cable x10lbs ea for postural stability Back against wall 500g ball hand off abduction x10 Supine on foam roller x10 ea: protraction/retraction, alternating flexion/extension, circles CW/CCW, horizontal abduction RTB Supine pec stretch on foam roller x30"   [facility] Adult PT Treatment:                                                 09/01/2023 Therapeutic Exercise: UBE Level 1 2'/2' fwd/bwd 15 reps cervical extension SNAGs with towel 3x10 cable column pull downs 7lbs Self STM tennis ball to rhomboids and upper trap x3 minutes  Manual Therapy STM to right upper trap, suboccipital, and forearm Median nerve glides  Neuro Re-ed 3x10 horizontal abduction with chin tuck for  postural re-ed (RTB) 10 reps shoulder wall clocks 500 g ball for neuromotor endurance 3x10 cable column rows 13lbs for postural stability  DATE: 08/25/23 Therapeutic Exercise: UBE level 1 3'/3' fwd/bwd while gathering subjective and planning session with patient Lat pull down 35# 2x10 High/low rows 35# 2x10 Shoulder extension BlueTB 2x10 Seated BIL ER with scap retraction RTB x10 Median nerve glide tray x15 Seated chin tucks x15 Self Care: Theracane self instruction Office posture and desk ergonomics     PATIENT EDUCATION:  Education details: Discussed eval findings, rehab rationale and POC and patient is in agreement  Person educated: Patient Education method: Explanation Education comprehension: verbalized understanding and needs further education  HOME EXERCISE PROGRAM: Access Code: [email or web address] URL: [email or web address] Date: 10/08/2023 Prepared by: Gustavus Bryant  Exercises - Seated Cervical Sidebending Stretch  - 1 x daily - 5 x weekly - 1 sets - 2 reps - 30s hold - Standing Shoulder External Rotation with Resistance  - 1 x daily - 5 x weekly - 2 sets - 10 reps - Supine Shoulder Horizontal Abduction with Resistance  - 1 x daily - 5 x weekly - 2 sets - 15 reps - Doorway Pec Stretch at 90 Degrees Abduction  - 1 x daily - 5 x weekly - 1 sets - 3 reps - 30s hold - Seated Levator Scapulae Stretch on Wall  - 1 x daily - 5 x weekly - 1 sets - 3 reps - 30s hold  Patient Education - Office Posture  ASSESSMENT:  CLINICAL IMPRESSION: Full R rotation afforded following DN and subsequent manual stretching.  Introduced isotonic exercises to promote postural retraining and strengthen posterior chain.  Scalene tightness remains and affecting position of first rib with shoulder tasks.  EVAL: Patient is a 33 y.o. female who was seen today for physical therapy evaluation and treatment for myofacial R UE symptoms brought due to underlying soft tissue restrictions and TrPs in  R UT and scalene group. R shoulder ROM WNL and no strength deficits identified.  Cervical ROM only slightly restricted in L SB with ULTT recreating patient symptoms.   OBJECTIVE IMPAIRMENTS: decreased knowledge of condition, decreased ROM, increased fascial restrictions, impaired perceived functional ability,  impaired UE functional use, postural dysfunction, and pain.   ACTIVITY LIMITATIONS: carrying, lifting, sitting, and computer work  PERSONAL FACTORS: Age, Past/current experiences, and 1 comorbidity: positive ANA  are also affecting patient's functional outcome.   REHAB POTENTIAL: Good  CLINICAL DECISION MAKING: Stable/uncomplicated  EVALUATION COMPLEXITY: Low   GOALS: Goals reviewed with patient? No  SHORT TERM GOALS: Target date: 08/20/2023  Patient to demonstrate independence in HEP  Baseline: [id number] Goal status: Met    LONG TERM GOALS: Target date: 09/10/2023  Patient will acknowledge 4/10 pain at least once during episode of care   Baseline: 10/10; 09/21/23 6/10 Goal status: Ongoing  2.  Patient will score at least 75% on FOTO to signify clinically meaningful improvement in functional abilities.   Baseline: 61%; 09/21/23 68% Goal status: Ongoing  3.  Increase L SB to 90% Baseline:  Active ROM A/PROM (deg) eval 09/21/23  Flexion 90% 100%  Extension 90% 100%  Right lateral flexion 90% 100%  Left lateral flexion 75% 100%  Right rotation 90% 100%  Left rotation 90% 100%    Goal status: Met  4.  Minimal irritation with RUE ULTT Baseline: Moderate irritation Goal status: INITIAL     PLAN:  PT FREQUENCY: 1x/week  PT DURATION: 4 weeks  PLANNED INTERVENTIONS: 97164- PT Re-evaluation, 97110-Therapeutic exercises, 97530- Therapeutic activity, 97112- Neuromuscular re-education, 97535- Self Care, 54098- Manual therapy, Dry Needling, Joint mobilization, and Spinal mobilization  PLAN FOR NEXT SESSION: HEP review and update, manual techniques as appropriate,  aerobic tasks, ROM and flexibility activities, strengthening and PREs, TPDN, gait and balance training as needed     Hildred Laser, PT 10/08/2023, 5:04 PM

## 2023-10-09 ENCOUNTER — Ambulatory Visit: Admitting: Neurology

## 2023-10-09 DIAGNOSIS — R6889 Other general symptoms and signs: Secondary | ICD-10-CM

## 2023-10-09 DIAGNOSIS — R442 Other hallucinations: Secondary | ICD-10-CM

## 2023-10-09 DIAGNOSIS — G4710 Hypersomnia, unspecified: Secondary | ICD-10-CM | POA: Diagnosis not present

## 2023-10-09 DIAGNOSIS — G4719 Other hypersomnia: Secondary | ICD-10-CM

## 2023-10-09 DIAGNOSIS — R0683 Snoring: Secondary | ICD-10-CM

## 2023-10-10 NOTE — Progress Notes (Signed)
 [facility] 33 year old female 1991-01-12   HOME SLEEP TEST REPORT ( by Watch PAT)   STUDY DATE:  10-09-2023   ORDERING CLINICIAN:  Neomia Banner, MD  REFERRING CLINICIAN: Odette Benjamin, NP [address] [place] [address]    CLINICAL INFORMATION/HISTORY:  lifelong sleepiness . Chief concern according to patient :   I am the sleepiest person in the family, as a baby I fell asleep on the potty and while playing hide and seek. " " I am in PT for  the right shoulder , cervical radiculopathy,  have HTN and  pre-diabetes on Metformin . Positive ANA followed by Dr Nicholas Bari "   Sleep relevant medical history: Obesity, Nocturia 0-1, Sleep attacks,No ENT surgery, hx of shoulder dislocation ,rhinitis,     Family medical /sleep history: Mother with OSA, .      Epworth sleepiness score: 18/ 24 points  (!!) FSS endorsed at 33/ 63 points.    BMI: 41 kg/m   Neck Circumference: 16.5 "   FINDINGS:   Sleep Summary:   Total Recording Time (hours, min): 7 hours 51 minutes      Total Sleep Time (hours, min): 7 hours 11 minutes               Percent REM (%): 24.5                                      Respiratory Indices:   Calculated pAHI (per hour): 10.1/h                           REM pAHI:    19.5/h                                             NREM pAHI:    7/h                          Positional AHI:    There were 233 minutes of supine sleep recorded with an AHI of 12.8/h following AASM criteria of scoring.  Nonsupine sleep was present 499 minutes and reached an AHI of 7/h.  The lowest AHI was seen during prone sleep with 5.5/h.  Snoring level reached a mean volume of 43 dB which is moderately loud but was present for about 50% of total sleep time.  Sleep latency was 16 minutes and REM sleep latency was 68 minutes long.  The patient was awake after sleep onset for another 24 minutes.                                                Oxygen Saturation Statistics:   Oxygen Saturation (%) Mean:   93%    Between the nadir at 86% and a maximum of 99%                          O2 Saturation (minutes) <89%:    0 minutes       Pulse  Rate Statistics:   Pulse Mean (bpm):    71 bpm             Pulse Range:    Between 59 and 103 bpm.    Please note that this home sleep test device cannot give cardiac rhythm information.             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  This is an uncomplicated all obstructive sleep apnea that should be treated with positive airway pressure therapy.  A dental device will not reduce REM sleep apnea which is the dominant form during this home sleep test.  Please note that weight loss is of great importance to reduce the overall tendency to hypoventilate and also reduces the apnea hypopnea index overall as well as snoring volume.   RECOMMENDATION: Starting with an auto -titrating CPAP device ResMed 11 with a setting between 5 and 12 cm water pressure 2 cm EPR, heated humidification and mask of choice and comfort.   This patient will be advised to avoid sleeping in supine, please consider that with the mask fitting.    INTERPRETING PHYSICIAN:   Neomia Banner, MD

## 2023-10-30 ENCOUNTER — Encounter: Payer: Self-pay | Admitting: Neurology

## 2023-10-30 DIAGNOSIS — R442 Other hallucinations: Secondary | ICD-10-CM | POA: Insufficient documentation

## 2023-10-30 NOTE — Procedures (Signed)
 [facility] 33 year old female 02-22-1991   HOME SLEEP TEST REPORT ( by Watch PAT)   STUDY DATE:  10-09-2023   ORDERING CLINICIAN:  Neomia Banner, MD  REFERRING CLINICIAN: Odette Benjamin, NP [address] [place] [address]    CLINICAL INFORMATION/HISTORY:  lifelong sleepiness . Chief concern according to patient :   I am the sleepiest person in the family, as a baby I fell asleep on the potty and while playing hide and seek. " " I am in PT for  the right shoulder , cervical radiculopathy,  have HTN and  pre-diabetes on Metformin . Positive ANA followed by Dr Nicholas Bari "   Sleep relevant medical history: Obesity, Nocturia 0-1, Sleep attacks,No ENT surgery, hx of shoulder dislocation ,rhinitis,     Family medical /sleep history: Mother with OSA, .      Epworth sleepiness score: 18/ 24 points  (!!) FSS endorsed at 33/ 63 points.    BMI: 41 kg/m   Neck Circumference: 16.5 "   FINDINGS:   Sleep Summary:   Total Recording Time (hours, min): 7 hours 51 minutes      Total Sleep Time (hours, min): 7 hours 11 minutes               Percent REM (%): 24.5                                      Respiratory Indices:   Calculated pAHI (per hour): 10.1/h                           REM pAHI:    19.5/h                                             NREM pAHI:    7/h                          Positional AHI:    There were 233 minutes of supine sleep recorded with an AHI of 12.8/h following AASM criteria of scoring.  Nonsupine sleep was present 499 minutes and reached an AHI of 7/h.  The lowest AHI was seen during prone sleep with 5.5/h.  Snoring level reached a mean volume of 43 dB which is moderately loud but was present for about 50% of total sleep time.  Sleep latency was 16 minutes and REM sleep latency was 68 minutes long.  The patient was awake after sleep onset for another 24 minutes.                                                Oxygen Saturation Statistics:   Oxygen Saturation (%) Mean:   93%    Between the nadir at 86% and a maximum of 99%                          O2 Saturation (minutes) <89%:    0 minutes       Pulse Rate  Statistics:   Pulse Mean (bpm):    71 bpm             Pulse Range:    Between 59 and 103 bpm.    Please note that this home sleep test device cannot give cardiac rhythm information.             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  This is an uncomplicated all obstructive sleep apnea that should be treated with positive airway pressure therapy.  A dental device will not reduce REM sleep apnea which is the dominant form during this home sleep test.  Please note that weight loss is of great importance to reduce the overall tendency to hypoventilate and also reduces the apnea hypopnea index overall as well as snoring volume.   RECOMMENDATION: Starting with an auto -titrating CPAP device ResMed 11 with a setting between 5 and 12 cm water pressure 2 cm EPR, heated humidification and mask of choice and comfort.   This patient will be advised to avoid sleeping in supine, please consider that with the mask fitting.  It is possible that this degree of sleep apnea is not the single cause of the excessive daytime sleepiness as reflected in an Epworth sleepiness score of 18 points.    Once the patient has used CPAP for 2 to 3 months we should meet again and reevaluate the Epworth score.  If sleepiness is not significantly reduced will  consider evaluating  for narcolepsy or idiopathic hypersomnia.     INTERPRETING PHYSICIAN:   Neomia Banner, MD

## 2023-10-30 NOTE — Addendum Note (Signed)
 Addended by: Neomia Banner on: 10/30/2023 07:32 PM   Modules accepted: Orders

## 2023-11-01 ENCOUNTER — Encounter: Payer: Self-pay | Admitting: Neurology

## 2023-11-05 ENCOUNTER — Ambulatory Visit: Attending: Internal Medicine

## 2023-11-05 DIAGNOSIS — M5412 Radiculopathy, cervical region: Secondary | ICD-10-CM | POA: Diagnosis present

## 2023-11-05 DIAGNOSIS — M6281 Muscle weakness (generalized): Secondary | ICD-10-CM | POA: Diagnosis present

## 2023-11-05 DIAGNOSIS — M25511 Pain in right shoulder: Secondary | ICD-10-CM | POA: Diagnosis present

## 2023-11-05 DIAGNOSIS — G8929 Other chronic pain: Secondary | ICD-10-CM | POA: Diagnosis present

## 2023-11-05 NOTE — Therapy (Signed)
 OUTPATIENT PHYSICAL THERAPY TREATMENT NOTE   Patient Name: Leslie Arroyo MRN: [id number] DOB:Dec 12, 1990, 33 y.o., female Today's Date: 11/05/2023  END OF SESSION:  PT End of Session - 11/05/23 1609     Visit Number 9    Number of Visits 12    Date for PT Re-Evaluation 11/21/23    Authorization Type [employer]    Authorization Time Period VL 20    Authorization - Number of Visits 20    PT Start Time 1615    PT Stop Time 1700    PT Time Calculation (min) 45 min    Activity Tolerance Patient tolerated treatment well    Behavior During Therapy WFL for tasks assessed/performed                 Past Medical History:  Diagnosis Date   Kidney stones    Prediabetes    Past Surgical History:  Procedure Laterality Date   EXTRACORPOREAL SHOCK WAVE LITHOTRIPSY Left 09/16/2018   Procedure: EXTRACORPOREAL SHOCK WAVE LITHOTRIPSY (ESWL);  Surgeon: Adelbert Homans, MD;  Location: [facility];  Service: Urology;  Laterality: Left;   HARDWARE REMOVAL Right    leg   KNEE ARTHROSCOPY Left    LEG SURGERY Right    WISDOM TOOTH EXTRACTION     Patient Active Problem List   Diagnosis Date Noted   Hypnopompic hallucination 10/30/2023   Excessive daytime sleepiness 09/25/2023   Vivid dream 09/25/2023   Snoring 09/25/2023   Hypersomnia with sleep apnea 09/25/2023   Positive ANA (antinuclear antibody) 02/08/2023   Rash and other nonspecific skin eruption 01/03/2023   Urticaria 01/03/2023   Other allergic rhinitis 01/03/2023   Elevated blood pressure reading 01/03/2023   Sore throat 11/20/2022   Gastroesophageal reflux disease 07/10/2022   Primary hypertension 05/03/2022   Morbid obesity (HCC) 05/03/2022   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   Prediabetes 05/30/2021   Family history of kidney stones 10/24/2017   History of kidney stones 08/08/2017    PCP: Odette Benjamin, NP   REFERRING PROVIDER: Gavin Kast, FNP  REFERRING DIAG: [phone] (ICD-10-CM) -  Cervical radiculopathy  THERAPY DIAG:  Radiculopathy, cervical region  Chronic right shoulder pain  Muscle weakness (generalized)  Rationale for Evaluation and Treatment: Rehabilitation  ONSET DATE: 06/2023  SUBJECTIVE:                                                                                                                                                                                                         SUBJECTIVE STATEMENT: Much improved since last PT session.  Has been active with furniture market in town and symptoms have remained stable.  Only reports symptoms in R SO and cervical paraspinal region.  EVAL: Describes a history or R sided arm pain brought on by prolonged positioning and computer work.  Symptoms described as diffuse N&T as well as paresthesias not following any distinct dermatome.  Symptoms resolved with position changes. Hand dominance: Right  PERTINENT HISTORY:  HPI: Discussed the use of AI scribe software for clinical note transcription with the patient, who gave verbal consent to proceed.   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 They describe the pain as a dull, tugging sensation that radiates from the back of their neck down to their right arm, occasionally causing numbness. The pain is exacerbated by prolonged computer use and is only partially relieved by dual-action Aleve. The patient has a history of a dislocated right shoulder blade in high school and scoliosis, and has been working extensively on the computer for the past few years. They also report occasional right-sided weakness, particularly noticeable when holding objects like a coffee cup. The patient has tried ibuprofen  for pain relief, but it has been ineffective.  PAIN:  Are you having pain? Yes: NPRS scale: 10/10 Pain location: R shoulder/UT Pain description: ache Aggravating factors:  computer work Relieving factors: rest, medication  PRECAUTIONS: None  RED FLAGS: None     WEIGHT BEARING RESTRICTIONS: No  FALLS:  Has patient fallen in last 6 months? No  OCCUPATION: Risk analyst  PLOF: Independent  PATIENT GOALS: To mange my symptoms  NEXT MD VISIT: TBD  OBJECTIVE:  Note: Objective measures were completed at Evaluation unless otherwise noted.  DIAGNOSTIC FINDINGS:  none  PATIENT SURVEYS:  FOTO 61(75 predicted) 09/21/23 68 11/05/23 83%  POSTURE: rounded shoulders and forward head  PALPATION: TTP R UT and Scalenes  09/21/23 TTP R UT, R scalene group and infraspinatus  CERVICAL ROM:   Active ROM A/PROM (deg) eval 09/21/23  Flexion 90% 100%  Extension 90% 100%  Right lateral flexion 90% 100%  Left lateral flexion 75% 100%  Right rotation 90% 100%  Left rotation 90% 100%   (Blank rows = not tested)  UPPER EXTREMITY ROM: WNL throughout  Active ROM Right eval Left eval  Shoulder flexion    Shoulder extension    Shoulder abduction    Shoulder adduction    Shoulder extension    Shoulder internal rotation    Shoulder external rotation    Elbow flexion    Elbow extension    Wrist flexion    Wrist extension    Wrist ulnar deviation    Wrist radial deviation    Wrist pronation    Wrist supination     (Blank rows = not tested)  UPPER EXTREMITY MMT:  MMT Right eval Left eval  Shoulder flexion    Shoulder extension    Shoulder abduction    Shoulder adduction    Shoulder extension    Shoulder internal rotation    Shoulder external rotation    Middle trapezius    Lower trapezius    Elbow flexion    Elbow extension    Wrist flexion    Wrist extension    Wrist ulnar deviation    Wrist radial deviation    Wrist pronation    Wrist supination    Grip strength/pinch 62/11 58/11       (Blank rows = not  tested)  CERVICAL SPECIAL TESTS:  Neck flexor muscle endurance test: Negative, Upper limb tension test (ULTT): Positive, and  Spurling's test: Negative  FUNCTIONAL TESTS:  30 seconds chair stand test 14  TREATMENT DATE: [facility] Adult PT Treatment:                                                DATE: 11/05/23 Therapeutic Exercise: UBE L2 3/3 min R levator stretch 30s x2 R scalene stretch 30s x3 Manual Therapy: Skilled palpation to identify taught and irritable bands in R paracervicals/multifidi Trigger Point Dry Needling  Subsequent Treatment: Instructions reviewed, if requested by the patient, prior to subsequent dry needling treatment.   Patient Verbal Consent Given: Yes Education Handout Provided: Previously Provided Muscles Treated: R paracervicals/multifidi Electrical Stimulation Performed: No Treatment Response/Outcome: twitch response/pressure f/b symptom relief   Therapeutic Activity: Omega high row 25# 15x Omega low row 25# 15x Omega lat pulldown 25# 15x  [facility] Adult PT Treatment:                                                DATE: 10/08/23 Therapeutic Exercise: UBE L1 3/3 min Omega high row 20# Omega low row 20# Omega lat pulldown 20# Manual Therapy: Skilled palpation to identify taught and irritable bands in R UT and levator TrP release to R levator 2 min R scalene group stertch 30s x3 Trigger Point Dry Needling  Subsequent Treatment: Instructions provided previously at initial dry needling treatment.   Patient Verbal Consent Given: Yes Education Handout Provided: Previously Provided Muscles Treated: R UT Electrical Stimulation Performed: No Treatment Response/Outcome: twitch response f/b decreased tension in soft tissue     [facility] Adult PT Treatment:                                                DATE: 10/03/23 Therapeutic Exercise: UBE 3/3 min L1 Manual Therapy: Skilled palpation to identify taught and irritable bands in R UT Trigger Point Dry Needling  Initial Treatment: Pt instructed on Dry Needling rational, procedures, and possible side effects. Pt instructed to expect mild to  moderate muscle soreness later in the day and/or into the next day.  Pt instructed in methods to reduce muscle soreness. Pt instructed to continue prescribed HEP. Patient was educated on signs and symptoms of infection and other risk factors and advised to seek medical attention should they occur.  Patient verbalized understanding of these instructions and education.   Patient Verbal Consent Given: Yes Education Handout Provided: Previously Provided Muscles Treated: R UT Electrical Stimulation Performed: No Treatment Response/Outcome: twitch response followed by decreased tissue tension and discomfort    Therapeutic Activity: R Scalene stretch 30s x3 R UT stretch 30s x2 Omega high row 20# x15 Omega low row 20# x15 Omega lat pulldown 20# x15  [facility] Adult PT Treatment:  DATE: 09/21/23 Therapeutic Exercise: HEP review and update incorporating postural retraining  Supine hor abd RTB 15x Doorway stretch 30s x3 Manual Therapy: R first rib mob 5x10 R first rib MET into FF 5x5  Therapeutic Activity: FOTO retake/re-assess R scalene stretch seated 30s x3 R scalene stretch supine 30s x3    PATIENT EDUCATION:  Education details: Discussed eval findings, rehab rationale and POC and patient is in agreement  Person educated: Patient Education method: Explanation Education comprehension: verbalized understanding and needs further education  HOME EXERCISE PROGRAM: Access Code: [email or web address] URL: [email or web address] Date: 10/08/2023 Prepared by: Gretta Leavens  Exercises - Seated Cervical Sidebending Stretch  - 1 x daily - 5 x weekly - 1 sets - 2 reps - 30s hold - Standing Shoulder External Rotation with Resistance  - 1 x daily - 5 x weekly - 2 sets - 10 reps - Supine Shoulder Horizontal Abduction with Resistance  - 1 x daily - 5 x weekly - 2 sets - 15 reps - Doorway Pec Stretch at 90 Degrees Abduction  - 1 x daily - 5 x weekly -  1 sets - 3 reps - 30s hold - Seated Levator Scapulae Stretch on Wall  - 1 x daily - 5 x weekly - 1 sets - 3 reps - 30s hold  Patient Education - Office Posture  ASSESSMENT:  CLINICAL IMPRESSION: Much improved overall.  FOTO goal met.  Able to reach behind w/o triggering symptoms.  Able to tolerate an increased workload w/o symptom aggravation.  EVAL: Patient is a 33 y.o. female who was seen today for physical therapy evaluation and treatment for myofacial R UE symptoms brought due to underlying soft tissue restrictions and TrPs in R UT and scalene group. R shoulder ROM WNL and no strength deficits identified.  Cervical ROM only slightly restricted in L SB with ULTT recreating patient symptoms.   OBJECTIVE IMPAIRMENTS: decreased knowledge of condition, decreased ROM, increased fascial restrictions, impaired perceived functional ability, impaired UE functional use, postural dysfunction, and pain.   ACTIVITY LIMITATIONS: carrying, lifting, sitting, and computer work  PERSONAL FACTORS: Age, Past/current experiences, and 1 comorbidity: positive ANA  are also affecting patient's functional outcome.   REHAB POTENTIAL: Good  CLINICAL DECISION MAKING: Stable/uncomplicated  EVALUATION COMPLEXITY: Low   GOALS: Goals reviewed with patient? No  SHORT TERM GOALS: Target date: 08/20/2023  Patient to demonstrate independence in HEP  Baseline: [id number] Goal status: Met    LONG TERM GOALS: Target date: 09/10/2023  Patient will acknowledge 4/10 pain at least once during episode of care   Baseline: 10/10; 09/21/23 6/10 Goal status: Ongoing  2.  Patient will score at least 75% on FOTO to signify clinically meaningful improvement in functional abilities.   Baseline: 61%; 09/21/23 68%; 11/05/23 83% Goal status: Met  3.  Increase L SB to 90% Baseline:  Active ROM A/PROM (deg) eval 09/21/23  Flexion 90% 100%  Extension 90% 100%  Right lateral flexion 90% 100%  Left lateral flexion 75% 100%   Right rotation 90% 100%  Left rotation 90% 100%    Goal status: Met  4.  Minimal irritation with RUE ULTT Baseline: Moderate irritation Goal status: INITIAL     PLAN:  PT FREQUENCY: 1x/week  PT DURATION: 4 weeks  PLANNED INTERVENTIONS: 97164- PT Re-evaluation, 97110-Therapeutic exercises, 97530- Therapeutic activity, 97112- Neuromuscular re-education, 97535- Self Care, 14782- Manual therapy, Dry Needling, Joint mobilization, and Spinal mobilization  PLAN FOR NEXT SESSION: HEP review and update, manual techniques as appropriate, aerobic tasks, ROM  and flexibility activities, strengthening and PREs, TPDN, gait and balance training as needed     Eastyn Dattilo M Arty Lantzy, PT 11/05/2023, 4:58 PM

## 2023-11-09 NOTE — Therapy (Unsigned)
 OUTPATIENT PHYSICAL THERAPY TREATMENT NOTE   Patient Name: Leslie Arroyo MRN: [id number] DOB:Aug 14, 1990, 33 y.o., female Today's Date: 11/09/2023  END OF SESSION:        Past Medical History:  Diagnosis Date   Kidney stones    Prediabetes    Past Surgical History:  Procedure Laterality Date   EXTRACORPOREAL SHOCK WAVE LITHOTRIPSY Left 09/16/2018   Procedure: EXTRACORPOREAL SHOCK WAVE LITHOTRIPSY (ESWL);  Surgeon: Adelbert Homans, MD;  Location: [facility];  Service: Urology;  Laterality: Left;   HARDWARE REMOVAL Right    leg   KNEE ARTHROSCOPY Left    LEG SURGERY Right    WISDOM TOOTH EXTRACTION     Patient Active Problem List   Diagnosis Date Noted   Hypnopompic hallucination 10/30/2023   Excessive daytime sleepiness 09/25/2023   Vivid dream 09/25/2023   Snoring 09/25/2023   Hypersomnia with sleep apnea 09/25/2023   Positive ANA (antinuclear antibody) 02/08/2023   Rash and other nonspecific skin eruption 01/03/2023   Urticaria 01/03/2023   Other allergic rhinitis 01/03/2023   Elevated blood pressure reading 01/03/2023   Sore throat 11/20/2022   Gastroesophageal reflux disease 07/10/2022   Primary hypertension 05/03/2022   Morbid obesity (HCC) 05/03/2022   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   Prediabetes 05/30/2021   Family history of kidney stones 10/24/2017   History of kidney stones 08/08/2017    PCP: Odette Benjamin, NP   REFERRING PROVIDER: Gavin Kast, FNP  REFERRING DIAG: M54.12 (ICD-10-CM) - Cervical radiculopathy  THERAPY DIAG:  No diagnosis found.  Rationale for Evaluation and Treatment: Rehabilitation  ONSET DATE: 06/2023  SUBJECTIVE:                                                                                                                                                                                                         SUBJECTIVE STATEMENT: Much improved since last PT session.  Has been active  with furniture market in town and symptoms have remained stable.  Only reports symptoms in R SO and cervical paraspinal region.  EVAL: Describes a history or R sided arm pain brought on by prolonged positioning and computer work.  Symptoms described as diffuse N&T as well as paresthesias not following any distinct dermatome.  Symptoms resolved with position changes. Hand dominance: Right  PERTINENT HISTORY:  HPI: Discussed the use of AI scribe software for clinical note transcription with the patient, who gave verbal consent to proceed.   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 They describe the pain as a dull, tugging sensation that radiates from the back of their neck down to their right arm, occasionally causing numbness. The pain is exacerbated by prolonged computer use and is only partially relieved by dual-action Aleve. The patient has a history of a dislocated right shoulder blade in high school and scoliosis, and has been working extensively on the computer for the past few years. They also report occasional right-sided weakness, particularly noticeable when holding objects like a coffee cup. The patient has tried ibuprofen  for pain relief, but it has been ineffective.  PAIN:  Are you having pain? Yes: NPRS scale: 10/10 Pain location: R shoulder/UT Pain description: ache Aggravating factors: computer work Relieving factors: rest, medication  PRECAUTIONS: None  RED FLAGS: None     WEIGHT BEARING RESTRICTIONS: No  FALLS:  Has patient fallen in last 6 months? No  OCCUPATION: Risk analyst  PLOF: Independent  PATIENT GOALS: To mange my symptoms  NEXT MD VISIT: TBD  OBJECTIVE:  Note: Objective measures were completed at Evaluation unless otherwise noted.  DIAGNOSTIC FINDINGS:  none  PATIENT SURVEYS:  FOTO 61(75 predicted) 09/21/23 68 11/05/23 83%  POSTURE: rounded  shoulders and forward head  PALPATION: TTP R UT and Scalenes  09/21/23 TTP R UT, R scalene group and infraspinatus  CERVICAL ROM:   Active ROM A/PROM (deg) eval 09/21/23  Flexion 90% 100%  Extension 90% 100%  Right lateral flexion 90% 100%  Left lateral flexion 75% 100%  Right rotation 90% 100%  Left rotation 90% 100%   (Blank rows = not tested)  UPPER EXTREMITY ROM: WNL throughout  Active ROM Right eval Left eval  Shoulder flexion    Shoulder extension    Shoulder abduction    Shoulder adduction    Shoulder extension    Shoulder internal rotation    Shoulder external rotation    Elbow flexion    Elbow extension    Wrist flexion    Wrist extension    Wrist ulnar deviation    Wrist radial deviation    Wrist pronation    Wrist supination     (Blank rows = not tested)  UPPER EXTREMITY MMT:  MMT Right eval Left eval  Shoulder flexion    Shoulder extension    Shoulder abduction    Shoulder adduction    Shoulder extension    Shoulder internal rotation    Shoulder external rotation    Middle trapezius    Lower trapezius    Elbow flexion    Elbow extension    Wrist flexion    Wrist extension    Wrist ulnar deviation    Wrist radial deviation    Wrist pronation    Wrist supination    Grip strength/pinch 62/11 58/11       (Blank rows = not tested)  CERVICAL SPECIAL TESTS:  Neck flexor muscle endurance test: Negative, Upper limb tension test (ULTT): Positive, and Spurling's test: Negative  FUNCTIONAL TESTS:  30 seconds chair stand test 14  TREATMENT DATE: [facility] Adult PT Treatment:                                                DATE: 11/05/23 Therapeutic Exercise: UBE L2 3/3 min R levator stretch 30s x2 R scalene stretch 30s x3 Manual Therapy: Skilled palpation to identify taught and  irritable bands in R paracervicals/multifidi Trigger Point Dry Needling  Subsequent Treatment: Instructions reviewed, if requested by the patient, prior to subsequent dry  needling treatment.   Patient Verbal Consent Given: Yes Education Handout Provided: Previously Provided Muscles Treated: R paracervicals/multifidi Electrical Stimulation Performed: No Treatment Response/Outcome: twitch response/pressure f/b symptom relief   Therapeutic Activity: Omega high row 25# 15x Omega low row 25# 15x Omega lat pulldown 25# 15x  [facility] Adult PT Treatment:                                                DATE: 10/08/23 Therapeutic Exercise: UBE L1 3/3 min Omega high row 20# Omega low row 20# Omega lat pulldown 20# Manual Therapy: Skilled palpation to identify taught and irritable bands in R UT and levator TrP release to R levator 2 min R scalene group stertch 30s x3 Trigger Point Dry Needling  Subsequent Treatment: Instructions provided previously at initial dry needling treatment.   Patient Verbal Consent Given: Yes Education Handout Provided: Previously Provided Muscles Treated: R UT Electrical Stimulation Performed: No Treatment Response/Outcome: twitch response f/b decreased tension in soft tissue     [facility] Adult PT Treatment:                                                DATE: 10/03/23 Therapeutic Exercise: UBE 3/3 min L1 Manual Therapy: Skilled palpation to identify taught and irritable bands in R UT Trigger Point Dry Needling  Initial Treatment: Pt instructed on Dry Needling rational, procedures, and possible side effects. Pt instructed to expect mild to moderate muscle soreness later in the day and/or into the next day.  Pt instructed in methods to reduce muscle soreness. Pt instructed to continue prescribed HEP. Patient was educated on signs and symptoms of infection and other risk factors and advised to seek medical attention should they occur.  Patient verbalized understanding of these instructions and education.   Patient Verbal Consent Given: Yes Education Handout Provided: Previously Provided Muscles Treated: R UT Electrical Stimulation  Performed: No Treatment Response/Outcome: twitch response followed by decreased tissue tension and discomfort    Therapeutic Activity: R Scalene stretch 30s x3 R UT stretch 30s x2 Omega high row 20# x15 Omega low row 20# x15 Omega lat pulldown 20# x15  [facility] Adult PT Treatment:                                                DATE: 09/21/23 Therapeutic Exercise: HEP review and update incorporating postural retraining  Supine hor abd RTB 15x Doorway stretch 30s x3 Manual Therapy: R first rib mob 5x10 R first rib MET into FF 5x5  Therapeutic Activity: FOTO retake/re-assess R scalene stretch seated 30s x3 R scalene stretch supine 30s x3    PATIENT EDUCATION:  Education details: Discussed eval findings, rehab rationale and POC and patient is in agreement  Person educated: Patient Education method: Explanation Education comprehension: verbalized understanding and needs further education  HOME EXERCISE PROGRAM: Access Code: [email or web address] URL: [email or web address] Date: 10/08/2023 Prepared by: Gretta Leavens  Exercises - Seated Cervical Sidebending Stretch  -  1 x daily - 5 x weekly - 1 sets - 2 reps - 30s hold - Standing Shoulder External Rotation with Resistance  - 1 x daily - 5 x weekly - 2 sets - 10 reps - Supine Shoulder Horizontal Abduction with Resistance  - 1 x daily - 5 x weekly - 2 sets - 15 reps - Doorway Pec Stretch at 90 Degrees Abduction  - 1 x daily - 5 x weekly - 1 sets - 3 reps - 30s hold - Seated Levator Scapulae Stretch on Wall  - 1 x daily - 5 x weekly - 1 sets - 3 reps - 30s hold  Patient Education - Office Posture  ASSESSMENT:  CLINICAL IMPRESSION: Much improved overall.  FOTO goal met.  Able to reach behind w/o triggering symptoms.  Able to tolerate an increased workload w/o symptom aggravation.  EVAL: Patient is a 33 y.o. female who was seen today for physical therapy evaluation and treatment for myofacial R UE symptoms brought due to  underlying soft tissue restrictions and TrPs in R UT and scalene group. R shoulder ROM WNL and no strength deficits identified.  Cervical ROM only slightly restricted in L SB with ULTT recreating patient symptoms.   OBJECTIVE IMPAIRMENTS: decreased knowledge of condition, decreased ROM, increased fascial restrictions, impaired perceived functional ability, impaired UE functional use, postural dysfunction, and pain.   ACTIVITY LIMITATIONS: carrying, lifting, sitting, and computer work  PERSONAL FACTORS: Age, Past/current experiences, and 1 comorbidity: positive ANA are also affecting patient's functional outcome.   REHAB POTENTIAL: Good  CLINICAL DECISION MAKING: Stable/uncomplicated  EVALUATION COMPLEXITY: Low   GOALS: Goals reviewed with patient? No  SHORT TERM GOALS: Target date: 08/20/2023  Patient to demonstrate independence in HEP  Baseline: [email or web address] Goal status: Met    LONG TERM GOALS: Target date: 09/10/2023  Patient will acknowledge 4/10 pain at least once during episode of care   Baseline: 10/10; 09/21/23 6/10 Goal status: Ongoing  2.  Patient will score at least 75% on FOTO to signify clinically meaningful improvement in functional abilities.   Baseline: 61%; 09/21/23 68%; 11/05/23 83% Goal status: Met  3.  Increase L SB to 90% Baseline:  Active ROM A/PROM (deg) eval 09/21/23  Flexion 90% 100%  Extension 90% 100%  Right lateral flexion 90% 100%  Left lateral flexion 75% 100%  Right rotation 90% 100%  Left rotation 90% 100%    Goal status: Met  4.  Minimal irritation with RUE ULTT Baseline: Moderate irritation Goal status: INITIAL     PLAN:  PT FREQUENCY: 1x/week  PT DURATION: 4 weeks  PLANNED INTERVENTIONS: 97164- PT Re-evaluation, 97110-Therapeutic exercises, 97530- Therapeutic activity, 97112- Neuromuscular re-education, 97535- Self Care, 54098- Manual therapy, Dry Needling, Joint mobilization, and Spinal mobilization  PLAN FOR NEXT SESSION: HEP  review and update, manual techniques as appropriate, aerobic tasks, ROM and flexibility activities, strengthening and PREs, TPDN, gait and balance training as needed     Eldon Greenland, PT 11/09/2023, 12:06 PM

## 2023-11-12 ENCOUNTER — Ambulatory Visit

## 2023-11-12 ENCOUNTER — Encounter (HOSPITAL_COMMUNITY): Payer: Self-pay

## 2023-11-12 ENCOUNTER — Other Ambulatory Visit: Payer: Self-pay | Admitting: Nurse Practitioner

## 2023-11-12 DIAGNOSIS — M5412 Radiculopathy, cervical region: Secondary | ICD-10-CM | POA: Diagnosis not present

## 2023-11-12 DIAGNOSIS — G8929 Other chronic pain: Secondary | ICD-10-CM

## 2023-11-12 DIAGNOSIS — M6281 Muscle weakness (generalized): Secondary | ICD-10-CM

## 2023-11-12 NOTE — Telephone Encounter (Addendum)
 Requesting: NIFEdipine  ER Osmotic Release 30 MG Oral Tablet Extended Release 24 Hour  Last Visit: 09/04/2023 Next Visit: 11/19/2023 Last Refill: 08/15/2023  Please Advise    Pharmacy requested: 90 tablet

## 2023-11-19 ENCOUNTER — Encounter: Payer: Self-pay | Admitting: Nurse Practitioner

## 2023-11-19 ENCOUNTER — Ambulatory Visit: Admitting: Nurse Practitioner

## 2023-11-19 VITALS — BP 120/80 | HR 72 | Temp 97.1°F | Ht 66.0 in | Wt 250.8 lb

## 2023-11-19 DIAGNOSIS — G4733 Obstructive sleep apnea (adult) (pediatric): Secondary | ICD-10-CM | POA: Diagnosis not present

## 2023-11-19 MED ORDER — ZEPBOUND 5 MG/0.5ML ~~LOC~~ SOAJ
5.0000 mg | SUBCUTANEOUS | 2 refills | Status: DC
Start: 2023-11-19 — End: 2024-02-05

## 2023-11-19 NOTE — Assessment & Plan Note (Signed)
 Mild sleep apnea is identified, particularly during REM sleep. Excessive daytime sleepiness may not be fully explained by apnea per neurology. She may end up having MSLT test for narcolepsy. Keep upcoming appointment with sleep medicine.

## 2023-11-19 NOTE — Progress Notes (Signed)
 Established Patient Office Visit  Subjective   Patient ID: Leslie Arroyo, female    DOB: 03-21-1991  Age: 33 y.o. MRN: [id number]  Chief Complaint  Patient presents with   Medication Refill   Follow-up    Also was wondering about being tested for narcolepsy    HPI Discussed the use of AI scribe software for clinical note transcription with the patient, who gave verbal consent to proceed.  History of Present Illness   Leslie Arroyo is a 33 year old female who presents for follow-up on weight management and sleep study results.  She has been taking Zepbound  for two months, resulting in a weight loss of ten pounds. She experiences reduced appetite and altered taste preferences, with fewer cravings for greasy foods. She denies nausea or constipation.  A recent sleep study shows mild sleep apnea with 19 apnea episodes, especially during REM sleep. Despite CPAP use, she experiences excessive daytime sleepiness, vivid dreams, and hypnopompic hallucinations. She often falls asleep in various situations and has episodes of physical activity while mentally asleep. She has reduced caffeine intake since starting Zepbound . She is interested in being tested for narcolepsy.       ROS See pertinent positives and negatives per HPI.    Objective:     BP 120/80 (BP Location: Left Arm, Patient Position: Sitting, Cuff Size: Normal)   Pulse 72   Temp (!) 97.1 F (36.2 C) (Temporal)   Ht 5\' 6"  (1.676 m)   Wt 250 lb 12.8 oz (113.8 kg)   SpO2 98%   BMI 40.48 kg/m  BP Readings from Last 3 Encounters:  11/19/23 120/80  09/25/23 138/89  09/04/23 (!) 128/90   Wt Readings from Last 3 Encounters:  11/19/23 250 lb 12.8 oz (113.8 kg)  09/25/23 257 lb (116.6 kg)  09/04/23 260 lb 9.6 oz (118.2 kg)      Physical Exam Vitals and nursing note reviewed.  Constitutional:      General: She is not in acute distress.    Appearance: Normal appearance.  HENT:     Head: Normocephalic.  Eyes:      Conjunctiva/sclera: Conjunctivae normal.  Cardiovascular:     Rate and Rhythm: Normal rate and regular rhythm.     Pulses: Normal pulses.     Heart sounds: Normal heart sounds.  Pulmonary:     Effort: Pulmonary effort is normal.     Breath sounds: Normal breath sounds.  Musculoskeletal:     Cervical back: Normal range of motion.  Skin:    General: Skin is warm.  Neurological:     General: No focal deficit present.     Mental Status: She is alert and oriented to person, place, and time.  Psychiatric:        Mood and Affect: Mood normal.        Behavior: Behavior normal.        Thought Content: Thought content normal.        Judgment: Judgment normal.      Assessment & Plan:   Problem List Items Addressed This Visit       Respiratory   OSA (obstructive sleep apnea) - Primary   Mild sleep apnea is identified, particularly during REM sleep. Excessive daytime sleepiness may not be fully explained by apnea per neurology. She may end up having MSLT test for narcolepsy. Keep upcoming appointment with sleep medicine.         Other   Morbid obesity (HCC)   BMI 40.4. She has  lost ten pounds with Zepbound , experiencing reduced appetite and cravings without adverse effects. The current dose of 2.5 mg is well tolerated. Increase Zepbound  to 5 mg. Advise her to contact if cravings return or dose adjustment is needed. Follow-up in 3 months.       Relevant Medications   tirzepatide  (ZEPBOUND ) 5 MG/0.5ML Pen   Return in about 3 months (around 02/19/2024) for CPE.    Odette Benjamin, NP

## 2023-11-19 NOTE — Patient Instructions (Signed)
 It was great to see you!  Let's increase your zepbound  to 5mg  weekly   Let's follow-up in 3 months, sooner if you have concerns.  If a referral was placed today, you will be contacted for an appointment. Please note that routine referrals can sometimes take up to 3-4 weeks to process. Please call our office if you haven't heard anything after this time frame.  Take care,  Rheba Cedar, NP

## 2023-11-19 NOTE — Assessment & Plan Note (Signed)
 BMI 40.4. She has lost ten pounds with Zepbound , experiencing reduced appetite and cravings without adverse effects. The current dose of 2.5 mg is well tolerated. Increase Zepbound  to 5 mg. Advise her to contact if cravings return or dose adjustment is needed. Follow-up in 3 months.

## 2023-11-26 ENCOUNTER — Other Ambulatory Visit: Payer: Self-pay | Admitting: Nurse Practitioner

## 2023-11-27 NOTE — Therapy (Unsigned)
 OUTPATIENT PHYSICAL THERAPY TREATMENT NOTE   Patient Name: Leslie Arroyo MRN: [id number] DOB:[birth year], 33 y.o., female Today's Date: 11/28/2023  END OF SESSION:  PT End of Session - 11/28/23 1735     Visit Number 11    Number of Visits 12    Date for PT Re-Evaluation 11/21/23    Authorization Type [employer]    Authorization Time Period VL 20    Authorization - Number of Visits 20    PT Start Time 1735    PT Stop Time 1825    PT Time Calculation (min) 50 min    Activity Tolerance Patient tolerated treatment well    Behavior During Therapy WFL for tasks assessed/performed            Past Medical History:  Diagnosis Date   Kidney stones    Prediabetes    Past Surgical History:  Procedure Laterality Date   EXTRACORPOREAL SHOCK WAVE LITHOTRIPSY Left 09/16/2018   Procedure: EXTRACORPOREAL SHOCK WAVE LITHOTRIPSY (ESWL);  Surgeon: Adelbert Homans, MD;  Location: [facility];  Service: Urology;  Laterality: Left;   HARDWARE REMOVAL Right    leg   KNEE ARTHROSCOPY Left    LEG SURGERY Right    WISDOM TOOTH EXTRACTION     Patient Active Problem List   Diagnosis Date Noted   OSA (obstructive sleep apnea) 11/19/2023   Hypnopompic hallucination 10/30/2023   Excessive daytime sleepiness 09/25/2023   Vivid dream 09/25/2023   Snoring 09/25/2023   Hypersomnia with sleep apnea 09/25/2023   Positive ANA (antinuclear antibody) 02/08/2023   Rash and other nonspecific skin eruption 01/03/2023   Urticaria 01/03/2023   Other allergic rhinitis 01/03/2023   Elevated blood pressure reading 01/03/2023   Sore throat 11/20/2022   Gastroesophageal reflux disease 07/10/2022   Primary hypertension 05/03/2022   Morbid obesity (HCC) 05/03/2022   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   Prediabetes 05/30/2021   Family history of kidney stones 10/24/2017   History of kidney stones 08/08/2017    PCP: Odette Benjamin, NP   REFERRING PROVIDER: Gavin Kast,  FNP  REFERRING DIAG: [phone] (ICD-10-CM) - Cervical radiculopathy  THERAPY DIAG:  Radiculopathy, cervical region  Muscle weakness (generalized)  Chronic right shoulder pain  Rationale for Evaluation and Treatment: Rehabilitation  ONSET DATE: 06/2023  SUBJECTIVE:                                                                                                                                                                                                         SUBJECTIVE STATEMENT: Continued R SO and  paraspinal symptoms, worse with lifting and strenuous UE tasks.  Has a massager at home but has been reluctant to use it  EVAL: Describes a history or R sided arm pain brought on by prolonged positioning and computer work.  Symptoms described as diffuse N&T as well as paresthesias not following any distinct dermatome.  Symptoms resolved with position changes. Hand dominance: Right  PERTINENT HISTORY:  HPI: Discussed the use of AI scribe software for clinical note transcription with the patient, who gave verbal consent to proceed.   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 They describe the pain as a dull, tugging sensation that radiates from the back of their neck down to their right arm, occasionally causing numbness. The pain is exacerbated by prolonged computer use and is only partially relieved by dual-action Aleve. The patient has a history of a dislocated right shoulder blade in high school and scoliosis, and has been working extensively on the computer for the past few years. They also report occasional right-sided weakness, particularly noticeable when holding objects like a coffee cup. The patient has tried ibuprofen  for pain relief, but it has been ineffective.  PAIN:  Are you having pain? Yes: NPRS scale: 10/10 Pain location: R shoulder/UT Pain description: ache Aggravating  factors: computer work Relieving factors: rest, medication  PRECAUTIONS: None  RED FLAGS: None     WEIGHT BEARING RESTRICTIONS: No  FALLS:  Has patient fallen in last 6 months? No  OCCUPATION: Risk analyst  PLOF: Independent  PATIENT GOALS: To mange my symptoms  NEXT MD VISIT: TBD  OBJECTIVE:  Note: Objective measures were completed at Evaluation unless otherwise noted.  DIAGNOSTIC FINDINGS:  none  PATIENT SURVEYS:  FOTO 61(75 predicted) 09/21/23 68 11/05/23 83%  POSTURE: rounded shoulders and forward head  PALPATION: TTP R UT and Scalenes  09/21/23 TTP R UT, R scalene group and infraspinatus  CERVICAL ROM:   Active ROM A/PROM (deg) eval 09/21/23  Flexion 90% 100%  Extension 90% 100%  Right lateral flexion 90% 100%  Left lateral flexion 75% 100%  Right rotation 90% 100%  Left rotation 90% 100%   (Blank rows = not tested)  UPPER EXTREMITY ROM: WNL throughout  Active ROM Right eval Left eval  Shoulder flexion    Shoulder extension    Shoulder abduction    Shoulder adduction    Shoulder extension    Shoulder internal rotation    Shoulder external rotation    Elbow flexion    Elbow extension    Wrist flexion    Wrist extension    Wrist ulnar deviation    Wrist radial deviation    Wrist pronation    Wrist supination     (Blank rows = not tested)  UPPER EXTREMITY MMT:  MMT Right eval Left eval  Shoulder flexion    Shoulder extension    Shoulder abduction    Shoulder adduction    Shoulder extension    Shoulder internal rotation    Shoulder external rotation    Middle trapezius    Lower trapezius    Elbow flexion    Elbow extension    Wrist flexion    Wrist extension    Wrist ulnar deviation    Wrist radial deviation    Wrist pronation    Wrist supination    Grip strength/pinch 62/11 58/11       (Blank rows = not tested)  CERVICAL SPECIAL TESTS:  Neck flexor muscle endurance test: Negative, Upper limb tension test (ULTT):  Positive, and Spurling's test: Negative  FUNCTIONAL TESTS:  30 seconds chair stand test 14  TREATMENT DATE: [facility] Adult PT Treatment:                                                DATE: 11/28/23 Therapeutic Exercise: UBE L2 3/3 min Manual Therapy: Skilled palpation to identify taught and irritable bands in R paracervical/splenius cervicis B OA mobs to assess for dysfunction Trigger Point Dry Needling  Subsequent Treatment: Instructions provided previously at initial dry needling treatment.   Patient Verbal Consent Given: Yes Education Handout Provided: Previously Provided Muscles Treated: R paracervical/splenius cervicis Electrical Stimulation Performed: No Treatment Response/Outcome: twitch response and less disscomfort    Neuromuscular re-ed: Prone Ys, Ts, Ws, Is and shoulder extension 15x  [facility] Adult PT Treatment:                                                DATE: 11/12/23 Therapeutic Exercise: UBE L3 3/3 min R levator stretch 30s x2 R scalene stretch 30s x3 Manual Therapy: Skilled palpation to identify taught and irritable bands in R SO Trigger Point Dry Needling  Subsequent Treatment: Instructions provided previously at initial dry needling treatment.   Patient Verbal Consent Given: Yes Education Handout Provided: Previously Provided Muscles Treated: R SOs Electrical Stimulation Performed: No Treatment Response/Outcome: decreased pressure/symptoms post technique    Therapeutic Activity: Omega high row 30# 15x Omega low row 30# 15x Omega lat pulldown 30# 15x  [facility] Adult PT Treatment:                                                DATE: 11/05/23 Therapeutic Exercise: UBE L2 3/3 min R levator stretch 30s x2 R scalene stretch 30s x3 Manual Therapy: Skilled palpation to identify taught and irritable bands in R paracervicals/multifidi Trigger Point Dry Needling  Subsequent Treatment: Instructions reviewed, if requested by the patient, prior to subsequent dry  needling treatment.   Patient Verbal Consent Given: Yes Education Handout Provided: Previously Provided Muscles Treated: R paracervicals/multifidi Electrical Stimulation Performed: No Treatment Response/Outcome: twitch response/pressure f/b symptom relief   Therapeutic Activity: Omega high row 25# 15x Omega low row 25# 15x Omega lat pulldown 25# 15x  [facility] Adult PT Treatment:                                                DATE: 10/08/23 Therapeutic Exercise: UBE L1 3/3 min Omega high row 20# Omega low row 20# Omega lat pulldown 20# Manual Therapy: Skilled palpation to identify taught and irritable bands in R UT and levator TrP release to R levator 2 min R scalene group stertch 30s x3 Trigger Point Dry Needling  Subsequent Treatment: Instructions provided previously at initial dry needling treatment.   Patient Verbal Consent Given: Yes Education Handout Provided: Previously Provided Muscles Treated: R UT Electrical Stimulation Performed: No  Treatment Response/Outcome: twitch response f/b decreased tension in soft tissue        PATIENT EDUCATION:  Education details: Discussed eval findings, rehab rationale and POC and patient is in agreement  Person educated: Patient Education method: Explanation Education comprehension: verbalized understanding and needs further education  HOME EXERCISE PROGRAM: Access Code: [email or web address] URL: [email or web address] Date: 10/08/2023 Prepared by: Gretta Leavens  Exercises - Seated Cervical Sidebending Stretch  - 1 x daily - 5 x weekly - 1 sets - 2 reps - 30s hold - Standing Shoulder External Rotation with Resistance  - 1 x daily - 5 x weekly - 2 sets - 10 reps - Supine Shoulder Horizontal Abduction with Resistance  - 1 x daily - 5 x weekly - 2 sets - 15 reps - Doorway Pec Stretch at 90 Degrees Abduction  - 1 x daily - 5 x weekly - 1 sets - 3 reps - 30s hold - Seated Levator Scapulae Stretch on Wall  - 1 x daily - 5 x weekly -  1 sets - 3 reps - 30s hold  Patient Education - Office Posture  ASSESSMENT:  CLINICAL IMPRESSION: Has regained full cervical ROM w/o aggravating symptoms.  Discussed use of home massager, massage therapy and dry needle clinics.  Patient has one scheduled PT session and will explore options and need to keep appointment based on symptoms and HEP update.  Little to no pain at end of session.  EVAL: Patient is a 33 y.o. female who was seen today for physical therapy evaluation and treatment for myofacial R UE symptoms brought due to underlying soft tissue restrictions and TrPs in R UT and scalene group. R shoulder ROM WNL and no strength deficits identified.  Cervical ROM only slightly restricted in L SB with ULTT recreating patient symptoms.   OBJECTIVE IMPAIRMENTS: decreased knowledge of condition, decreased ROM, increased fascial restrictions, impaired perceived functional ability, impaired UE functional use, postural dysfunction, and pain.   ACTIVITY LIMITATIONS: carrying, lifting, sitting, and computer work  PERSONAL FACTORS: Age, Past/current experiences, and 1 comorbidity: positive ANA are also affecting patient's functional outcome.   REHAB POTENTIAL: Good  CLINICAL DECISION MAKING: Stable/uncomplicated  EVALUATION COMPLEXITY: Low   GOALS: Goals reviewed with patient? No  SHORT TERM GOALS: Target date: 08/20/2023  Patient to demonstrate independence in HEP  Baseline: [id number] Goal status: Met    LONG TERM GOALS: Target date: 09/10/2023  Patient will acknowledge 4/10 pain at least once during episode of care   Baseline: 10/10; 09/21/23 6/10 Goal status: Ongoing  2.  Patient will score at least 75% on FOTO to signify clinically meaningful improvement in functional abilities.   Baseline: 61%; 09/21/23 68%; 11/05/23 83% Goal status: Met  3.  Increase L SB to 90% Baseline:  Active ROM A/PROM (deg) eval 09/21/23  Flexion 90% 100%  Extension 90% 100%  Right lateral flexion  90% 100%  Left lateral flexion 75% 100%  Right rotation 90% 100%  Left rotation 90% 100%    Goal status: Met  4.  Minimal irritation with RUE ULTT Baseline: Moderate irritation Goal status: INITIAL     PLAN:  PT FREQUENCY: 1x/week  PT DURATION: 4 weeks  PLANNED INTERVENTIONS: 97164- PT Re-evaluation, 97110-Therapeutic exercises, 97530- Therapeutic activity, 97112- Neuromuscular re-education, 97535- Self Care, 14782- Manual therapy, Dry Needling, Joint mobilization, and Spinal mobilization  PLAN FOR NEXT SESSION: HEP review and update, manual techniques as appropriate, aerobic tasks, ROM and flexibility activities, strengthening and PREs, TPDN, gait and balance training as needed  Lillyona Polasek M Shirlette Scarber, PT 11/28/2023, 6:28 PM

## 2023-11-28 ENCOUNTER — Ambulatory Visit

## 2023-11-28 ENCOUNTER — Encounter: Payer: Self-pay | Admitting: Neurology

## 2023-11-28 DIAGNOSIS — M5412 Radiculopathy, cervical region: Secondary | ICD-10-CM | POA: Diagnosis not present

## 2023-11-28 DIAGNOSIS — M6281 Muscle weakness (generalized): Secondary | ICD-10-CM

## 2023-11-28 DIAGNOSIS — G8929 Other chronic pain: Secondary | ICD-10-CM

## 2023-12-03 NOTE — Therapy (Deleted)
 OUTPATIENT PHYSICAL THERAPY TREATMENT NOTE   Patient Name: Leslie Arroyo MRN: [id number] DOB:April 07, 1991, 33 y.o., female Today's Date: 12/03/2023  END OF SESSION:   Past Medical History:  Diagnosis Date   Kidney stones    Prediabetes    Past Surgical History:  Procedure Laterality Date   EXTRACORPOREAL SHOCK WAVE LITHOTRIPSY Left 09/16/2018   Procedure: EXTRACORPOREAL SHOCK WAVE LITHOTRIPSY (ESWL);  Surgeon: Adelbert Homans, MD;  Location: [facility];  Service: Urology;  Laterality: Left;   HARDWARE REMOVAL Right    leg   KNEE ARTHROSCOPY Left    LEG SURGERY Right    WISDOM TOOTH EXTRACTION     Patient Active Problem List   Diagnosis Date Noted   OSA (obstructive sleep apnea) 11/19/2023   Hypnopompic hallucination 10/30/2023   Excessive daytime sleepiness 09/25/2023   Vivid dream 09/25/2023   Snoring 09/25/2023   Hypersomnia with sleep apnea 09/25/2023   Positive ANA (antinuclear antibody) 02/08/2023   Rash and other nonspecific skin eruption 01/03/2023   Urticaria 01/03/2023   Other allergic rhinitis 01/03/2023   Elevated blood pressure reading 01/03/2023   Sore throat 11/20/2022   Gastroesophageal reflux disease 07/10/2022   Primary hypertension 05/03/2022   Morbid obesity (HCC) 05/03/2022   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   Prediabetes 05/30/2021   Family history of kidney stones 10/24/2017   History of kidney stones 08/08/2017    PCP: Odette Benjamin, NP   REFERRING PROVIDER: Gavin Kast, FNP  REFERRING DIAG: M54.12 (ICD-10-CM) - Cervical radiculopathy  THERAPY DIAG:  No diagnosis found.  Rationale for Evaluation and Treatment: Rehabilitation  ONSET DATE: 06/2023  SUBJECTIVE:                                                                                                                                                                                                         SUBJECTIVE STATEMENT: Continued R SO and  paraspinal symptoms, worse with lifting and strenuous UE tasks.  Has a massager at home but has been reluctant to use it  EVAL: Describes a history or R sided arm pain brought on by prolonged positioning and computer work.  Symptoms described as diffuse N&T as well as paresthesias not following any distinct dermatome.  Symptoms resolved with position changes. Hand dominance: Right  PERTINENT HISTORY:  HPI: Discussed the use of AI scribe software for clinical note transcription with the patient, who gave verbal consent to proceed.   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 They describe the pain as a dull, tugging sensation that radiates from the back of their neck down to their right arm, occasionally causing numbness. The pain is exacerbated by prolonged computer use and is only partially relieved by dual-action Aleve. The patient has a history of a dislocated right shoulder blade in high school and scoliosis, and has been working extensively on the computer for the past few years. They also report occasional right-sided weakness, particularly noticeable when holding objects like a coffee cup. The patient has tried ibuprofen  for pain relief, but it has been ineffective.  PAIN:  Are you having pain? Yes: NPRS scale: 10/10 Pain location: R shoulder/UT Pain description: ache Aggravating factors: computer work Relieving factors: rest, medication  PRECAUTIONS: None  RED FLAGS: None     WEIGHT BEARING RESTRICTIONS: No  FALLS:  Has patient fallen in last 6 months? No  OCCUPATION: Risk analyst  PLOF: Independent  PATIENT GOALS: To mange my symptoms  NEXT MD VISIT: TBD  OBJECTIVE:  Note: Objective measures were completed at Evaluation unless otherwise noted.  DIAGNOSTIC FINDINGS:  none  PATIENT SURVEYS:  FOTO 61(75 predicted) 09/21/23 68 11/05/23 83%  POSTURE: rounded  shoulders and forward head  PALPATION: TTP R UT and Scalenes  09/21/23 TTP R UT, R scalene group and infraspinatus  CERVICAL ROM:   Active ROM A/PROM (deg) eval 09/21/23  Flexion 90% 100%  Extension 90% 100%  Right lateral flexion 90% 100%  Left lateral flexion 75% 100%  Right rotation 90% 100%  Left rotation 90% 100%   (Blank rows = not tested)  UPPER EXTREMITY ROM: WNL throughout  Active ROM Right eval Left eval  Shoulder flexion    Shoulder extension    Shoulder abduction    Shoulder adduction    Shoulder extension    Shoulder internal rotation    Shoulder external rotation    Elbow flexion    Elbow extension    Wrist flexion    Wrist extension    Wrist ulnar deviation    Wrist radial deviation    Wrist pronation    Wrist supination     (Blank rows = not tested)  UPPER EXTREMITY MMT:  MMT Right eval Left eval  Shoulder flexion    Shoulder extension    Shoulder abduction    Shoulder adduction    Shoulder extension    Shoulder internal rotation    Shoulder external rotation    Middle trapezius    Lower trapezius    Elbow flexion    Elbow extension    Wrist flexion    Wrist extension    Wrist ulnar deviation    Wrist radial deviation    Wrist pronation    Wrist supination    Grip strength/pinch 62/11 58/11       (Blank rows = not tested)  CERVICAL SPECIAL TESTS:  Neck flexor muscle endurance test: Negative, Upper limb tension test (ULTT): Positive, and Spurling's test: Negative  FUNCTIONAL TESTS:  30 seconds chair stand test 14  TREATMENT DATE: [facility] Adult PT Treatment:                                                DATE: 11/28/23 Therapeutic Exercise: UBE L2 3/3 min Manual Therapy: Skilled palpation to identify taught and irritable bands in R paracervical/splenius cervicis B OA mobs to assess for dysfunction  Trigger Point Dry Needling  Subsequent Treatment: Instructions provided previously at initial dry needling treatment.   Patient  Verbal Consent Given: Yes Education Handout Provided: Previously Provided Muscles Treated: R paracervical/splenius cervicis Electrical Stimulation Performed: No Treatment Response/Outcome: twitch response and less disscomfort    Neuromuscular re-ed: Prone Ys, Ts, Ws, Is and shoulder extension 15x  [facility] Adult PT Treatment:                                                DATE: 11/12/23 Therapeutic Exercise: UBE L3 3/3 min R levator stretch 30s x2 R scalene stretch 30s x3 Manual Therapy: Skilled palpation to identify taught and irritable bands in R SO Trigger Point Dry Needling  Subsequent Treatment: Instructions provided previously at initial dry needling treatment.   Patient Verbal Consent Given: Yes Education Handout Provided: Previously Provided Muscles Treated: R SOs Electrical Stimulation Performed: No Treatment Response/Outcome: decreased pressure/symptoms post technique    Therapeutic Activity: Omega high row 30# 15x Omega low row 30# 15x Omega lat pulldown 30# 15x  [facility] Adult PT Treatment:                                                DATE: 11/05/23 Therapeutic Exercise: UBE L2 3/3 min R levator stretch 30s x2 R scalene stretch 30s x3 Manual Therapy: Skilled palpation to identify taught and irritable bands in R paracervicals/multifidi Trigger Point Dry Needling  Subsequent Treatment: Instructions reviewed, if requested by the patient, prior to subsequent dry needling treatment.   Patient Verbal Consent Given: Yes Education Handout Provided: Previously Provided Muscles Treated: R paracervicals/multifidi Electrical Stimulation Performed: No Treatment Response/Outcome: twitch response/pressure f/b symptom relief   Therapeutic Activity: Omega high row 25# 15x Omega low row 25# 15x Omega lat pulldown 25# 15x  [facility] Adult PT Treatment:                                                DATE: 10/08/23 Therapeutic Exercise: UBE L1 3/3 min Omega high row 20# Omega low row  20# Omega lat pulldown 20# Manual Therapy: Skilled palpation to identify taught and irritable bands in R UT and levator TrP release to R levator 2 min R scalene group stertch 30s x3 Trigger Point Dry Needling  Subsequent Treatment: Instructions provided previously at initial dry needling treatment.   Patient Verbal Consent Given: Yes Education Handout Provided: Previously Provided Muscles Treated: R UT Electrical Stimulation Performed: No Treatment Response/Outcome: twitch response f/b decreased tension in soft tissue        PATIENT EDUCATION:  Education details: Discussed eval findings, rehab rationale and POC and patient is in agreement  Person educated: Patient Education method: Explanation Education comprehension: verbalized understanding and needs further education  HOME EXERCISE PROGRAM: Access Code: [email or web address] URL: [email or web address] Date: 10/08/2023 Prepared by: Alorah Mcree  Exercises - Seated Cervical Sidebending Stretch  - 1 x daily - 5 x weekly - 1 sets - 2 reps - 30s hold - Standing Shoulder External Rotation with Resistance  - 1 x daily - 5 x weekly - 2 sets - 10 reps - Supine  Shoulder Horizontal Abduction with Resistance  - 1 x daily - 5 x weekly - 2 sets - 15 reps - Doorway Pec Stretch at 90 Degrees Abduction  - 1 x daily - 5 x weekly - 1 sets - 3 reps - 30s hold - Seated Levator Scapulae Stretch on Wall  - 1 x daily - 5 x weekly - 1 sets - 3 reps - 30s hold  Patient Education - Office Posture  ASSESSMENT:  CLINICAL IMPRESSION: Has regained full cervical ROM w/o aggravating symptoms.  Discussed use of home massager, massage therapy and dry needle clinics.  Patient has one scheduled PT session and will explore options and need to keep appointment based on symptoms and HEP update.  Little to no pain at end of session.  EVAL: Patient is a 34 y.o. female who was seen today for physical therapy evaluation and treatment for myofacial R UE  symptoms brought due to underlying soft tissue restrictions and TrPs in R UT and scalene group. R shoulder ROM WNL and no strength deficits identified.  Cervical ROM only slightly restricted in L SB with ULTT recreating patient symptoms.   OBJECTIVE IMPAIRMENTS: decreased knowledge of condition, decreased ROM, increased fascial restrictions, impaired perceived functional ability, impaired UE functional use, postural dysfunction, and pain.   ACTIVITY LIMITATIONS: carrying, lifting, sitting, and computer work  PERSONAL FACTORS: Age, Past/current experiences, and 1 comorbidity: positive ANA are also affecting patient's functional outcome.   REHAB POTENTIAL: Good  CLINICAL DECISION MAKING: Stable/uncomplicated  EVALUATION COMPLEXITY: Low   GOALS: Goals reviewed with patient? No  SHORT TERM GOALS: Target date: 08/20/2023  Patient to demonstrate independence in HEP  Baseline: [id number] Goal status: Met    LONG TERM GOALS: Target date: 09/10/2023  Patient will acknowledge 4/10 pain at least once during episode of care   Baseline: 10/10; 09/21/23 6/10 Goal status: Ongoing  2.  Patient will score at least 75% on FOTO to signify clinically meaningful improvement in functional abilities.   Baseline: 61%; 09/21/23 68%; 11/05/23 83% Goal status: Met  3.  Increase L SB to 90% Baseline:  Active ROM A/PROM (deg) eval 09/21/23  Flexion 90% 100%  Extension 90% 100%  Right lateral flexion 90% 100%  Left lateral flexion 75% 100%  Right rotation 90% 100%  Left rotation 90% 100%    Goal status: Met  4.  Minimal irritation with RUE ULTT Baseline: Moderate irritation Goal status: INITIAL     PLAN:  PT FREQUENCY: 1x/week  PT DURATION: 4 weeks  PLANNED INTERVENTIONS: 97164- PT Re-evaluation, 97110-Therapeutic exercises, 97530- Therapeutic activity, 97112- Neuromuscular re-education, 97535- Self Care, 54098- Manual therapy, Dry Needling, Joint mobilization, and Spinal mobilization  PLAN  FOR NEXT SESSION: HEP review and update, manual techniques as appropriate, aerobic tasks, ROM and flexibility activities, strengthening and PREs, TPDN, gait and balance training as needed     Eldon Greenland, PT 12/03/2023, 2:33 PM

## 2023-12-04 ENCOUNTER — Ambulatory Visit: Attending: Nurse Practitioner

## 2023-12-05 ENCOUNTER — Encounter

## 2024-01-01 ENCOUNTER — Encounter: Payer: Self-pay | Admitting: Neurology

## 2024-02-05 ENCOUNTER — Other Ambulatory Visit: Payer: Self-pay | Admitting: Nurse Practitioner

## 2024-02-05 NOTE — Telephone Encounter (Signed)
 Requesting: Zepbound  5 MG/0.5ML Subcutaneous Solution Auto-injector  Last Visit: 11/19/2023 Next Visit: 02/20/2024 Last Refill: 11/19/2023  Please Advise

## 2024-02-08 ENCOUNTER — Telehealth: Payer: Self-pay | Admitting: Pharmacy Technician

## 2024-02-08 ENCOUNTER — Other Ambulatory Visit (HOSPITAL_COMMUNITY): Payer: Self-pay

## 2024-02-08 NOTE — Telephone Encounter (Signed)
 Pharmacy Patient Advocate Encounter   Received notification from CoverMyMeds that prior authorization for Zepbound  2.5MG /0.5ML pen-injectors is due for renewal.   Insurance verification completed.   The patient is insured through [facility].  Good morning her PA for Zepbound  will expire 03/08/24, so it's very important that she has a current weight check (within 45 days) before then.

## 2024-02-08 NOTE — Telephone Encounter (Signed)
 Pharmacy Patient Advocate Encounter   Received notification from CoverMyMeds that prior authorization for Zepbound  2.5MG /0.5ML pen-injectors is due for renewal.   Insurance verification completed.   The patient is insured through [facility].  Action: Medication has been discontinued. Archived Key: [id number]  **She is taking 5mg  now**

## 2024-02-14 ENCOUNTER — Other Ambulatory Visit: Payer: Self-pay | Admitting: Nurse Practitioner

## 2024-02-14 NOTE — Telephone Encounter (Signed)
 Requesting: Labetalol  HCl 200 MG Oral Tablet , NIFEDIPINE  ER 30MG  TAB  Last Visit: 11/19/2023 Next Visit: 02/20/2024 Last Refill: 06/22/2023, 11/12/2023  Please Advise

## 2024-02-20 ENCOUNTER — Ambulatory Visit: Payer: Self-pay | Admitting: Nurse Practitioner

## 2024-02-20 ENCOUNTER — Ambulatory Visit: Admitting: Nurse Practitioner

## 2024-02-20 ENCOUNTER — Encounter: Payer: Self-pay | Admitting: Nurse Practitioner

## 2024-02-20 VITALS — BP 120/80 | HR 73 | Temp 98.0°F | Ht 66.0 in | Wt 238.8 lb

## 2024-02-20 DIAGNOSIS — Z0000 Encounter for general adult medical examination without abnormal findings: Secondary | ICD-10-CM | POA: Diagnosis not present

## 2024-02-20 DIAGNOSIS — I10 Essential (primary) hypertension: Secondary | ICD-10-CM | POA: Diagnosis not present

## 2024-02-20 DIAGNOSIS — Z6838 Body mass index (BMI) 38.0-38.9, adult: Secondary | ICD-10-CM

## 2024-02-20 DIAGNOSIS — R7303 Prediabetes: Secondary | ICD-10-CM

## 2024-02-20 DIAGNOSIS — Z23 Encounter for immunization: Secondary | ICD-10-CM

## 2024-02-20 DIAGNOSIS — G4733 Obstructive sleep apnea (adult) (pediatric): Secondary | ICD-10-CM | POA: Diagnosis not present

## 2024-02-20 DIAGNOSIS — R682 Dry mouth, unspecified: Secondary | ICD-10-CM | POA: Insufficient documentation

## 2024-02-20 LAB — CBC WITH DIFFERENTIAL/PLATELET
Basophils Absolute: 0 K/uL (ref 0.0–0.1)
Basophils Relative: 0.5 % (ref 0.0–3.0)
Eosinophils Absolute: 0.2 K/uL (ref 0.0–0.7)
Eosinophils Relative: 2.1 % (ref 0.0–5.0)
HCT: 37.5 % (ref 36.0–46.0)
Hemoglobin: 11.8 g/dL — ABNORMAL LOW (ref 12.0–15.0)
Lymphocytes Relative: 23.3 % (ref 12.0–46.0)
Lymphs Abs: 2.1 K/uL (ref 0.7–4.0)
MCHC: 31.6 g/dL (ref 30.0–36.0)
MCV: 71.8 fl — ABNORMAL LOW (ref 78.0–100.0)
Monocytes Absolute: 0.5 K/uL (ref 0.1–1.0)
Monocytes Relative: 5.5 % (ref 3.0–12.0)
Neutro Abs: 6.2 K/uL (ref 1.4–7.7)
Neutrophils Relative %: 68.6 % (ref 43.0–77.0)
Platelets: 283 K/uL (ref 150.0–400.0)
RBC: 5.22 Mil/uL — ABNORMAL HIGH (ref 3.87–5.11)
RDW: 17.4 % — ABNORMAL HIGH (ref 11.5–15.5)
WBC: 9 K/uL (ref 4.0–10.5)

## 2024-02-20 LAB — COMPREHENSIVE METABOLIC PANEL WITH GFR
ALT: 11 U/L (ref 0–35)
AST: 13 U/L (ref 0–37)
Albumin: 4.3 g/dL (ref 3.5–5.2)
Alkaline Phosphatase: 64 U/L (ref 39–117)
BUN: 14 mg/dL (ref 6–23)
CO2: 27 meq/L (ref 19–32)
Calcium: 9.1 mg/dL (ref 8.4–10.5)
Chloride: 103 meq/L (ref 96–112)
Creatinine, Ser: 0.8 mg/dL (ref 0.40–1.20)
GFR: 96.07 mL/min (ref >60.00)
Glucose, Bld: 100 mg/dL — ABNORMAL HIGH (ref 70–99)
Potassium: 4 meq/L (ref 3.5–5.1)
Sodium: 137 meq/L (ref 135–145)
Total Bilirubin: 0.3 mg/dL (ref 0.2–1.2)
Total Protein: 8.4 g/dL — ABNORMAL HIGH (ref 6.0–8.3)

## 2024-02-20 LAB — TSH: TSH: 1.06 u[IU]/mL (ref 0.35–5.50)

## 2024-02-20 LAB — LIPID PANEL
Cholesterol: 179 mg/dL (ref 0–200)
HDL: 45.1 mg/dL (ref >39.00)
LDL Cholesterol: 111 mg/dL — ABNORMAL HIGH (ref 0–99)
NonHDL: 133.53
Total CHOL/HDL Ratio: 4
Triglycerides: 111 mg/dL (ref 0.0–149.0)
VLDL: 22.2 mg/dL (ref 0.0–40.0)

## 2024-02-20 LAB — HEMOGLOBIN A1C: Hgb A1c MFr Bld: 6.1 % (ref 4.6–6.5)

## 2024-02-20 LAB — SEDIMENTATION RATE: Sed Rate: 69 mm/h — ABNORMAL HIGH (ref 0–20)

## 2024-02-20 MED ORDER — TIRZEPATIDE-WEIGHT MANAGEMENT 7.5 MG/0.5ML ~~LOC~~ SOLN
7.5000 mg | SUBCUTANEOUS | 0 refills | Status: DC
Start: 2024-02-20 — End: 2024-03-21

## 2024-02-20 NOTE — Assessment & Plan Note (Signed)
Chronic, stable. Check A1c today and treat based on results.

## 2024-02-20 NOTE — Assessment & Plan Note (Signed)
 BMI 38.5 associated with hypertension and prediabetes. She has lost 12 pounds since her last visit. Continue focus on nutrition and exercise. Increase zepbound  to 7.5mg  injection weekly. Follow-up in 3 months.

## 2024-02-20 NOTE — Patient Instructions (Signed)
 It was great to see you!  We are checking your labs today and will let you know the results via mychart/phone.   Reach out to the rheumatologist about your dry mouth  You can start biotene mouth wash to help with moisturizing   Increase your zepbound  to 7.5mg  weekly   Let's follow-up in 3 months, sooner if you have concerns.  If a referral was placed today, you will be contacted for an appointment. Please note that routine referrals can sometimes take up to 3-4 weeks to process. Please call our office if you haven't heard anything after this time frame.  Take care,  Tinnie Harada, NP

## 2024-02-20 NOTE — Assessment & Plan Note (Signed)
 Health maintenance reviewed and updated. Discussed nutrition, exercise. Follow-up 1 year.

## 2024-02-20 NOTE — Assessment & Plan Note (Signed)
 She has been wearing CPAP at night. She has been having more energy during the day.

## 2024-02-20 NOTE — Assessment & Plan Note (Signed)
 She did have low test positive for Sjogren's. Will check ESR and encouraged her to follow-up with rheumatology. Continue sipping fluids during the day and can try biotene mouth wash.

## 2024-02-20 NOTE — Assessment & Plan Note (Signed)
 Chronic, stable.  Continue labetalol  to 200 mg twice a day and continue nifedipine  XL 30 mg daily.  Continue checking blood pressure at home.  Check CMP, CBC, lipid panel today.

## 2024-02-20 NOTE — Progress Notes (Signed)
 BP 120/80 (BP Location: Right Arm, Patient Position: Sitting)   Pulse 73   Temp 98 F (36.7 C) (Temporal)   Ht 5' 6 (1.676 m)   Wt 238 lb 12.8 oz (108.3 kg)   LMP 02/14/2024 (Exact Date)   SpO2 97%   BMI 38.54 kg/m    Subjective:    Patient ID: Leslie Arroyo, female    DOB: Feb 14, 1991, 33 y.o.   MRN: [id number]  CC: Chief Complaint  Patient presents with   Annual Exam    Dry mouth want information about that, things are going well with zepbound .     HPI: Leslie Arroyo is a 33 y.o. female presenting on 02/20/2024 for comprehensive medical examination. Current medical complaints include:dry mouth  She uses Zepbound  5 mg for weight management and has experienced weight loss. Nausea predates the medication, and she experiences occasional constipation, which she manages with increased fiber intake and spicy foods. She has Miralax at home but prefers dietary changes first. Dry mouth occurs, especially in the summer, described as 'cottony,' with no dry eyes reported. She stays hydrated by drinking plenty of water.  She currently lives with: husband Menopausal Symptoms: no  Depression and Anxiety Screen done today and results listed below:     07/23/2023    1:52 PM 01/02/2023    9:48 AM 01/02/2023    9:33 AM 06/12/2022    8:40 AM 05/22/2022   10:36 AM  Depression screen PHQ 2/9  Decreased Interest 0 0 0 0 0  Down, Depressed, Hopeless 0 0 0 0 0  PHQ - 2 Score 0 0 0 0 0  Altered sleeping   0    Tired, decreased energy   0    Change in appetite   0    Feeling bad or failure about yourself    0    Trouble concentrating   0    Moving slowly or fidgety/restless   0    Suicidal thoughts   0    PHQ-9 Score   0        01/02/2023    9:33 AM 05/03/2022   11:07 AM 01/27/2020   11:51 AM 07/01/2018    8:39 AM  GAD 7 : Generalized Anxiety Score  Nervous, Anxious, on Edge 0 0 0 0  Control/stop worrying 0 0 0 0  Worry too much - different things 0 0 0 0  Trouble relaxing 0 0 0 0   Restless 0 0 0 0  Easily annoyed or irritable 0 0 0 0  Afraid - awful might happen 0 0 0 0  Total GAD 7 Score 0 0 0 0  Anxiety Difficulty  Not difficult at all      The patient does not have a history of falls. I did not complete a risk assessment for falls. A plan of care for falls was not documented.   Past Medical History:  Past Medical History:  Diagnosis Date   Kidney stones    Prediabetes     Surgical History:  Past Surgical History:  Procedure Laterality Date   EXTRACORPOREAL SHOCK WAVE LITHOTRIPSY Left 09/16/2018   Procedure: EXTRACORPOREAL SHOCK WAVE LITHOTRIPSY (ESWL);  Surgeon: Devere Lonni Righter, MD;  Location: [facility];  Service: Urology;  Laterality: Left;   HARDWARE REMOVAL Right    leg   KNEE ARTHROSCOPY Left    LEG SURGERY Right    WISDOM TOOTH EXTRACTION      Medications:  Current Outpatient Medications on File Prior to  Visit  Medication Sig   labetalol  (NORMODYNE ) 200 MG tablet Take 1 tablet by mouth twice daily   methocarbamol  (ROBAXIN ) 500 MG tablet Take 1 tablet (500 mg total) by mouth every 8 (eight) hours as needed for muscle spasms.   NIFEdipine  (PROCARDIA -XL/NIFEDICAL-XL) 30 MG 24 hr tablet Take 1 tablet by mouth once daily   ondansetron  (ZOFRAN -ODT) 4 MG disintegrating tablet Take 1 tablet (4 mg total) by mouth every 8 (eight) hours as needed for nausea or vomiting.   Prenatal Vit-Fe Fumarate-FA (PRENATAL MULTIVITAMIN) TABS tablet Take 1 tablet by mouth daily at 12 noon.   No current facility-administered medications on file prior to visit.    Allergies:  Allergies  Allergen Reactions   Dust Mite Extract     Social History:  Social History   Socioeconomic History   Marital status: Married    Spouse name: Not on file   Number of children: Not on file   Years of education: Not on file   Highest education level: Not on file  Occupational History   Not on file  Tobacco Use   Smoking status: Never    Passive exposure: Never    Smokeless tobacco: Never  Vaping Use   Vaping status: Never Used  Substance and Sexual Activity   Alcohol use: Yes    Comment: socially   Drug use: Never   Sexual activity: Yes    Birth control/protection: None  Other Topics Concern   Not on file  Social History Narrative   Not on file   Social Drivers of Health   Financial Resource Strain: Not on file  Food Insecurity: No Food Insecurity (01/27/2020)   Hunger Vital Sign    Worried About Running Out of Food in the Last Year: Never true    Ran Out of Food in the Last Year: Never true  Transportation Needs: No Transportation Needs (01/27/2020)   PRAPARE - Administrator, Civil Service (Medical): No    Lack of Transportation (Non-Medical): No  Physical Activity: Not on file  Stress: Not on file  Social Connections: Not on file  Intimate Partner Violence: Not on file   Social History   Tobacco Use  Smoking Status Never   Passive exposure: Never  Smokeless Tobacco Never   Social History   Substance and Sexual Activity  Alcohol Use Yes   Comment: socially    Family History:  Family History  Problem Relation Age of Onset   Hypertension Mother    Diabetes Mother    Rheum arthritis Mother    Lupus Mother    Cancer Father 19       kidney cancer   Congenital heart disease Brother    Breast cancer Paternal Uncle 32   Rheum arthritis Maternal Grandmother    Hypertension Maternal Grandmother    Diabetes Maternal Grandmother    Cancer Maternal Grandmother        stomach   Hypertension Maternal Grandfather    Diabetes Maternal Grandfather     Past medical history, surgical history, medications, allergies, family history and social history reviewed with patient today and changes made to appropriate areas of the chart.   Review of Systems  Constitutional: Negative.   HENT:  Negative for congestion and sore throat.        Dry mouth  Eyes: Negative.   Respiratory: Negative.    Cardiovascular: Negative.    Gastrointestinal:  Positive for constipation (at times) and nausea. Negative for abdominal pain, diarrhea and vomiting.  Genitourinary: Negative.   Musculoskeletal: Negative.   Skin: Negative.   Neurological: Negative.   Psychiatric/Behavioral: Negative.     All other ROS negative except what is listed above and in the HPI.      Objective:    BP 120/80 (BP Location: Right Arm, Patient Position: Sitting)   Pulse 73   Temp 98 F (36.7 C) (Temporal)   Ht 5' 6 (1.676 m)   Wt 238 lb 12.8 oz (108.3 kg)   LMP 02/14/2024 (Exact Date)   SpO2 97%   BMI 38.54 kg/m   Wt Readings from Last 3 Encounters:  02/20/24 238 lb 12.8 oz (108.3 kg)  11/19/23 250 lb 12.8 oz (113.8 kg)  09/25/23 257 lb (116.6 kg)    Physical Exam Vitals and nursing note reviewed.  Constitutional:      General: She is not in acute distress.    Appearance: Normal appearance.  HENT:     Head: Normocephalic and atraumatic.     Right Ear: Tympanic membrane, ear canal and external ear normal.     Left Ear: Tympanic membrane, ear canal and external ear normal.     Mouth/Throat:     Mouth: Mucous membranes are moist.     Pharynx: No posterior oropharyngeal erythema.  Eyes:     Conjunctiva/sclera: Conjunctivae normal.  Cardiovascular:     Rate and Rhythm: Normal rate and regular rhythm.     Pulses: Normal pulses.     Heart sounds: Normal heart sounds.  Pulmonary:     Effort: Pulmonary effort is normal.     Breath sounds: Normal breath sounds.  Abdominal:     Palpations: Abdomen is soft.     Tenderness: There is no abdominal tenderness.  Musculoskeletal:        General: Normal range of motion.     Cervical back: Normal range of motion and neck supple.     Right lower leg: No edema.     Left lower leg: No edema.  Lymphadenopathy:     Cervical: No cervical adenopathy.  Skin:    General: Skin is warm and dry.  Neurological:     General: No focal deficit present.     Mental Status: She is alert and oriented  to person, place, and time.     Cranial Nerves: No cranial nerve deficit.     Coordination: Coordination normal.     Gait: Gait normal.  Psychiatric:        Mood and Affect: Mood normal.        Behavior: Behavior normal.        Thought Content: Thought content normal.        Judgment: Judgment normal.     Results for orders placed or performed in visit on 09/04/23  POCT glycosylated hemoglobin (Hb A1C)   Collection Time: 09/04/23 11:26 AM  Result Value Ref Range   Hemoglobin A1C 5.9 (A) 4.0 - 5.6 %   HbA1c POC (<> result, manual entry) 5.9 4.0 - 5.6 %   HbA1c, POC (prediabetic range) 5.9 5.7 - 6.4 %   HbA1c, POC (controlled diabetic range) 5.9 0.0 - 7.0 %      Assessment & Plan:   Problem List Items Addressed This Visit       Cardiovascular and Mediastinum   Primary hypertension   Chronic, stable.  Continue labetalol  to 200 mg twice a day and continue nifedipine  XL 30 mg daily.  Continue checking blood pressure at home.  Check CMP, CBC, lipid panel  today.       Relevant Orders   CBC with Differential/Platelet   Lipid panel   Comprehensive metabolic panel with GFR   TSH     Respiratory   OSA (obstructive sleep apnea)   She has been wearing CPAP at night. She has been having more energy during the day.         Other   Prediabetes   Chronic, stable. Check A1c today and treat based on results.       Relevant Orders   Hemoglobin A1c   Morbid obesity (HCC)   BMI 38.5 associated with hypertension and prediabetes. She has lost 12 pounds since her last visit. Continue focus on nutrition and exercise. Increase zepbound  to 7.5mg  injection weekly. Follow-up in 3 months.       Relevant Medications   tirzepatide  7.5 MG/0.5ML injection vial   Dry mouth   She did have low test positive for Sjogren's. Will check ESR and encouraged her to follow-up with rheumatology. Continue sipping fluids during the day and can try biotene mouth wash.       Relevant Orders    Sedimentation rate   TSH   Routine general medical examination at a health care facility - Primary   Health maintenance reviewed and updated. Discussed nutrition, exercise. Follow-up 1 year.        Other Visit Diagnoses       Immunization due       hepatitis B vaccine given today. Come back in 30 days for nurse visit for second dose.   Relevant Orders   Heplisav-B  (HepB-CPG) Vaccine (Completed)        Follow up plan: Return in about 3 months (around 05/22/2024) for weight management .   LABORATORY TESTING:  - Pap smear: up to date  IMMUNIZATIONS:   - Tdap: Tetanus vaccination status reviewed: last tetanus booster within 10 years. - Influenza: Postponed to flu season - Pneumovax: Not applicable - Prevnar: Not applicable - HPV: Up to date - Shingrix vaccine: Not applicable  SCREENING: -Mammogram: Not applicable  - Colonoscopy: Not applicable  - Bone Density: Not applicable   PATIENT COUNSELING:   Advised to take 1 mg of folate supplement per day if capable of pregnancy.   Sexuality: Discussed sexually transmitted diseases, partner selection, use of condoms, avoidance of unintended pregnancy  and contraceptive alternatives.   Advised to avoid cigarette smoking.  I discussed with the patient that most people either abstain from alcohol or drink within safe limits (<=14/week and <=4 drinks/occasion for males, <=7/weeks and <= 3 drinks/occasion for females) and that the risk for alcohol disorders and other health effects rises proportionally with the number of drinks per week and how often a drinker exceeds daily limits.  Discussed cessation/primary prevention of drug use and availability of treatment for abuse.   Diet: Encouraged to adjust caloric intake to maintain  or achieve ideal body weight, to reduce intake of dietary saturated fat and total fat, to limit sodium intake by avoiding high sodium foods and not adding table salt, and to maintain adequate dietary potassium  and calcium preferably from fresh fruits, vegetables, and low-fat dairy products.    stressed the importance of regular exercise  Injury prevention: Discussed safety belts, safety helmets, smoke detector, smoking near bedding or upholstery.   Dental health: Discussed importance of regular tooth brushing, flossing, and dental visits.    NEXT PREVENTATIVE PHYSICAL DUE IN 1 YEAR. Return in about 3 months (around 05/22/2024) for weight management .  Leslie Arroyo

## 2024-02-25 ENCOUNTER — Encounter: Payer: Self-pay | Admitting: Nurse Practitioner

## 2024-03-07 ENCOUNTER — Telehealth: Payer: Self-pay

## 2024-03-07 NOTE — Telephone Encounter (Signed)
 Patient called the office to schedule a follow up ,PCP recommended her follow up with Rhuem after having another positive ANA and symptoms of dry mouth. Follow up on Monday when back in the office.

## 2024-03-10 NOTE — Progress Notes (Unsigned)
 Office Visit Note  Patient: Leslie Arroyo             Date of Birth: 12-24-1990           MRN: [id number]             PCP: Nedra Tinnie LABOR, NP Referring: Nedra Tinnie LABOR, NP Visit Date: 03/11/2024 Occupation: @GUAROCC @  Subjective:  No chief complaint on file.   History of Present Illness: Leslie Arroyo is a 33 y.o. female ***     Activities of Daily Living:  Patient reports morning stiffness for *** {minute/hour:19697}.   Patient {ACTIONS;DENIES/REPORTS:21021675::Denies} nocturnal pain.  Difficulty dressing/grooming: {ACTIONS;DENIES/REPORTS:21021675::Denies} Difficulty climbing stairs: {ACTIONS;DENIES/REPORTS:21021675::Denies} Difficulty getting out of chair: {ACTIONS;DENIES/REPORTS:21021675::Denies} Difficulty using hands for taps, buttons, cutlery, and/or writing: {ACTIONS;DENIES/REPORTS:21021675::Denies}  No Rheumatology ROS completed.   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   Excessive daytime sleepiness 09/25/2023   Vivid dream 09/25/2023   Snoring 09/25/2023   Hypersomnia with sleep apnea 09/25/2023   Positive ANA (antinuclear antibody) 02/08/2023   Rash and other nonspecific skin eruption 01/03/2023   Urticaria 01/03/2023   Other allergic rhinitis 01/03/2023   Sore throat 11/20/2022   Gastroesophageal reflux disease 07/10/2022   Primary hypertension 05/03/2022   Morbid obesity (HCC) 05/03/2022   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   Prediabetes 05/30/2021   Family history of kidney stones 10/24/2017   History of kidney stones 08/08/2017    Past Medical History:  Diagnosis Date   Kidney stones    Prediabetes     Family History  Problem Relation Age of Onset   Hypertension Mother    Diabetes Mother    Rheum  arthritis Mother    Lupus Mother    Cancer Father 57       kidney cancer   Congenital heart disease Brother    Breast cancer Paternal Uncle 5   Rheum arthritis Maternal Grandmother    Hypertension Maternal Grandmother    Diabetes Maternal Grandmother    Cancer Maternal Grandmother        stomach   Hypertension Maternal Grandfather    Diabetes Maternal Grandfather    Past Surgical History:  Procedure Laterality Date   EXTRACORPOREAL SHOCK WAVE LITHOTRIPSY Left 09/16/2018   Procedure: EXTRACORPOREAL SHOCK WAVE LITHOTRIPSY (ESWL);  Surgeon: Devere Lonni Righter, MD;  Location: [facility];  Service: Urology;  Laterality: Left;   HARDWARE REMOVAL Right    leg   KNEE ARTHROSCOPY Left    LEG SURGERY Right    WISDOM TOOTH EXTRACTION     Social History   Social History Narrative   Not on file   Immunization History  Administered Date(s) Administered   HPV 9-valent 03/28/2022, 05/22/2022, 10/16/2022   Hepb-cpg 02/20/2024   Influenza, Seasonal, Injecte, Preservative Fre 09/04/2023   Influenza,inj,Quad PF,6+ Mos 03/28/2022   PFIZER(Purple Top)SARS-COV-2 Vaccination 11/14/2019, 12/08/2019   Td 05/04/2015     Objective: Vital Signs: LMP 02/14/2024 (Exact Date)    Physical Exam   Musculoskeletal Exam: ***  CDAI Exam: CDAI Score: -- Patient Global: --; Provider Global: -- Swollen: --; Tender: -- Joint Exam 03/11/2024   No joint exam has been documented for this visit   There is currently no information documented on the homunculus. Go to the Rheumatology activity and complete the homunculus joint exam.  Investigation:  No additional findings.  Imaging: No results found.  Recent Labs: Lab Results  Component Value Date   WBC 9.0 02/20/2024   HGB 11.8 (L) 02/20/2024   PLT 283.0 02/20/2024   NA 137 02/20/2024   K 4.0 02/20/2024   CL 103 02/20/2024   CO2 27 02/20/2024   GLUCOSE 100 (H) 02/20/2024   BUN 14 02/20/2024   CREATININE 0.80 02/20/2024   BILITOT 0.3  02/20/2024   ALKPHOS 64 02/20/2024   AST 13 02/20/2024   ALT 11 02/20/2024   PROT 8.4 (H) 02/20/2024   ALBUMIN 4.3 02/20/2024   CALCIUM 9.1 02/20/2024   GFRAA >60 01/16/2020    Speciality Comments: No specialty comments available.  Procedures:  No procedures performed Allergies: Dust mite extract   Assessment / Plan:     Visit Diagnoses: No diagnosis found.  Orders: No orders of the defined types were placed in this encounter.  No orders of the defined types were placed in this encounter.   Face-to-face time spent with patient was *** minutes. Greater than 50% of time was spent in counseling and coordination of care.  Follow-Up Instructions: No follow-ups on file.   Daved JAYSON Gavel, CMA  Note - This record has been created using Animal nutritionist.  Chart creation errors have been sought, but may not always  have been located. Such creation errors do not reflect on  the standard of medical care.

## 2024-03-11 ENCOUNTER — Ambulatory Visit: Attending: Rheumatology | Admitting: Rheumatology

## 2024-03-11 ENCOUNTER — Encounter: Payer: Self-pay | Admitting: Rheumatology

## 2024-03-11 VITALS — BP 126/89 | HR 77 | Resp 16 | Ht 66.0 in | Wt 236.2 lb

## 2024-03-11 DIAGNOSIS — I10 Essential (primary) hypertension: Secondary | ICD-10-CM

## 2024-03-11 DIAGNOSIS — N970 Female infertility associated with anovulation: Secondary | ICD-10-CM

## 2024-03-11 DIAGNOSIS — M3509 Sicca syndrome with other organ involvement: Secondary | ICD-10-CM

## 2024-03-11 DIAGNOSIS — R21 Rash and other nonspecific skin eruption: Secondary | ICD-10-CM

## 2024-03-11 DIAGNOSIS — M542 Cervicalgia: Secondary | ICD-10-CM

## 2024-03-11 DIAGNOSIS — J3089 Other allergic rhinitis: Secondary | ICD-10-CM

## 2024-03-11 DIAGNOSIS — M5412 Radiculopathy, cervical region: Secondary | ICD-10-CM

## 2024-03-11 DIAGNOSIS — N96 Recurrent pregnancy loss: Secondary | ICD-10-CM

## 2024-03-11 DIAGNOSIS — K219 Gastro-esophageal reflux disease without esophagitis: Secondary | ICD-10-CM

## 2024-03-11 DIAGNOSIS — R700 Elevated erythrocyte sedimentation rate: Secondary | ICD-10-CM | POA: Diagnosis not present

## 2024-03-11 DIAGNOSIS — R7303 Prediabetes: Secondary | ICD-10-CM

## 2024-03-11 DIAGNOSIS — R768 Other specified abnormal immunological findings in serum: Secondary | ICD-10-CM

## 2024-03-11 DIAGNOSIS — Z87442 Personal history of urinary calculi: Secondary | ICD-10-CM

## 2024-03-18 LAB — CRYOGLOBULIN: Cryoglobulin, Qualitative Analysis: NOT DETECTED

## 2024-03-18 LAB — RHEUMATOID FACTOR: Rheumatoid fact SerPl-aCnc: <10 [IU]/mL (ref <14)

## 2024-03-18 LAB — ANTI-NUCLEAR AB-TITER (ANA TITER): ANA Titer 1: 1:40 {titer} — ABNORMAL HIGH

## 2024-03-18 LAB — LUPUS ANTICOAGULANT EVAL W/ REFLEX
PTT-LA Screen: 39 s (ref <=40)
dRVVT: 34 s (ref <=45)

## 2024-03-18 LAB — PROTEIN ELECTROPHORESIS, SERUM, WITH REFLEX
Albumin ELP: 3.8 g/dL (ref 3.8–4.8)
Alpha 1: 0.4 g/dL — ABNORMAL HIGH (ref 0.2–0.3)
Alpha 2: 1 g/dL — ABNORMAL HIGH (ref 0.5–0.9)
Beta 2: 0.6 g/dL — ABNORMAL HIGH (ref 0.2–0.5)
Beta Globulin: 0.5 g/dL (ref 0.4–0.6)
Gamma Globulin: 1.4 g/dL (ref 0.8–1.7)
Total Protein: 7.7 g/dL (ref 6.1–8.1)

## 2024-03-18 LAB — ANTI-DNA ANTIBODY, DOUBLE-STRANDED: ds DNA Ab: 1 [IU]/mL

## 2024-03-18 LAB — C3 AND C4
C3 Complement: 188 mg/dL (ref 83–193)
C4 Complement: 33 mg/dL (ref 15–57)

## 2024-03-18 LAB — ANA: Anti Nuclear Antibody (ANA): POSITIVE — AB

## 2024-03-18 LAB — SJOGRENS SYNDROME-A EXTRACTABLE NUCLEAR ANTIBODY: SSA (Ro) (ENA) Antibody, IgG: 1 AI — AB

## 2024-03-19 ENCOUNTER — Other Ambulatory Visit (HOSPITAL_COMMUNITY): Payer: Self-pay

## 2024-03-19 ENCOUNTER — Encounter: Payer: Self-pay | Admitting: Nurse Practitioner

## 2024-03-19 NOTE — Telephone Encounter (Signed)
 Can you please and thank you re-submit the PA for the Tirzepatide ? Thanks. Dm/cma

## 2024-03-20 ENCOUNTER — Other Ambulatory Visit (HOSPITAL_COMMUNITY): Payer: Self-pay

## 2024-03-20 ENCOUNTER — Telehealth: Payer: Self-pay

## 2024-03-20 NOTE — Telephone Encounter (Signed)
 Pharmacy Patient Advocate Encounter   Received notification from Patient Advice Request messages that prior authorization for Zepbound  7.5MG /0.5ML pen-injectors  is required/requested.   Insurance verification completed.   The patient is insured through [facility] .   Per test claim: PA required; PA submitted to above mentioned insurance via Latent Key/confirmation #/EOC [employer] Status is pending

## 2024-03-20 NOTE — Telephone Encounter (Signed)
 Pharmacy Patient Advocate Encounter  Received notification from [employer] that Prior Authorization for Zepbound  7.5MG /0.5ML pen-injectors has been APPROVED from 03/20/24 to 03/20/25. Ran test claim, Copay is $24.99. This test claim was processed through [facility]- copay amounts may vary at other pharmacies due to pharmacy/plan contracts, or as the patient moves through the different stages of their insurance plan.   PA #/Case ID/Reference #: [id number]

## 2024-03-21 ENCOUNTER — Ambulatory Visit (INDEPENDENT_AMBULATORY_CARE_PROVIDER_SITE_OTHER)

## 2024-03-21 ENCOUNTER — Telehealth: Payer: Self-pay

## 2024-03-21 ENCOUNTER — Other Ambulatory Visit: Payer: Self-pay | Admitting: Family

## 2024-03-21 DIAGNOSIS — Z23 Encounter for immunization: Secondary | ICD-10-CM

## 2024-03-21 MED ORDER — TIRZEPATIDE-WEIGHT MANAGEMENT 7.5 MG/0.5ML ~~LOC~~ SOAJ: 7.5000 mg | SUBCUTANEOUS | 1 refills | Status: DC

## 2024-03-21 NOTE — Progress Notes (Signed)
 After obtaining informed consent, the immunization Hep B is given by Flonnie Norse, CMA II.

## 2024-03-21 NOTE — Telephone Encounter (Signed)
 Copied from CRM [id number]. Topic: Clinical - Medication Prior Auth >> Mar 21, 2024 12:47 PM Chiquita SQUIBB wrote: Reason for CRM: Hadassah from [facility] is calling in to confirm to authorization for  Zepbound  was approved starting today for a year, she will be faxing this as well. The reference number is [id number] and a good call back number with any questions is [phone]

## 2024-03-24 NOTE — Telephone Encounter (Signed)
 I called [facility] and spoke with pharmacist and patient has picked up Rx of Zepbound  on 03/22/24.

## 2024-04-11 ENCOUNTER — Encounter: Payer: Self-pay | Admitting: Nurse Practitioner

## 2024-05-12 ENCOUNTER — Other Ambulatory Visit: Payer: Self-pay | Admitting: Family

## 2024-05-15 ENCOUNTER — Encounter: Payer: Self-pay | Admitting: Nurse Practitioner

## 2024-05-15 MED ORDER — ONDANSETRON 4 MG PO TBDP: 4.0000 mg | ORAL_TABLET | Freq: Three times a day (TID) | ORAL | 1 refills | Status: AC | PRN

## 2024-05-15 NOTE — Telephone Encounter (Signed)
 Requesting: ondansetron  4 MG disintegrating tablet  Last Visit: 02/20/2024 Next Visit: 06/10/2024 Last Refill: 09/04/2023  Please Advise

## 2024-05-22 ENCOUNTER — Ambulatory Visit: Admitting: Nurse Practitioner

## 2024-06-03 ENCOUNTER — Other Ambulatory Visit: Payer: Self-pay | Admitting: Nurse Practitioner

## 2024-06-03 NOTE — Telephone Encounter (Signed)
 Requesting: Zepbound  7.5 MG/0.5ML Subcutaneous Solution Auto-injector  Last Visit: 02/20/2024 Next Visit: 06/10/2024 Last Refill: 03/21/2024  Please Advise

## 2024-06-10 ENCOUNTER — Ambulatory Visit: Admitting: Nurse Practitioner

## 2024-06-12 ENCOUNTER — Ambulatory Visit: Admitting: Nurse Practitioner

## 2024-06-13 ENCOUNTER — Ambulatory Visit: Admitting: Nurse Practitioner

## 2024-06-13 ENCOUNTER — Encounter: Payer: Self-pay | Admitting: Nurse Practitioner

## 2024-06-13 ENCOUNTER — Ambulatory Visit: Payer: Self-pay | Admitting: Nurse Practitioner

## 2024-06-13 VITALS — BP 124/88 | HR 76 | Temp 97.3°F | Ht 66.0 in | Wt 230.0 lb

## 2024-06-13 DIAGNOSIS — M3500 Sicca syndrome, unspecified: Secondary | ICD-10-CM | POA: Insufficient documentation

## 2024-06-13 DIAGNOSIS — N750 Cyst of Bartholin's gland: Secondary | ICD-10-CM | POA: Insufficient documentation

## 2024-06-13 DIAGNOSIS — Z3A01 Less than 8 weeks gestation of pregnancy: Secondary | ICD-10-CM

## 2024-06-13 LAB — HCG, QUANTITATIVE, PREGNANCY: Quantitative HCG: 164.26 m[IU]/mL

## 2024-06-13 NOTE — Progress Notes (Signed)
 Established Patient Office Visit  Subjective   Patient ID: Leslie Arroyo, female    DOB: Jan 03, 1991  Age: 33 y.o. MRN: [id number]  Chief Complaint  Patient presents with   Weight Managment    Follow up, positive pregnancy test    HPI Discussed the use of AI scribe software for clinical note transcription with the patient, who gave verbal consent to proceed.  History of Present Illness   Leslie Arroyo is a 33 year old female with a history of miscarriages who presents with early pregnancy.  She is about five weeks pregnant with LMP on November 6. Cycles are usually regular, but this month she had only two days of spotting and then a positive home pregnancy test this week. 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 She stopped Zepbound  when she learned she was pregnant. She denies nausea or urinary frequency and notes persistent breast tenderness.  Last weekend she developed a vaginal cyst that became large and pus-filled. It drained with warm baths and Epsom salt and has since decreased in size. She is avoiding further irritation.  She has Sjogren's syndrome with dry mouth and has not followed up with rheumatology since recent labs. She tested negative for lupus anticoagulant and anti-DNA antibodies. Symptoms are mild and she is not on specific treatment for dry mouth.       ROS See pertinent positives and negatives per HPI.    Objective:     BP 124/88 (BP Location: Left Arm, Patient Position: Sitting, Cuff Size: Normal)   Pulse 76   Temp (!) 97.3 F (36.3 C)   Ht 5' 6 (1.676 m)   Wt 230 lb (104.3 kg)   LMP 05/08/2024 (Exact Date)   SpO2 98%   BMI 37.12 kg/m  BP Readings from Last 3 Encounters:  06/13/24 124/88  03/11/24 126/89  02/20/24 120/80   Wt Readings from Last 3 Encounters:  06/13/24 230 lb (104.3 kg)  03/11/24 236 lb 3.2 oz (107.1 kg)  02/20/24 238 lb 12.8 oz (108.3 kg)      Physical Exam Vitals and nursing note reviewed.   Constitutional:      General: She is not in acute distress.    Appearance: Normal appearance.  HENT:     Head: Normocephalic.  Eyes:     Conjunctiva/sclera: Conjunctivae normal.  Cardiovascular:     Rate and Rhythm: Normal rate and regular rhythm.     Pulses: Normal pulses.     Heart sounds: Normal heart sounds.  Pulmonary:     Effort: Pulmonary effort is normal.     Breath sounds: Normal breath sounds.  Abdominal:     Palpations: Abdomen is soft.     Tenderness: There is no abdominal tenderness.  Musculoskeletal:     Cervical back: Normal range of motion.  Skin:    General: Skin is warm.  Neurological:     General: No focal deficit present.     Mental Status: She is alert and oriented to person, place, and time.  Psychiatric:        Mood and Affect: Mood normal.        Behavior: Behavior normal.        Thought Content: Thought content normal.        Judgment: Judgment normal.      Assessment & Plan:   Problem List Items Addressed This Visit       Genitourinary   Bartholin gland cyst   A recent Bartholin  gland cyst is resolving, reduced in size after warm soaks and Epsom salt baths. Continue warm soaks and Epsom salt baths to aid drainage. Contact the provider if the cyst recurs for potential antibiotic treatment.         Other   Less than [redacted] weeks gestation of pregnancy - Primary   Early pregnancy at 5 weeks with a history of two miscarriages before 9 weeks. Stopped Zepbound  immediately. Continue prenatal vitamin. Referred to OB/GYN for prenatal care. With history of miscarriages, will check HCG levels today and early next week. Avoid ibuprofen , naproxen, and Aleve; Tylenol  is safe. Provided a list of safe over-the-counter medications for colds, coughs, and sleep issues. Limit caffeine to one cup of coffee per day. MiraLAX is recommended for constipation if needed. Avoid sushi and ensure meat is well-cooked. Drink plenty of fluids.      Relevant Orders   Ambulatory  referral to Obstetrics / Gynecology   B-HCG Quant   B-HCG Quant (Completed)   Sjogren's syndrome   Diagnosed with Sjogren's syndrome, negative for lupus, and experiencing mild dry mouth. Recommend she contact the rheumatologist to discuss pregnancy and potential management strategies. Use moisturizing eye drops for dry eyes and mouthwash for dry mouth.       Return if symptoms worsen or fail to improve.    Tinnie DELENA Harada, NP

## 2024-06-13 NOTE — Assessment & Plan Note (Signed)
 A recent Bartholin gland cyst is resolving, reduced in size after warm soaks and Epsom salt baths. Continue warm soaks and Epsom salt baths to aid drainage. Contact the provider if the cyst recurs for potential antibiotic treatment.

## 2024-06-13 NOTE — Assessment & Plan Note (Signed)
 Diagnosed with Sjogren's syndrome, negative for lupus, and experiencing mild dry mouth. Recommend she contact the rheumatologist to discuss pregnancy and potential management strategies. Use moisturizing eye drops for dry eyes and mouthwash for dry mouth.

## 2024-06-13 NOTE — Patient Instructions (Signed)
 It was great to see you!  Stop the zepbound    Keep drinking fluids   You can take your other medications   Let's follow-up in based on results   Take care,  Leslie Harada, NP  Safe Medications in Pregnancy   Acne: Benzoyl Peroxide Salicylic Acid  Backache/Headache: Tylenol : 2 regular strength every 4 hours OR              2 Extra strength every 6 hours  Colds/Coughs/Allergies: Benadryl  (alcohol free) 25 mg every 6 hours as needed Breath right strips Claritin Cepacol throat lozenges Chloraseptic throat spray Cold-Eeze- up to three times per day Cough drops, alcohol free Flonase (by prescription only) Guaifenesin Mucinex Robitussin DM (plain only, alcohol free) Saline nasal spray/drops Sudafed (pseudoephedrine) & Actifed ** use only after [redacted] weeks gestation and if you do not have high blood pressure Tylenol  Vicks Vaporub Zinc lozenges Zyrtec   Constipation: Colace Ducolax suppositories Fleet enema Glycerin suppositories Metamucil Milk of magnesia Miralax Senokot Smooth move tea  Diarrhea: Kaopectate Imodium A-D  *NO pepto Bismol  Hemorrhoids: Anusol Anusol HC Preparation H Tucks  Indigestion: Tums Maalox Mylanta Zantac  Pepcid  Insomnia: Benadryl  (alcohol free) 25mg  every 6 hours as needed Tylenol  PM Unisom, no Gelcaps  Leg Cramps: Tums MagGel  Nausea/Vomiting:  Bonine Dramamine Emetrol Ginger extract Sea bands Meclizine  Nausea medication to take during pregnancy:  Unisom (doxylamine succinate 25 mg tablets) Take one tablet daily at bedtime. If symptoms are not adequately controlled, the dose can be increased to a maximum recommended dose of two tablets daily (1/2 tablet in the morning, 1/2 tablet mid-afternoon and one at bedtime). Vitamin B6 100mg  tablets. Take one tablet twice a day (up to 200 mg per day).  Skin Rashes: Aveeno products Benadryl  cream or 25mg  every 6 hours as needed Calamine Lotion 1% cortisone  cream  Yeast infection: Gyne-lotrimin 7 Monistat 7   **If taking multiple medications, please check labels to avoid duplicating the same active ingredients **take medication as directed on the label ** Do not exceed 4000 mg of tylenol  in 24 hours **Do not take medications that contain aspirin or ibuprofen 

## 2024-06-13 NOTE — Assessment & Plan Note (Signed)
 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 Stopped Zepbound  immediately. Continue prenatal vitamin. Referred to OB/GYN for prenatal care. With history of miscarriages, will check HCG levels today and early next week. Avoid ibuprofen , naproxen, and Aleve; Tylenol  is safe. Provided a list of safe over-the-counter medications for colds, coughs, and sleep issues. Limit caffeine to one cup of coffee per day. MiraLAX is recommended for constipation if needed. Avoid sushi and ensure meat is well-cooked. Drink plenty of fluids.

## 2024-06-15 ENCOUNTER — Encounter: Payer: Self-pay | Admitting: Rheumatology

## 2024-06-16 NOTE — Telephone Encounter (Signed)
 We recommend that all patients with Sjogren's should be on Plaquenil  to reduce fetal cardiac conduction abnormality.  Positive SSA antibody can be associated with fetal cardiac conduction abnormality.  Patient should be monitored closely by her high risk OB .  Please schedule an appointment with [name] or me as soon as possible to start her on Plaquenil .  Please forward all previous labs to her OB.

## 2024-06-17 ENCOUNTER — Telehealth: Payer: Self-pay | Admitting: *Deleted

## 2024-06-17 ENCOUNTER — Other Ambulatory Visit

## 2024-06-17 DIAGNOSIS — Z3A01 Less than 8 weeks gestation of pregnancy: Secondary | ICD-10-CM

## 2024-06-17 LAB — HCG, QUANTITATIVE, PREGNANCY: Quantitative HCG: 193.51 m[IU]/mL

## 2024-06-17 NOTE — Telephone Encounter (Signed)
 Patient advised We recommend that all patients with Sjogren's should be on Plaquenil  to reduce fetal cardiac conduction abnormality. Positive SSA antibody can be associated with fetal cardiac conduction abnormality. Patient should be monitored closely by her high risk OB . Please schedule an appointment with [name] or me as soon as possible to start her on Plaquenil . Please forward all previous labs to her OB. Patient states she has been referred to an OB and is not currently seeing one yet because her last one retired. Will give patient a copy of her labs at her appointment tomorrow to take when she gets in with an OB.

## 2024-06-17 NOTE — Progress Notes (Unsigned)
 Office Visit Note  Patient: Leslie Arroyo             Date of Birth: 30-May-1991           MRN: [id number]             PCP: Nedra Tinnie LABOR, NP Referring: Nedra Tinnie LABOR, NP Visit Date: 06/18/2024 Occupation: Data Unavailable  Subjective:  Sjogren's  History of Present Illness: Leslie Arroyo is a 33 y.o. female with Sjogren's.  She continues to have dry mouth symptoms.  She denies any history of oral ulcers, nasal ulcers, malar rash, photosensitivity, Raynaud's or inflammatory arthritis.  She denies any lymphadenopathy.  She is about [redacted] weeks pregnant now.  She had miscarriages x 2 in the past.    Activities of Daily Living:  Patient reports morning stiffness for 0 minutes.   Patient Denies nocturnal pain.  Difficulty dressing/grooming: Denies Difficulty climbing stairs: Denies Difficulty getting out of chair: Denies Difficulty using hands for taps, buttons, cutlery, and/or writing: Denies  Review of Systems  Constitutional:  Negative for fatigue.  HENT:  Positive for mouth dryness. Negative for mouth sores.   Eyes:  Negative for dryness.  Respiratory:  Negative for shortness of breath.   Cardiovascular:  Negative for chest pain and palpitations.  Gastrointestinal:  Positive for constipation. Negative for blood in stool and diarrhea.  Endocrine: Negative for increased urination.  Genitourinary:  Negative for involuntary urination.  Musculoskeletal:  Negative for joint pain, gait problem, joint pain, joint swelling, myalgias, muscle weakness, morning stiffness, muscle tenderness and myalgias.  Skin:  Negative for color change, rash, hair loss and sensitivity to sunlight.  Allergic/Immunologic: Negative for susceptible to infections.  Neurological:  Negative for dizziness and headaches.  Hematological:  Negative for swollen glands.  Psychiatric/Behavioral:  Negative for depressed mood and sleep disturbance. The patient is not nervous/anxious.     PMFS History:  Patient  Active Problem List   Diagnosis Date Noted   Sjogren's syndrome 06/13/2024   Bartholin gland cyst 06/13/2024   Dry mouth 02/20/2024   Routine general medical examination at a health care facility 02/20/2024   OSA (obstructive sleep apnea) 11/19/2023   Hypnopompic hallucination 10/30/2023   Excessive daytime sleepiness 09/25/2023   Vivid dream 09/25/2023   Snoring 09/25/2023   Hypersomnia with sleep apnea 09/25/2023   Positive ANA (antinuclear antibody) 02/08/2023   Rash and other nonspecific skin eruption 01/03/2023   Urticaria 01/03/2023   Other allergic rhinitis 01/03/2023   Sore throat 11/20/2022   Less than [redacted] weeks gestation of pregnancy 11/09/2022   Gastroesophageal reflux disease 07/10/2022   Primary hypertension 05/03/2022   Morbid obesity (HCC) 05/03/2022   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   Prediabetes 05/30/2021   Family history of kidney stones 10/24/2017   History of kidney stones 08/08/2017    Past Medical History:  Diagnosis Date   Kidney stones    Prediabetes     Family History  Problem Relation Age of Onset   Hypertension Mother    Diabetes Mother    Rheum arthritis Mother    Lupus Mother    Cancer Father 38       kidney cancer   Congenital heart disease Brother    Breast cancer Paternal Uncle 3   Rheum arthritis Maternal Grandmother    Hypertension Maternal Grandmother    Diabetes Maternal Grandmother    Cancer Maternal Grandmother        stomach   Hypertension Maternal Grandfather  Diabetes Maternal Grandfather    Past Surgical History:  Procedure Laterality Date   EXTRACORPOREAL SHOCK WAVE LITHOTRIPSY Left 09/16/2018   Procedure: EXTRACORPOREAL SHOCK WAVE LITHOTRIPSY (ESWL);  Surgeon: Devere Lonni Righter, MD;  Location: [facility];  Service: Urology;  Laterality: Left;   HARDWARE REMOVAL Right    leg   KNEE ARTHROSCOPY Left    LEG SURGERY Right    WISDOM TOOTH EXTRACTION     Social History[1] Social History    Social History Narrative   Not on file     Immunization History  Administered Date(s) Administered   HPV 9-valent 03/28/2022, 05/22/2022, 10/16/2022   Hepb-cpg 02/20/2024, 03/21/2024   Influenza, Seasonal, Injecte, Preservative Fre 09/04/2023   Influenza,inj,Quad PF,6+ Mos 03/28/2022   PFIZER(Purple Top)SARS-COV-2 Vaccination 11/14/2019, 12/08/2019   Td 05/04/2015     Objective: Vital Signs: BP 132/89   Pulse 68   Temp 98.3 F (36.8 C)   Resp 17   Ht 5' 6 (1.676 m)   Wt 231 lb 12.8 oz (105.1 kg)   LMP 05/08/2024 (Exact Date)   BMI 37.41 kg/m    Physical Exam Vitals and nursing note reviewed.  Constitutional:      Appearance: She is well-developed.  HENT:     Head: Normocephalic and atraumatic.  Eyes:     Conjunctiva/sclera: Conjunctivae normal.  Cardiovascular:     Rate and Rhythm: Normal rate and regular rhythm.     Heart sounds: Normal heart sounds.  Pulmonary:     Effort: Pulmonary effort is normal.     Breath sounds: Normal breath sounds.  Abdominal:     General: Bowel sounds are normal.     Palpations: Abdomen is soft.  Musculoskeletal:     Cervical back: Normal range of motion.  Skin:    General: Skin is warm and dry.     Capillary Refill: Capillary refill takes less than 2 seconds.  Neurological:     Mental Status: She is alert and oriented to person, place, and time.  Psychiatric:        Behavior: Behavior normal.      Musculoskeletal Exam: Cervical, thoracic and lumbar spine were in good range of motion.  There was no SI joint tenderness.  Shoulder joints, elbow joints, wrist joints, MCPs, PIPs and DIPs were in good range of motion with no synovitis.  Hip joints and knee joints were in good range of motion without any warmth swelling or effusion.  There was no tenderness over ankles or MTPs.   CDAI Exam: CDAI Score: -- Patient Global: --; Provider Global: -- Swollen: --; Tender: -- Joint Exam 06/18/2024   No joint exam has been documented  for this visit   There is currently no information documented on the homunculus. Go to the Rheumatology activity and complete the homunculus joint exam.  Investigation: No additional findings.  Imaging: No results found.  Recent Labs: Lab Results  Component Value Date   WBC 9.0 02/20/2024   HGB 11.8 (L) 02/20/2024   PLT 283.0 02/20/2024   NA 137 02/20/2024   K 4.0 02/20/2024   CL 103 02/20/2024   CO2 27 02/20/2024   GLUCOSE 100 (H) 02/20/2024   BUN 14 02/20/2024   CREATININE 0.80 02/20/2024   BILITOT 0.3 02/20/2024   ALKPHOS 64 02/20/2024   AST 13 02/20/2024   ALT 11 02/20/2024   PROT 7.7 03/11/2024   ALBUMIN 4.3 02/20/2024   CALCIUM 9.1 02/20/2024   GFRAA >60 01/16/2020    Speciality Comments: No specialty  comments available.  Procedures:  No procedures performed Allergies: Dust mite extract   Assessment / Plan:     Visit Diagnoses: Sjogren's syndrome with other organ involvement - Repeat ANA negative, SSA antibody positive (low titer).  Sed rate elevated (improved), complements normal.  Patient has dry mouth symptoms.  No synovitis was noted on the examination.  She denies history of oral ulcers, nasal ulcers, malar rash, photosensitivity, Raynaud's or inflammatory arthritis.  Association of SSA antibody with arrhythmias, fetal cardiac conduction abnormalities, ILD, lymphoma, inflammatory arthritis were discussed.  Benefits of taking Plaquenil  during pregnancy to prevent fetal cardiac conduction abnormalities was discussed.  Indications side effects contraindications of Plaquenil  use were discussed at length.  Patient was in agreement to proceed with Plaquenil .  A handout was given and consent was taken.  Will start her on Plaquenil  200 mg p.o. twice daily.  She was advised to get a baseline EKG.  Baseline eye examination and annual eye examination was also advised while she is on Plaquenil .  Patient was counseled on the purpose, proper use, and adverse effects of  hydroxychloroquine  including nausea/diarrhea, skin rash, headaches, and sun sensitivity.  Advised patient to wear sunscreen once starting hydroxychloroquine  to reduce risk of rash associated with sun sensitivity.  Discussed importance of annual eye exams while on hydroxychloroquine  to monitor to ocular toxicity and discussed importance of frequent laboratory monitoring.  Provided patient with eye exam form for baseline ophthalmologic exam.  Provided patient with educational materials on hydroxychloroquine  and answered all questions.  Patient consented to hydroxychloroquine . Will upload consent in the media tab.    Reviewed risk for QTC prolongation when used in combination with other QTc prolonging agents (including but not limited to antiarrhythmics, macrolide antibiotics, flouroquinolone antibiotics, haloperidol, quetiapine, olanzapine, risperidone, droperidol, ziprasidone, amitriptyline, citalopram, ondansetron , migraine triptans, and methadone).   High risk medication use-she will come in a month for CBC with differential and CMP with GFR.  Will check labs in 3 months, then every 5 months.  Information reimmunization was placed in the AVS.  She will discuss with her OB.  Baseline eye examination and annual eye examination was advised.  She will get and EKG with her PCP.  Elevated sed rate - Her sed rate fluctuates.  I am uncertain about the etiology of's elevated sed rate.  Less than [redacted] weeks gestation of pregnancy-patient reports 6 weeks of pregnancy.  She had miscarriages x 2 in the past.  Neck pain - improved after physical therapy per patient.  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  Anticardiolipin antibody, lupus anticoagulant and beta-2  GP 1 antibodies were  negative in the past.  Morbid obesity (HCC)  Orders: No orders of the defined types were placed in this encounter.  Meds ordered this encounter  Medications   hydroxychloroquine  (PLAQUENIL ) 200 MG tablet    Sig: Take 1 tablet (200 mg total) by mouth 2 (two) times daily.    Dispense:  180 tablet    Refill:  0     Follow-Up Instructions: Return in about 3 months (around 09/16/2024) for Sjogren's.   Maya Nash, MD  Note - This record has been created using Animal nutritionist.  Chart creation errors have been sought, but may not always  have been located. Such creation errors do not reflect on  the standard of medical  care.     [1]  Social History Tobacco Use   Smoking status: Never    Passive exposure: Never   Smokeless tobacco: Never  Vaping Use   Vaping status: Never Used  Substance Use Topics   Alcohol use: Not Currently   Drug use: Never

## 2024-06-17 NOTE — Telephone Encounter (Signed)
 Error

## 2024-06-17 NOTE — Telephone Encounter (Signed)
Attempted to contact the office and left message for patient to call the office.  

## 2024-06-18 ENCOUNTER — Encounter: Payer: Self-pay | Admitting: Rheumatology

## 2024-06-18 ENCOUNTER — Other Ambulatory Visit: Payer: Self-pay | Admitting: Nurse Practitioner

## 2024-06-18 ENCOUNTER — Ambulatory Visit: Attending: Rheumatology | Admitting: Rheumatology

## 2024-06-18 VITALS — BP 132/89 | HR 68 | Temp 98.3°F | Resp 17 | Ht 66.0 in | Wt 231.8 lb

## 2024-06-18 DIAGNOSIS — Z87442 Personal history of urinary calculi: Secondary | ICD-10-CM

## 2024-06-18 DIAGNOSIS — Z79899 Other long term (current) drug therapy: Secondary | ICD-10-CM

## 2024-06-18 DIAGNOSIS — Z3A01 Less than 8 weeks gestation of pregnancy: Secondary | ICD-10-CM

## 2024-06-18 DIAGNOSIS — M542 Cervicalgia: Secondary | ICD-10-CM | POA: Diagnosis not present

## 2024-06-18 DIAGNOSIS — M3509 Sicca syndrome with other organ involvement: Secondary | ICD-10-CM

## 2024-06-18 DIAGNOSIS — J3089 Other allergic rhinitis: Secondary | ICD-10-CM | POA: Diagnosis not present

## 2024-06-18 DIAGNOSIS — N970 Female infertility associated with anovulation: Secondary | ICD-10-CM | POA: Diagnosis not present

## 2024-06-18 DIAGNOSIS — K219 Gastro-esophageal reflux disease without esophagitis: Secondary | ICD-10-CM

## 2024-06-18 DIAGNOSIS — R21 Rash and other nonspecific skin eruption: Secondary | ICD-10-CM

## 2024-06-18 DIAGNOSIS — N96 Recurrent pregnancy loss: Secondary | ICD-10-CM

## 2024-06-18 DIAGNOSIS — R7303 Prediabetes: Secondary | ICD-10-CM | POA: Diagnosis not present

## 2024-06-18 DIAGNOSIS — I10 Essential (primary) hypertension: Secondary | ICD-10-CM | POA: Diagnosis not present

## 2024-06-18 DIAGNOSIS — R700 Elevated erythrocyte sedimentation rate: Secondary | ICD-10-CM | POA: Diagnosis not present

## 2024-06-18 MED ORDER — HYDROXYCHLOROQUINE SULFATE 200 MG PO TABS: 200.0000 mg | ORAL_TABLET | Freq: Two times a day (BID) | ORAL | 0 refills | Status: AC

## 2024-06-18 NOTE — Patient Instructions (Signed)
 Hydroxychloroquine  Tablets What is this medication? HYDROXYCHLOROQUINE  (hye drox ee KLOR oh kwin) treats autoimmune conditions, such as rheumatoid arthritis and lupus. It works by slowing down an overactive immune system. It may also be used to prevent and treat malaria. It works by killing the parasite that causes malaria. It belongs to a group of medications called DMARDs. This medicine may be used for other purposes; ask your health care provider or pharmacist if you have questions. COMMON BRAND NAME(S): Plaquenil , Quineprox, SOVUNA  What should I tell my care team before I take this medication? They need to know if you have any of these conditions: Diabetes Eye disease, vision problems Frequently drink alcohol G6PD deficiency Heart disease Irregular heartbeat or rhythm Kidney disease Liver disease Porphyria Psoriasis An unusual or allergic reaction to hydroxychloroquine , other medications, foods, dyes, or preservatives Pregnant or trying to get pregnant Breastfeeding How should I use this medication? Take this medication by mouth with water. Take it as directed on the prescription label. Do not cut, crush, or chew this medication. Swallow the tablets whole. Take it with food. Do not take it more than directed. Take all of this medication unless your care team tells you to stop it early. Keep taking it even if you think you are better. Take products with antacids in them at a different time of day than this medication. Take this medication 4 hours before or 4 hours after antacids. Talk to your care team if you have questions. Talk to your care team about the use of this medication in children. While this medication may be prescribed for selected conditions, precautions do apply. Overdosage: If you think you have taken too much of this medicine contact a poison control center or emergency room at once. NOTE: This medicine is only for you. Do not share this medicine with others. What if I  miss a dose? If you miss a dose, take it as soon as you can. If it is almost time for your next dose, take only that dose. Do not take double or extra doses. What may interact with this medication? Do not take this medication with any of the following: Cisapride Dronedarone Pimozide Thioridazine This medication may also interact with the following: Ampicillin Antacids Cimetidine Cyclosporine Digoxin Kaolin Medications for diabetes, such as insulin, glipizide, glyburide Medications for seizures, such as carbamazepine, phenobarbital, phenytoin Mefloquine Methotrexate Other medications that cause heart rhythm changes Praziquantel This list may not describe all possible interactions. Give your health care provider a list of all the medicines, herbs, non-prescription drugs, or dietary supplements you use. Also tell them if you smoke, drink alcohol, or use illegal drugs. Some items may interact with your medicine. What should I watch for while using this medication? Visit your care team for regular checks on your progress. Tell your care team if your symptoms do not start to get better or if they get worse. You may need blood work done while you are taking this medication. If you take other medications that can affect heart rhythm, you may need more testing. Talk to your care team if you have questions. Your vision may be tested before and during use of this medication. Tell your care team right away if you have any change in your eyesight. This medication may cause serious skin reactions. They can happen weeks to months after starting the medication. Contact your care team right away if you notice fevers or flu-like symptoms with a rash. The rash may be red or purple and then  turn into blisters or peeling of the skin. Or, you might notice a red rash with swelling of the face, lips or lymph nodes in your neck or under your arms. If you or your family notice any changes in your behavior, such as  new or worsening depression, thoughts of harming yourself, anxiety, or other unusual or disturbing thoughts, or memory loss, call your care team right away. What side effects may I notice from receiving this medication? Side effects that you should report to your care team as soon as possible: Allergic reactions--skin rash, itching, hives, swelling of the face, lips, tongue, or throat Aplastic anemia--unusual weakness or fatigue, dizziness, headache, trouble breathing, increased bleeding or bruising Change in vision Heart rhythm changes--fast or irregular heartbeat, dizziness, feeling faint or lightheaded, chest pain, trouble breathing Infection--fever, chills, cough, or sore throat Low blood sugar (hypoglycemia)--tremors or shaking, anxiety, sweating, cold or clammy skin, confusion, dizziness, rapid heartbeat Muscle injury--unusual weakness or fatigue, muscle pain, dark yellow or brown urine, decrease in amount of urine Pain, tingling, or numbness in the hands or feet Rash, fever, and swollen lymph nodes Redness, blistering, peeling, or loosening of the skin, including inside the mouth Thoughts of suicide or self-harm, worsening mood, or feelings of depression Unusual bruising or bleeding Side effects that usually do not require medical attention (report to your care team if they continue or are bothersome): Diarrhea Headache Nausea Stomach pain Vomiting This list may not describe all possible side effects. Call your doctor for medical advice about side effects. You may report side effects to FDA at 1-800-FDA-1088. Where should I keep my medication? Keep out of the reach of children and pets. Store at room temperature up to 30 degrees C (86 degrees F). Protect from light. Get rid of any unused medication after the expiration date. To get rid of medications that are no longer needed or have expired: Take the medication to a medication take-back program. Check with your pharmacy or law  enforcement to find a location. If you cannot return the medication, check the label or package insert to see if the medication should be thrown out in the garbage or flushed down the toilet. If you are not sure, ask your care team. If it is safe to put it in the trash, empty the medication out of the container. Mix the medication with cat litter, dirt, coffee grounds, or other unwanted substance. Seal the mixture in a bag or container. Put it in the trash. NOTE: This sheet is a summary. It may not cover all possible information. If you have questions about this medicine, talk to your doctor, pharmacist, or health care provider.  2024 Elsevier/Gold Standard (2021-12-26 00:00:00)  Standing Labs We placed an order today for your standing lab work.   Please have your standing labs drawn in 1 month, 3 months and then every 5 months  Please have your labs drawn 2 weeks prior to your appointment so that the provider can discuss your lab results at your appointment, if possible.  Please note that you may see your imaging and lab results in MyChart before we have reviewed them. We will contact you once all results are reviewed. Please allow our office up to 72 hours to thoroughly review all of the results before contacting the office for clarification of your results.  WALK-IN LAB HOURS  Monday through Thursday from 8:00 am - 4:30 pm and Friday from 8:00 am-12:00 pm.  Patients with office visits requiring labs will be seen before  walk-in labs.  You may encounter longer than normal wait times. Please allow additional time. Wait times may be shorter on  Monday and Thursday afternoons.  We do not book appointments for walk-in labs. We appreciate your patience and understanding with our staff.   Labs are drawn by [facility]. Please bring your co-pay at the time of your lab draw.  You may receive a bill from [facility] for your lab work.  Please note if you are on Hydroxychloroquine  and and an order has been  placed for a Hydroxychloroquine  level,  you will need to have it drawn 4 hours or more after your last dose.  If you wish to have your labs drawn at another location, please call the office 24 hours in advance so we can fax the orders.  The office is located at [address], [place], [place] [address]   If you have any questions regarding directions or hours of operation,  please call [phone].   As a reminder, please drink plenty of water prior to coming for your lab work. Thanks!   Vaccines You are taking a medication(s) that can suppress your immune system.  The following immunizations are recommended: Flu annually Covid-19  Td/Tdap (tetanus, diphtheria, pertussis) every 10 years Pneumonia (Prevnar 15 then Pneumovax 23 at least 1 year apart.  Alternatively, can take Prevnar 20 without needing additional dose) Shingrix: 2 doses from 4 weeks to 6 months apart  Please check with your PCP to make sure you are up to date.

## 2024-06-19 ENCOUNTER — Other Ambulatory Visit

## 2024-06-19 ENCOUNTER — Ambulatory Visit: Payer: Self-pay | Admitting: Nurse Practitioner

## 2024-06-19 DIAGNOSIS — Z3A01 Less than 8 weeks gestation of pregnancy: Secondary | ICD-10-CM

## 2024-06-19 LAB — HCG, QUANTITATIVE, PREGNANCY: Quantitative HCG: 274.03 m[IU]/mL

## 2024-06-20 ENCOUNTER — Ambulatory Visit (HOSPITAL_BASED_OUTPATIENT_CLINIC_OR_DEPARTMENT_OTHER)
Admission: RE | Admit: 2024-06-20 | Discharge: 2024-06-20 | Disposition: A | Discharge status: Home / Self Care | Source: Ambulatory Visit | Attending: Nurse Practitioner | Admitting: Nurse Practitioner

## 2024-06-20 DIAGNOSIS — Z3481 Encounter for supervision of other normal pregnancy, first trimester: Secondary | ICD-10-CM | POA: Diagnosis present

## 2024-06-20 DIAGNOSIS — Z3A01 Less than 8 weeks gestation of pregnancy: Secondary | ICD-10-CM | POA: Diagnosis not present

## 2024-06-23 ENCOUNTER — Ambulatory Visit: Payer: Self-pay | Admitting: Nurse Practitioner

## 2024-06-23 DIAGNOSIS — Z3A01 Less than 8 weeks gestation of pregnancy: Secondary | ICD-10-CM

## 2024-06-23 NOTE — Telephone Encounter (Signed)
 Patient called back and a lab visit made for tomorrow, 06/24/24.

## 2024-06-24 ENCOUNTER — Encounter (HOSPITAL_COMMUNITY): Payer: Self-pay | Admitting: Obstetrics & Gynecology

## 2024-06-24 ENCOUNTER — Inpatient Hospital Stay (HOSPITAL_COMMUNITY)
Admission: AD | Admit: 2024-06-24 | Discharge: 2024-06-24 | Disposition: A | Discharge status: Home / Self Care | Attending: Obstetrics & Gynecology | Admitting: Obstetrics & Gynecology

## 2024-06-24 ENCOUNTER — Other Ambulatory Visit (INDEPENDENT_AMBULATORY_CARE_PROVIDER_SITE_OTHER)

## 2024-06-24 ENCOUNTER — Ambulatory Visit: Payer: Self-pay | Admitting: Nurse Practitioner

## 2024-06-24 DIAGNOSIS — O200 Threatened abortion: Secondary | ICD-10-CM | POA: Diagnosis not present

## 2024-06-24 DIAGNOSIS — Z3A01 Less than 8 weeks gestation of pregnancy: Secondary | ICD-10-CM | POA: Diagnosis not present

## 2024-06-24 DIAGNOSIS — N939 Abnormal uterine and vaginal bleeding, unspecified: Secondary | ICD-10-CM | POA: Diagnosis not present

## 2024-06-24 DIAGNOSIS — O4691 Antepartum hemorrhage, unspecified, first trimester: Secondary | ICD-10-CM | POA: Diagnosis present

## 2024-06-24 LAB — URINALYSIS, MICROSCOPIC (REFLEX): RBC / HPF: >50 RBC/hpf (ref 0–5)

## 2024-06-24 LAB — HCG, QUANTITATIVE, PREGNANCY: Quantitative HCG: 929.58 m[IU]/mL

## 2024-06-24 LAB — URINALYSIS, ROUTINE W REFLEX MICROSCOPIC

## 2024-06-24 NOTE — MAU Provider Note (Signed)
"   Event Date/Time   First Provider Initiated Contact with Patient 06/24/24 2118      [name] is a 33 y.o. G3P0020 patient who presents to MAU today with complaint of vaginal bleeding and passing clots. Reports on-going bleeding since last week, but reports going through 4-5 pads today and passing clots. Also reports intermittent abdominal cramping. None at this time. Was seen in OB office this am for HCG and was found to have an appropriately rising quant. US  on 12/19 found to have a PUL. Hx of 2 previous losses and is worried that this is also a miscarriage. Husband present for visit and contributing to HPI.   O BP 138/87 (BP Location: Right Arm)   Pulse 78   Temp 98.2 F (36.8 C)   Resp 17   Ht 5' 6 (1.676 m)   Wt 104.8 kg   LMP 05/08/2024 (Exact Date)   SpO2 98%   BMI 37.28 kg/m  Physical Exam Vitals and nursing note reviewed.  Constitutional:      General: She is not in acute distress.    Appearance: Normal appearance.     Comments: Patient tearful   HENT:     Head: Normocephalic.  Pulmonary:     Effort: Pulmonary effort is normal.  Musculoskeletal:     Cervical back: Normal range of motion.  Skin:    General: Skin is warm and dry.  Neurological:     Mental Status: She is alert and oriented to person, place, and time.  Psychiatric:        Mood and Affect: Mood normal.     A Medical screening exam complete - HCG from this morning 929.  - Repeat not recommended at this time due to lack of appropriate time lapse between testing.  - US  not recommended as findings are unlikely to have changed since last week given gestation more likely 3-4 weeks based on HCG levels.   - Plan for discharge.   P 1. Vaginal bleeding   2. Threatened miscarriage   3. [redacted] weeks gestation of pregnancy    - Recommended repeat HCG levels in 48 hours. Due to holiday and patient decline to be seen in the office on Friday. Appointment made for patient to return to MAU on 12/25.  Patient agreeable to plan of care.   - Reviewed worsening signs and return precautions  - Patient discharged home in stable condition and may return to MAU as needed.   Leslie Arroyo) Emilio, MSN, CNM  [facility]  06/24/2024 10:39 PM    "

## 2024-06-24 NOTE — Progress Notes (Signed)
 Claris Cedar CNM in to see pt and discuss test results and d/c home. WRitten and verbal d/c instructions given by CNM and pt d/c home.

## 2024-06-24 NOTE — MAU Note (Signed)
 Leslie Arroyo is a 33 y.o. at [redacted]w[redacted]d here in MAU reporting vaginal bleeding today where she has soaked 4-5 pads. Mild cramping. She has had 2 SABs in the past and is worried this could be a miscarriage  LMP: 05/08/24 Onset of complaint: today Pain score: 4 Vitals:   06/24/24 1929  BP: 138/87  Pulse: 78  Resp: 17  Temp: 98.2 F (36.8 C)  SpO2: 98%     FHT: na  Lab orders placed from triage: ua

## 2024-06-26 ENCOUNTER — Encounter (HOSPITAL_COMMUNITY): Payer: Self-pay

## 2024-06-26 ENCOUNTER — Other Ambulatory Visit: Payer: Self-pay

## 2024-06-26 ENCOUNTER — Ambulatory Visit: Payer: Self-pay | Admitting: Family Medicine

## 2024-06-26 ENCOUNTER — Inpatient Hospital Stay (HOSPITAL_COMMUNITY): Attending: Nurse Practitioner

## 2024-06-26 ENCOUNTER — Inpatient Hospital Stay (HOSPITAL_COMMUNITY): Admission: AD | Admit: 2024-06-26 | Discharge: 2024-06-26 | Disposition: A | Discharge status: Home / Self Care

## 2024-06-26 DIAGNOSIS — Z3A01 Less than 8 weeks gestation of pregnancy: Secondary | ICD-10-CM | POA: Insufficient documentation

## 2024-06-26 DIAGNOSIS — O26851 Spotting complicating pregnancy, first trimester: Secondary | ICD-10-CM | POA: Insufficient documentation

## 2024-06-26 DIAGNOSIS — O200 Threatened abortion: Secondary | ICD-10-CM | POA: Diagnosis present

## 2024-06-26 DIAGNOSIS — O3680X0 Pregnancy with inconclusive fetal viability, not applicable or unspecified: Secondary | ICD-10-CM

## 2024-06-26 DIAGNOSIS — N939 Abnormal uterine and vaginal bleeding, unspecified: Secondary | ICD-10-CM

## 2024-06-26 LAB — HCG, QUANTITATIVE, PREGNANCY: hCG, Beta Chain, Quant, S: 1139 m[IU]/mL — ABNORMAL HIGH

## 2024-06-26 NOTE — MAU Note (Signed)
 Leslie Arroyo is a 33 y.o. at [redacted]w[redacted]d here in MAU reporting: she's here for repeat blood work.  Reports mild cramping and light VB.  LMP: 05/08/2024 Onset of complaint: ongoing Pain score: 3 Vitals:   06/26/24 0953  BP: (!) 141/96  Pulse: 63  Resp: 18  Temp: 98.3 F (36.8 C)  SpO2: 99%     FHT: NA  Lab orders placed from triage: HCG

## 2024-06-26 NOTE — MAU Provider Note (Signed)
 Chief Complaint:  HCG Level   HPI   None     Leslie Arroyo is a 33 y.o. G3P0020 at [redacted]w[redacted]d who presents to maternity admissions for repeat hCG. She was initially seen on 06/24/2024 reporting vaginal bleeding with clots and intermittent abdominal cramping. hCG at that time was 929. Patient has a history of 2 prior losses and is worried about another miscarriage. She reports that she is still having some light vaginal bleeding.  Past Medical History:  Diagnosis Date   History of kidney stones 08/08/2017   Kidney stones    Prediabetes    OB History  Gravida Para Term Preterm AB Living  3    2   SAB IAB Ectopic Multiple Live Births  1        # Outcome Date GA Lbr Len/2nd Weight Sex Type Anes PTL Lv  3 Current           2 SAB 06/2018          1 AB            Past Surgical History:  Procedure Laterality Date   EXTRACORPOREAL SHOCK WAVE LITHOTRIPSY Left 09/16/2018   Procedure: EXTRACORPOREAL SHOCK WAVE LITHOTRIPSY (ESWL);  Surgeon: Devere Lonni Righter, MD;  Location: [facility];  Service: Urology;  Laterality: Left;   HARDWARE REMOVAL Right    leg   KNEE ARTHROSCOPY Left    LEG SURGERY Right    WISDOM TOOTH EXTRACTION     Family History  Problem Relation Age of Onset   Hypertension Mother    Diabetes Mother    Rheum arthritis Mother    Lupus Mother    Cancer Father 71       kidney cancer   Congenital heart disease Brother    Breast cancer Paternal Uncle 78   Rheum arthritis Maternal Grandmother    Hypertension Maternal Grandmother    Diabetes Maternal Grandmother    Cancer Maternal Grandmother        stomach   Hypertension Maternal Grandfather    Diabetes Maternal Grandfather    Social History[1] Allergies[2] No medications prior to admission.    I have reviewed patient's Past Medical Hx, Surgical Hx, Family Hx, Social Hx, medications and allergies.   ROS  Pertinent items noted in HPI and remainder of comprehensive ROS otherwise negative.   PHYSICAL EXAM   Patient Vitals for the past 24 hrs:  BP Temp Temp src Pulse Resp SpO2 Height Weight  06/26/24 0953 (!) 141/96 98.3 F (36.8 C) Oral 63 18 99 % -- --  06/26/24 0950 -- -- -- -- -- -- 5' 6 (1.676 m) 105.5 kg    Constitutional: Well-developed, well-nourished female in no acute distress.  HEENT: atraumatic, normocephalic. Neck has normal ROM. EOM intact. Cardiovascular: normal rate & rhythm, warm and well-perfused Respiratory: normal effort, no problems with respiration noted GI: Abd soft, non-tender, non-distended MSK: Extremities nontender, no edema, normal ROM Skin: warm and dry. Acyanotic, no jaundice or pallor. Neurologic: Alert and oriented x 4. No abnormal coordination. Psychiatric: Normal mood. Speech not slurred, not rapid/pressured. Patient is cooperative.  Labs: No results found for this or any previous visit (from the past 24 hours).  Imaging:  No results found.  MDM & MAU COURSE  MDM: Moderate  MAU Course: -Mildly elevated BP in setting of chronic HTN, otherwise within normal limits.  -Repeat hCG. Discussed waiting for results vs phone call, patient would prefer going home and getting a phone call with results.  Orders  Placed This Encounter  Procedures   hCG, quantitative, pregnancy   Discharge patient   No orders of the defined types were placed in this encounter.   ASSESSMENT   1. Vaginal bleeding   2. Threatened miscarriage   3. [redacted] weeks gestation of pregnancy     PLAN  Discharge home in stable condition with return precautions.  Will call with results of hCG.     Allergies as of 06/26/2024       Reactions   Dust Mite Extract         Medication List     TAKE these medications    hydroxychloroquine  200 MG tablet Commonly known as: Plaquenil  Take 1 tablet (200 mg total) by mouth 2 (two) times daily.   labetalol  200 MG tablet Commonly known as: NORMODYNE  Take 1 tablet by mouth twice daily   methocarbamol  500 MG tablet Commonly known  as: ROBAXIN  Take 1 tablet (500 mg total) by mouth every 8 (eight) hours as needed for muscle spasms.   NIFEdipine  30 MG 24 hr tablet Commonly known as: PROCARDIA -XL/NIFEDICAL-XL Take 1 tablet by mouth once daily   ondansetron  4 MG disintegrating tablet Commonly known as: ZOFRAN -ODT Take 1 tablet (4 mg total) by mouth every 8 (eight) hours as needed for nausea or vomiting.   prenatal multivitamin Tabs tablet Take 1 tablet by mouth daily at 12 noon.        Joesph DELENA Sear, PA     [1]  Social History Tobacco Use   Smoking status: Never    Passive exposure: Never   Smokeless tobacco: Never  Vaping Use   Vaping status: Never Used  Substance Use Topics   Alcohol use: Not Currently   Drug use: Never  [2]  Allergies Allergen Reactions   Dust Mite Extract

## 2024-07-02 ENCOUNTER — Encounter: Payer: Self-pay | Admitting: Nurse Practitioner

## 2024-07-02 DIAGNOSIS — Z3A01 Less than 8 weeks gestation of pregnancy: Secondary | ICD-10-CM

## 2024-07-04 ENCOUNTER — Other Ambulatory Visit: Payer: Self-pay

## 2024-07-04 ENCOUNTER — Inpatient Hospital Stay (HOSPITAL_BASED_OUTPATIENT_CLINIC_OR_DEPARTMENT_OTHER)
Admission: RE | Admit: 2024-07-04 | Discharge: 2024-07-04 | Disposition: A | Discharge status: Home / Self Care | Source: Ambulatory Visit | Attending: Nurse Practitioner | Admitting: Nurse Practitioner

## 2024-07-04 ENCOUNTER — Encounter (HOSPITAL_COMMUNITY): Payer: Self-pay | Admitting: Obstetrics and Gynecology

## 2024-07-04 ENCOUNTER — Telehealth: Payer: Self-pay

## 2024-07-04 ENCOUNTER — Inpatient Hospital Stay (HOSPITAL_COMMUNITY)
Admission: AD | Admit: 2024-07-04 | Discharge: 2024-07-04 | Disposition: A | Discharge status: Home / Self Care | Attending: Obstetrics and Gynecology | Admitting: Obstetrics and Gynecology

## 2024-07-04 DIAGNOSIS — Z3A01 Less than 8 weeks gestation of pregnancy: Secondary | ICD-10-CM

## 2024-07-04 DIAGNOSIS — O00101 Right tubal pregnancy without intrauterine pregnancy: Secondary | ICD-10-CM | POA: Diagnosis present

## 2024-07-04 DIAGNOSIS — D251 Intramural leiomyoma of uterus: Secondary | ICD-10-CM | POA: Insufficient documentation

## 2024-07-04 DIAGNOSIS — O3680X0 Pregnancy with inconclusive fetal viability, not applicable or unspecified: Secondary | ICD-10-CM | POA: Insufficient documentation

## 2024-07-04 DIAGNOSIS — O3411 Maternal care for benign tumor of corpus uteri, first trimester: Secondary | ICD-10-CM | POA: Insufficient documentation

## 2024-07-04 LAB — CBC
HCT: 35.8 % — ABNORMAL LOW (ref 36.0–46.0)
Hemoglobin: 11.3 g/dL — ABNORMAL LOW (ref 12.0–15.0)
MCH: 23.2 pg — ABNORMAL LOW (ref 26.0–34.0)
MCHC: 31.6 g/dL (ref 30.0–36.0)
MCV: 73.4 fL — ABNORMAL LOW (ref 80.0–100.0)
Platelets: 273 K/uL (ref 150–400)
RBC: 4.88 MIL/uL (ref 3.87–5.11)
RDW: 17.5 % — ABNORMAL HIGH (ref 11.5–15.5)
WBC: 8.7 K/uL (ref 4.0–10.5)
nRBC: 0 % (ref 0.0–0.2)

## 2024-07-04 LAB — COMPREHENSIVE METABOLIC PANEL WITH GFR
ALT: 13 U/L (ref 0–44)
AST: 17 U/L (ref 15–41)
Albumin: 4.1 g/dL (ref 3.5–5.0)
Alkaline Phosphatase: 73 U/L (ref 38–126)
Anion gap: 10 (ref 5–15)
BUN: 7 mg/dL (ref 6–20)
CO2: 23 mmol/L (ref 22–32)
Calcium: 8.9 mg/dL (ref 8.9–10.3)
Chloride: 103 mmol/L (ref 98–111)
Creatinine, Ser: 0.74 mg/dL (ref 0.44–1.00)
GFR, Estimated: >60 mL/min
Glucose, Bld: 79 mg/dL (ref 70–99)
Potassium: 3.9 mmol/L (ref 3.5–5.1)
Sodium: 136 mmol/L (ref 135–145)
Total Bilirubin: 0.3 mg/dL (ref 0.0–1.2)
Total Protein: 7.6 g/dL (ref 6.5–8.1)

## 2024-07-04 LAB — HCG, QUANTITATIVE, PREGNANCY: hCG, Beta Chain, Quant, S: 3885 m[IU]/mL — ABNORMAL HIGH

## 2024-07-04 MED ORDER — METHOTREXATE FOR ECTOPIC PREGNANCY
50.0000 mg/m2 | Freq: Once | INTRAMUSCULAR | Status: AC
  Administered 2024-07-04: 112.5 mg via INTRAMUSCULAR
  Filled 2024-07-04: qty 4.5

## 2024-07-04 NOTE — Telephone Encounter (Signed)
 Spoke with Leslie Arroyo with radiology that patient has right tubal ectopic pregnancy 6cm. Patient is at [facility] and waiting in ultrasound area.

## 2024-07-04 NOTE — Telephone Encounter (Signed)
 Called and spoke with patient. She will go to MAU at [facility]

## 2024-07-04 NOTE — MAU Provider Note (Signed)
 " History     CSN: [id number]  Arrival date and time: 07/04/24 1008   Event Date/Time   First Provider Initiated Contact with Patient 07/04/24 1215      Chief Complaint  Patient presents with   Ectopic Pregnancy   HPI Leslie Arroyo is a 34 y.o. G3P0020 at [redacted]w[redacted]d who presents for management of ectopic pregnancy. Initially had labs followed by her PCP. Came to MAU on 12/23 for vaginal bleeding & again 12/25 for follow up HCG. Outpatient ultrasound today as ordered by her PCP shows right ectopic pregnancy. Patient was called by her PCP and instructed to come to MAU.  Denies abdominal pain or vaginal bleeding since last week.  Has scheduled OB appointment with [facility] 1/7 (hasn't been seen there before).   OB History     Gravida  3   Para      Term      Preterm      AB  2   Living         SAB  2   IAB      Ectopic      Multiple      Live Births              Past Medical History:  Diagnosis Date   History of kidney stones 08/08/2017   Kidney stones    Prediabetes     Past Surgical History:  Procedure Laterality Date   EXTRACORPOREAL SHOCK WAVE LITHOTRIPSY Left 09/16/2018   Procedure: EXTRACORPOREAL SHOCK WAVE LITHOTRIPSY (ESWL);  Surgeon: Devere Lonni Righter, MD;  Location: [facility];  Service: Urology;  Laterality: Left;   HARDWARE REMOVAL Right    leg   KNEE ARTHROSCOPY Left    LEG SURGERY Right    WISDOM TOOTH EXTRACTION      Family History  Problem Relation Age of Onset   Hypertension Mother    Diabetes Mother    Rheum arthritis Mother    Lupus Mother    Cancer Father 76       kidney cancer   Congenital heart disease Brother    Breast cancer Paternal Uncle 45   Rheum arthritis Maternal Grandmother    Hypertension Maternal Grandmother    Diabetes Maternal Grandmother    Cancer Maternal Grandmother        stomach   Hypertension Maternal Grandfather    Diabetes Maternal Grandfather     Social History[1]  Allergies:  Allergies[2]  No medications prior to admission.    Review of Systems  All other systems reviewed and are negative.  Physical Exam   Blood pressure (!) 138/98, pulse 77, temperature 98.3 F (36.8 C), temperature source Oral, resp. rate 18, height 5' 6 (1.676 m), weight 107.5 kg, last menstrual period 05/08/2024, SpO2 100%.  Physical Exam Vitals and nursing note reviewed.  Constitutional:      General: She is not in acute distress.    Appearance: She is well-developed. She is not ill-appearing.  HENT:     Head: Normocephalic and atraumatic.  Eyes:     General: No scleral icterus.       Right eye: No discharge.        Left eye: No discharge.     Conjunctiva/sclera: Conjunctivae normal.  Pulmonary:     Effort: Pulmonary effort is normal. No respiratory distress.  Neurological:     General: No focal deficit present.     Mental Status: She is alert.  Psychiatric:  Mood and Affect: Mood normal.        Behavior: Behavior normal.     MAU Course  Procedures Results for orders placed or performed during the hospital encounter of 07/04/24 (from the past 24 hours)  CBC     Status: Abnormal   Collection Time: 07/04/24 12:23 PM  Result Value Ref Range   WBC 8.7 4.0 - 10.5 K/uL   RBC 4.88 3.87 - 5.11 MIL/uL   Hemoglobin 11.3 (L) 12.0 - 15.0 g/dL   HCT 64.1 (L) 63.9 - 53.9 %   MCV 73.4 (L) 80.0 - 100.0 fL   MCH 23.2 (L) 26.0 - 34.0 pg   MCHC 31.6 30.0 - 36.0 g/dL   RDW 82.4 (H) 88.4 - 84.4 %   Platelets 273 150 - 400 K/uL   nRBC 0.0 0.0 - 0.2 %  Comprehensive metabolic panel with GFR     Status: None   Collection Time: 07/04/24 12:23 PM  Result Value Ref Range   Sodium 136 135 - 145 mmol/L   Potassium 3.9 3.5 - 5.1 mmol/L   Chloride 103 98 - 111 mmol/L   CO2 23 22 - 32 mmol/L   Glucose, Bld 79 70 - 99 mg/dL   BUN 7 6 - 20 mg/dL   Creatinine, Ser 9.25 0.44 - 1.00 mg/dL   Calcium 8.9 8.9 - 89.6 mg/dL   Total Protein 7.6 6.5 - 8.1 g/dL   Albumin 4.1 3.5 - 5.0 g/dL    AST 17 15 - 41 U/L   ALT 13 0 - 44 U/L   Alkaline Phosphatase 73 38 - 126 U/L   Total Bilirubin 0.3 0.0 - 1.2 mg/dL   GFR, Estimated >39 >39 mL/min   Anion gap 10 5 - 15  hCG, quantitative, pregnancy     Status: Abnormal   Collection Time: 07/04/24 12:23 PM  Result Value Ref Range   hCG, Beta Chain, Quant, S 3,885 (H) <5 mIU/mL   US  OB LESS THAN 14 WEEKS WITH OB TRANSVAGINAL Result Date: 07/04/2024 CLINICAL DATA:  34 year old pregnant female presents for localization of the pregnancy. EDC by LMP: 02/13/2015 projecting to an expected gestational age of [redacted] weeks 1 day. EXAM: OBSTETRIC <14 WK ULTRASOUND TECHNIQUE: Transabdominal ultrasound was performed for evaluation of the gestation as well as the maternal uterus and adnexal regions. COMPARISON:  06/20/2024 obstetric scan. FINDINGS: Intrauterine gestational sac: None Maternal uterus/adnexae: Anteverted uterus. Small 0.9 x 0.8 x 0.8 cm intramural fibroid in the anterior upper uterine body. Bilayer endometrial thickness 7 mm. No focal endometrial mass or endometrial cavity fluid. Right ovary measures 2.2 x 1.9 x 1.4 cm and is normal. There is a right adnexal thick-walled cystic 1.6 x 1.3 x 1.5 cm mass located separate from the right ovary between the right ovary and uterus with peripheral mild vascularity on color Doppler, suspicious for right tubal ectopic gestation. No fetal pole or cardiac activity identified within the right adnexal mass. Left ovary measures 1.8 x 0.8 x 1.5 cm and is normal. No left adnexal mass. No abnormal free fluid in the pelvis. IMPRESSION: 1. Thick walled cystic 1.6 cm right adnexal mass, located between the right ovary and uterus and clearly separate from the right ovary, suspicious for right tubal ectopic gestation. No fetal pole or cardiac activity identified within the right adnexal mass. 2. No abnormal free fluid in the pelvis. 3. Small intramural fibroid in the anterior upper uterine body. Critical Value/emergent results  were called by telephone at the time of  interpretation on 07/04/2024 at 9:38 am to provider LAYMON SHARPS, CNM, who verbally acknowledged these results. Electronically Signed   By: Selinda DELENA Blue M.D.   On: 07/04/2024 09:38    MDM  The risks of methotrexate were reviewed including failure requiring repeat dosing or eventual surgery. She understands that methotrexate involves frequent return visits to monitor lab values and that she remains at risk of ectopic rupture until her beta is less than assay. ?The patient opts to proceed with methotrexate.  She has no history of hepatic or renal dysfunction, has normal BUN/Cr/LFT's/platelets.  She is felt to be reliable for follow-up. Side effects of photosensitivity & GI upset were discussed.  She knows to avoid direct sunlight and abstain from alcohol, aspirin and aspirin-like products for two weeks. She was counseled to discontinue any MVI with folic acid. ?She understands to follow up on D4 (Monday) and D7 (Thursdsay) for repeat BHCG and was given the instruction sheet. ?Strict ectopic precautions were reviewed, the patient knows to call with any abdominal pain, vomiting, fainting, or any concerns with her health.  Rh+, no Rhogam necessary.   Assessment and Plan   1. Right tubal pregnancy without intrauterine pregnancy   2. [redacted] weeks gestation of pregnancy    -Reviewed chart with Dr. Cleatus. Patient with multiple abnormally rising HCGs & ultrasound today that shows right adnexal mass suspicious for ectopic. No evidence of rupture & patient stable at this time. Discussed results & treatment with patient & her spouse. Agreeable with plan & reliable for follow up.  -S/w Dr. Delana (on call provider for [facility]) to determine best location for f/u labs. Dr. Delana will have patient scheduled for lab visit at [facility] on Monday.  -Patient informed of location for f/u, office will call her. Informed patient if she cannot get appointment on Monday, she should come to MAU as  f/u labs are important in setting of ectopic treatment.  -Reviewed reasons to return to MAU  Rocky Satterfield NP 07/04/2024, 5:17 PM      [1]  Social History Tobacco Use   Smoking status: Never    Passive exposure: Never   Smokeless tobacco: Never  Vaping Use   Vaping status: Never Used  Substance Use Topics   Alcohol use: Not Currently   Drug use: Never  [2]  Allergies Allergen Reactions   Dust Mite Extract    "

## 2024-07-04 NOTE — MAU Note (Signed)
 Leslie Arroyo is a 34 y.o. at [redacted]w[redacted]d here in MAU reporting: being sent from the office with ectopic pregnancy. Had bleeding a week ago but none currently and no current pain reported  LMP: - Onset of complaint: from office Pain score: 0/10 Vitals:   07/04/24 1117  BP: (!) 138/98  Pulse: 77  Resp: 18  Temp: 98.3 F (36.8 C)  SpO2: 100%     FHT: na  Lab orders placed from triage: ua

## 2024-07-19 ENCOUNTER — Inpatient Hospital Stay (HOSPITAL_COMMUNITY)

## 2024-07-19 ENCOUNTER — Observation Stay (HOSPITAL_COMMUNITY)
Admission: AD | Admit: 2024-07-19 | Discharge: 2024-07-20 | Disposition: A | Attending: Obstetrics & Gynecology | Admitting: Obstetrics & Gynecology

## 2024-07-19 ENCOUNTER — Encounter (HOSPITAL_COMMUNITY): Admission: AD | Disposition: A | Payer: Self-pay | Source: Home / Self Care | Attending: Obstetrics & Gynecology

## 2024-07-19 ENCOUNTER — Observation Stay (HOSPITAL_COMMUNITY): Admitting: Anesthesiology

## 2024-07-19 DIAGNOSIS — Z79899 Other long term (current) drug therapy: Secondary | ICD-10-CM | POA: Insufficient documentation

## 2024-07-19 DIAGNOSIS — I10 Essential (primary) hypertension: Secondary | ICD-10-CM | POA: Diagnosis not present

## 2024-07-19 DIAGNOSIS — O009 Unspecified ectopic pregnancy without intrauterine pregnancy: Principal | ICD-10-CM | POA: Diagnosis present

## 2024-07-19 HISTORY — PX: DIAGNOSTIC LAPAROSCOPY WITH REMOVAL OF ECTOPIC PREGNANCY: SHX6449

## 2024-07-19 LAB — COMPREHENSIVE METABOLIC PANEL WITH GFR
ALT: 12 U/L (ref 0–44)
AST: 17 U/L (ref 15–41)
Albumin: 4.3 g/dL (ref 3.5–5.0)
Alkaline Phosphatase: 73 U/L (ref 38–126)
Anion gap: 12 (ref 5–15)
BUN: 14 mg/dL (ref 6–20)
CO2: 25 mmol/L (ref 22–32)
Calcium: 8.8 mg/dL — ABNORMAL LOW (ref 8.9–10.3)
Chloride: 103 mmol/L (ref 98–111)
Creatinine, Ser: 0.86 mg/dL (ref 0.44–1.00)
GFR, Estimated: >60 mL/min
Glucose, Bld: 138 mg/dL — ABNORMAL HIGH (ref 70–99)
Potassium: 3.9 mmol/L (ref 3.5–5.1)
Sodium: 140 mmol/L (ref 135–145)
Total Bilirubin: 0.2 mg/dL (ref 0.0–1.2)
Total Protein: 7.8 g/dL (ref 6.5–8.1)

## 2024-07-19 LAB — CBC
HCT: 36 % (ref 36.0–46.0)
Hemoglobin: 11 g/dL — ABNORMAL LOW (ref 12.0–15.0)
MCH: 23 pg — ABNORMAL LOW (ref 26.0–34.0)
MCHC: 30.6 g/dL (ref 30.0–36.0)
MCV: 75.3 fL — ABNORMAL LOW (ref 80.0–100.0)
Platelets: 287 K/uL (ref 150–400)
RBC: 4.78 MIL/uL (ref 3.87–5.11)
RDW: 18.2 % — ABNORMAL HIGH (ref 11.5–15.5)
WBC: 13.2 K/uL — ABNORMAL HIGH (ref 4.0–10.5)
nRBC: 0 % (ref 0.0–0.2)

## 2024-07-19 LAB — PREPARE RBC (CROSSMATCH)

## 2024-07-19 MED ORDER — LIDOCAINE 2% (20 MG/ML) 5 ML SYRINGE
INTRAMUSCULAR | Status: AC
  Filled 2024-07-19: qty 5

## 2024-07-19 MED ORDER — BUPIVACAINE HCL (PF) 0.25 % IJ SOLN
INTRAMUSCULAR | Status: AC
  Filled 2024-07-19: qty 30

## 2024-07-19 MED ORDER — SUCCINYLCHOLINE 20MG/ML (10ML) SYRINGE FOR MEDFUSION PUMP - OPTIME
INTRAMUSCULAR | Status: DC | PRN
  Administered 2024-07-19: 140 mg via INTRAVENOUS

## 2024-07-19 MED ORDER — FENTANYL CITRATE (PF) 250 MCG/5ML IJ SOLN
INTRAMUSCULAR | Status: DC | PRN
  Administered 2024-07-19: 50 ug via INTRAVENOUS
  Administered 2024-07-19: 100 ug via INTRAVENOUS
  Administered 2024-07-20 (×2): 50 ug via INTRAVENOUS

## 2024-07-19 MED ORDER — PHENYLEPHRINE HCL (PRESSORS) 10 MG/ML IV SOLN
INTRAVENOUS | Status: AC
  Filled 2024-07-19: qty 1

## 2024-07-19 MED ORDER — LACTATED RINGERS IV SOLN: INTRAVENOUS | Status: DC | PRN

## 2024-07-19 MED ORDER — LACTATED RINGERS IV BOLUS
1000.0000 mL | Freq: Once | INTRAVENOUS | Status: AC
  Administered 2024-07-19: 1000 mL via INTRAVENOUS

## 2024-07-19 MED ORDER — MIDAZOLAM HCL 2 MG/2ML IJ SOLN
INTRAMUSCULAR | Status: AC
  Filled 2024-07-19: qty 2

## 2024-07-19 MED ORDER — SUCCINYLCHOLINE CHLORIDE 200 MG/10ML IV SOSY
PREFILLED_SYRINGE | INTRAVENOUS | Status: AC
  Filled 2024-07-19: qty 10

## 2024-07-19 MED ORDER — BUPIVACAINE HCL (PF) 0.25 % IJ SOLN
INTRAMUSCULAR | Status: DC | PRN
  Administered 2024-07-19: 30 mL

## 2024-07-19 MED ORDER — SOD CITRATE-CITRIC ACID 500-334 MG/5ML PO SOLN: 30.0000 mL | ORAL | Status: DC

## 2024-07-19 MED ORDER — SODIUM CHLORIDE 0.9 % IR SOLN
Status: DC | PRN
  Administered 2024-07-19: 3000 mL
  Administered 2024-07-19: 1000 mL

## 2024-07-19 MED ORDER — PROPOFOL 10 MG/ML IV BOLUS
INTRAVENOUS | Status: DC | PRN
  Administered 2024-07-19: 150 mg via INTRAVENOUS

## 2024-07-19 MED ORDER — PHENYLEPHRINE 80 MCG/ML (10ML) SYRINGE FOR IV PUSH (FOR BLOOD PRESSURE SUPPORT)
PREFILLED_SYRINGE | INTRAVENOUS | Status: AC
  Filled 2024-07-19: qty 10

## 2024-07-19 MED ORDER — ROCURONIUM BROMIDE 10 MG/ML (PF) SYRINGE
PREFILLED_SYRINGE | INTRAVENOUS | Status: AC
  Filled 2024-07-19: qty 10

## 2024-07-19 MED ORDER — FENTANYL CITRATE (PF) 250 MCG/5ML IJ SOLN
INTRAMUSCULAR | Status: AC
  Filled 2024-07-19: qty 5

## 2024-07-19 MED ORDER — LIDOCAINE HCL (CARDIAC) PF 100 MG/5ML IV SOSY
PREFILLED_SYRINGE | INTRAVENOUS | Status: DC | PRN
  Administered 2024-07-19: 80 mg via INTRAVENOUS

## 2024-07-19 MED ORDER — MIDAZOLAM HCL (PF) 2 MG/2ML IJ SOLN
INTRAMUSCULAR | Status: DC | PRN
  Administered 2024-07-19: 2 mg via INTRAVENOUS

## 2024-07-19 MED ORDER — ROCURONIUM 10MG/ML (10ML) SYRINGE FOR MEDFUSION PUMP - OPTIME
INTRAVENOUS | Status: DC | PRN
  Administered 2024-07-19: 30 mg via INTRAVENOUS
  Administered 2024-07-19: 20 mg via INTRAVENOUS
  Administered 2024-07-19 – 2024-07-20 (×2): 10 mg via INTRAVENOUS

## 2024-07-19 MED ORDER — PHENYLEPHRINE HCL (PRESSORS) 10 MG/ML IV SOLN
INTRAVENOUS | Status: DC | PRN
  Administered 2024-07-19: 160 ug via INTRAVENOUS
  Administered 2024-07-19: 240 ug via INTRAVENOUS
  Administered 2024-07-19: 160 ug via INTRAVENOUS
  Administered 2024-07-19: 240 ug via INTRAVENOUS
  Administered 2024-07-19: 160 ug via INTRAVENOUS
  Administered 2024-07-19: 80 ug via INTRAVENOUS

## 2024-07-19 MED ORDER — PHENYLEPHRINE HCL-NACL 20-0.9 MG/250ML-% IV SOLN
INTRAVENOUS | Status: DC | PRN
  Administered 2024-07-19: 100 ug/min via INTRAVENOUS

## 2024-07-19 MED ORDER — SODIUM CHLORIDE 0.9 % IV SOLN: INTRAVENOUS | Status: DC | PRN

## 2024-07-19 MED ORDER — PROPOFOL 10 MG/ML IV BOLUS
INTRAVENOUS | Status: AC
  Filled 2024-07-19: qty 20

## 2024-07-19 NOTE — MAU Note (Signed)
Transport to OR

## 2024-07-19 NOTE — MAU Note (Signed)
..  Leslie Arroyo is a 34 y.o. at [redacted]w[redacted]d here in MAU reporting:  Had 2nd dose of methotrexate  last week and 30 minutes ago started having lower abodminal tightening pain and shoulder pain.   Pain score: 10/10 Vitals:   07/19/24 2052  BP: 131/88  Pulse: 83  Resp: 18  Temp: (!) 97.5 F (36.4 C)  SpO2: 100%

## 2024-07-19 NOTE — H&P (Signed)
 Leslie Arroyo is a  34 y.o. G34P0020 female at [redacted]w[redacted]d presenting with one hour of severe pelvic and abdominal pain. She has a known ectopic pregnancy and has received two doses of methotrexate  and bHCG has been trended.   OB History: G3, P0020, history of 2 prior miscarriages   Menstrual History: Patient's last menstrual period was 05/08/2024 (exact date).    Past Medical History:  Diagnosis Date   History of kidney stones 08/08/2017   Kidney stones    Prediabetes     Past Surgical History:  Procedure Laterality Date   EXTRACORPOREAL SHOCK WAVE LITHOTRIPSY Left 09/16/2018   Procedure: EXTRACORPOREAL SHOCK WAVE LITHOTRIPSY (ESWL);  Surgeon: Devere Lonni Righter, MD;  Location: [facility];  Service: Urology;  Laterality: Left;   HARDWARE REMOVAL Right    leg   KNEE ARTHROSCOPY Left    LEG SURGERY Right    WISDOM TOOTH EXTRACTION      Family History  Problem Relation Age of Onset   Hypertension Mother    Diabetes Mother    Rheum arthritis Mother    Lupus Mother    Cancer Father 47       kidney cancer   Congenital heart disease Brother    Breast cancer Paternal Uncle 62   Rheum arthritis Maternal Grandmother    Hypertension Maternal Grandmother    Diabetes Maternal Grandmother    Cancer Maternal Grandmother        stomach   Hypertension Maternal Grandfather    Diabetes Maternal Grandfather     Social History:  reports that she has never smoked. She has never been exposed to tobacco smoke. She has never used smokeless tobacco. She reports that she does not currently use alcohol. She reports that she does not use drugs.  Allergies: Allergies[1]  Medications Prior to Admission  Medication Sig Dispense Refill Last Dose/Taking   labetalol  (NORMODYNE ) 200 MG tablet Take 1 tablet by mouth twice daily 180 tablet 0 07/19/2024   NIFEdipine  (PROCARDIA -XL/NIFEDICAL-XL) 30 MG 24 hr tablet Take 1 tablet by mouth once daily 90 tablet 0 07/19/2024   ondansetron  (ZOFRAN -ODT) 4 MG  disintegrating tablet Take 1 tablet (4 mg total) by mouth every 8 (eight) hours as needed for nausea or vomiting. 30 tablet 1 07/19/2024   hydroxychloroquine  (PLAQUENIL ) 200 MG tablet Take 1 tablet (200 mg total) by mouth 2 (two) times daily. 180 tablet 0    methocarbamol  (ROBAXIN ) 500 MG tablet Take 1 tablet (500 mg total) by mouth every 8 (eight) hours as needed for muscle spasms. 30 tablet 0     Review of Systems  Blood pressure (!) 145/100, pulse 76, temperature (!) 97.5 F (36.4 C), temperature source Oral, resp. rate 18, height 5' 6 (1.676 m), weight 108.4 kg, last menstrual period 05/08/2024, SpO2 100%. Physical Exam Vitals reviewed. Exam conducted with a chaperone present.  Constitutional:      General: She is in acute distress.     Appearance: She is obese. She is ill-appearing.  HENT:     Head: Atraumatic.  Cardiovascular:     Rate and Rhythm: Normal rate.  Pulmonary:     Effort: Pulmonary effort is normal.  Abdominal:     Palpations: Abdomen is soft.     Tenderness: There is abdominal tenderness in the right upper quadrant and right lower quadrant.  Skin:    Capillary Refill: Capillary refill takes more than 3 seconds.     Coloration: Skin is pale.  Neurological:     General: No focal  deficit present.     Mental Status: She is alert and oriented to person, place, and time.  Psychiatric:        Mood and Affect: Mood normal.        Behavior: Behavior normal.     Results for orders placed or performed during the hospital encounter of 07/19/24 (from the past 24 hours)  Type and screen [facility]     Status: None (Preliminary result)   Collection Time: 07/19/24  9:05 PM  Result Value Ref Range   ABO/RH(D) PENDING    Antibody Screen PENDING    Sample Expiration      07/22/2024,2359 Performed at [facility], [address], [place], [place] [address]   CBC     Status: Abnormal   Collection Time: 07/19/24  9:07 PM  Result Value Ref Range    WBC 13.2 (H) 4.0 - 10.5 K/uL   RBC 4.78 3.87 - 5.11 MIL/uL   Hemoglobin 11.0 (L) 12.0 - 15.0 g/dL   HCT 63.9 63.9 - 53.9 %   MCV 75.3 (L) 80.0 - 100.0 fL   MCH 23.0 (L) 26.0 - 34.0 pg   MCHC 30.6 30.0 - 36.0 g/dL   RDW 81.7 (H) 88.4 - 84.4 %   Platelets 287 150 - 400 K/uL   nRBC 0.0 0.0 - 0.2 %  Comprehensive metabolic panel     Status: Abnormal   Collection Time: 07/19/24  9:07 PM  Result Value Ref Range   Sodium 140 135 - 145 mmol/L   Potassium 3.9 3.5 - 5.1 mmol/L   Chloride 103 98 - 111 mmol/L   CO2 25 22 - 32 mmol/L   Glucose, Bld 138 (H) 70 - 99 mg/dL   BUN 14 6 - 20 mg/dL   Creatinine, Ser 9.13 0.44 - 1.00 mg/dL   Calcium 8.8 (L) 8.9 - 10.3 mg/dL   Total Protein 7.8 6.5 - 8.1 g/dL   Albumin 4.3 3.5 - 5.0 g/dL   AST 17 15 - 41 U/L   ALT 12 0 - 44 U/L   Alkaline Phosphatase 73 38 - 126 U/L   Total Bilirubin 0.2 0.0 - 1.2 mg/dL   GFR, Estimated >39 >39 mL/min   Anion gap 12 5 - 15    No results found.  Per radiologist call, complex free fluid in the pelvis, no longer right adnexal mass. No gestational sac in the uterus.   Assessment/Plan: Laiklynn Arroyo is a  34 y.o. G78P0020 female at [redacted]w[redacted]d presenting with likely ruptured ectopic pregnancy. -Urgent case requested to be posted at 9:32pm, per OR staff there could be delay due to current level 1 trauma case occurring.  -Patient consented as follows: We thoroughly discussed the risks and benefits of of a laparoscopic treatment of ruptured ectopic pregnancy. Risks include bleeding (she would accept a blood transfusion in an emergency), damage to surrounding structures, and infection. We discussed that any of these complications could lead to the need for longer hospitalization, need for additional procedures/surgeries, or need for more medications. All questions were answered and patient voiced understanding of risks. She would like to proceed with the surgery. -Requested q24min vital signs and IV fluid bolus.   Dispo: Patient  currently stable, but needs to go to OR emergently.    Cherlyn Syring A Maison Agrusa 07/19/2024, 9:39 PM     [1]  Allergies Allergen Reactions   Dust Mite Extract

## 2024-07-19 NOTE — Anesthesia Procedure Notes (Signed)
 Procedure Name: Intubation Date/Time: 07/19/2024 10:37 PM  Performed by: Vaughn Zebedee HERO, CRNAPre-anesthesia Checklist: Patient identified, Emergency Drugs available, Suction available and Patient being monitored Patient Re-evaluated:Patient Re-evaluated prior to induction Oxygen Delivery Method: Circle system utilized Preoxygenation: Pre-oxygenation with 100% oxygen Induction Type: IV induction and Rapid sequence Laryngoscope Size: Miller and 2 Grade View: Grade I Tube type: Oral Tube size: 7.0 mm Number of attempts: 1 Airway Equipment and Method: Stylet Placement Confirmation: ETT inserted through vocal cords under direct vision, positive ETCO2 and breath sounds checked- equal and bilateral Secured at: 23 cm Tube secured with: Tape Dental Injury: Teeth and Oropharynx as per pre-operative assessment

## 2024-07-19 NOTE — MAU Provider Note (Signed)
"   Event Date/Time   First Provider Initiated Contact with Patient 07/19/24 2107     CC/   S/HPI :   [name] is a 34 y.o. G3P0020 patient who presents to MAU today with complaint of ***.     O BP 131/88 (BP Location: Right Arm)   Pulse 83   Temp (!) 97.5 F (36.4 C) (Oral)   Resp 18   Ht 5' 6 (1.676 m)   Wt 108.4 kg   LMP 05/08/2024 (Exact Date)   SpO2 100%   BMI 38.56 kg/m  Physical Exam    MDM  Orders Placed This Encounter  Procedures   US  OB Comp Less 14 Wks    Standing Status:   Standing    Number of Occurrences:   1    Symptom/Reason for Exam:   Ectopic pregnancy [742403]    Symptom/Reason for Exam:   Abdominal pain [[id number]]   CBC    Standing Status:   Standing    Number of Occurrences:   1   Comprehensive metabolic panel    Standing Status:   Standing    Number of Occurrences:   1   Type and screen [facility]     Standing Status:   Standing    Number of Occurrences:   1   Insert peripheral IV    Standing Status:   Standing    Number of Occurrences:   1      No results found for this or any previous visit (from the past 24 hours).      ASSESSMENT Medical screening exam complete  @ 2020 Dr Claire at the bedside for evaluation of patient and consent likely to OR for Ruptured ectopic   PLAN Future Appointments  Date Time Provider Department Center  09/09/2024  1:40 PM Dolphus Reiter, MD [facility] None    Discharge from MAU in stable condition  See AVS for full description of educational information and instructions provided to the patient at time of discharge List of options for follow-up given *** Warning signs for worsening condition that would warrant emergency follow-up discussed Patient may return to MAU as needed   Littie Olam LABOR, NP 07/19/2024 9:15 PM   "

## 2024-07-19 NOTE — Anesthesia Preprocedure Evaluation (Addendum)
"                                    Anesthesia Evaluation  Patient identified by MRN, date of birth, ID band Patient awake    Reviewed: Allergy & Precautions, NPO status , Patient's Chart, lab work & pertinent test results  Airway Mallampati: II  TM Distance: >3 FB Neck ROM: Full    Dental no notable dental hx.    Pulmonary sleep apnea    Pulmonary exam normal breath sounds clear to auscultation       Cardiovascular hypertension, Normal cardiovascular exam Rhythm:Regular Rate:Normal     Neuro/Psych negative neurological ROS     GI/Hepatic Neg liver ROS,GERD  ,,  Endo/Other  negative endocrine ROS    Renal/GU Renal disease     Musculoskeletal negative musculoskeletal ROS (+)    Abdominal  (+) + obese  Peds  Hematology negative hematology ROS (+)   Anesthesia Other Findings   Reproductive/Obstetrics Ectopic                              Anesthesia Physical Anesthesia Plan  ASA: 2 and emergent  Anesthesia Plan: General   Post-op Pain Management: Ofirmev  IV (intra-op)* and Toradol  IV (intra-op)*   Induction: Intravenous, Rapid sequence and Cricoid pressure planned  PONV Risk Score and Plan: Ondansetron , Dexamethasone , Treatment may vary due to age or medical condition and Midazolam   Airway Management Planned: Oral ETT  Additional Equipment:   Intra-op Plan:   Post-operative Plan: Extubation in OR  Informed Consent: I have reviewed the patients History and Physical, chart, labs and discussed the procedure including the risks, benefits and alternatives for the proposed anesthesia with the patient or authorized representative who has indicated his/her understanding and acceptance.     Dental advisory given  Plan Discussed with: CRNA  Anesthesia Plan Comments:          Anesthesia Quick Evaluation  "

## 2024-07-20 ENCOUNTER — Other Ambulatory Visit: Payer: Self-pay

## 2024-07-20 ENCOUNTER — Encounter (HOSPITAL_COMMUNITY): Payer: Self-pay | Admitting: Obstetrics and Gynecology

## 2024-07-20 LAB — CBC WITH DIFFERENTIAL/PLATELET
Abs Immature Granulocytes: 0.14 K/uL — ABNORMAL HIGH (ref 0.00–0.07)
Basophils Absolute: 0 K/uL (ref 0.0–0.1)
Basophils Relative: 0 %
Eosinophils Absolute: 0 K/uL (ref 0.0–0.5)
Eosinophils Relative: 0 %
HCT: 32.8 % — ABNORMAL LOW (ref 36.0–46.0)
Hemoglobin: 10.7 g/dL — ABNORMAL LOW (ref 12.0–15.0)
Immature Granulocytes: 1 %
Lymphocytes Relative: 7 %
Lymphs Abs: 1.4 K/uL (ref 0.7–4.0)
MCH: 25.5 pg — ABNORMAL LOW (ref 26.0–34.0)
MCHC: 32.6 g/dL (ref 30.0–36.0)
MCV: 78.1 fL — ABNORMAL LOW (ref 80.0–100.0)
Monocytes Absolute: 0.7 K/uL (ref 0.1–1.0)
Monocytes Relative: 4 %
Neutro Abs: 16.3 K/uL — ABNORMAL HIGH (ref 1.7–7.7)
Neutrophils Relative %: 88 %
Platelets: 208 K/uL (ref 150–400)
RBC: 4.2 MIL/uL (ref 3.87–5.11)
RDW: 18.5 % — ABNORMAL HIGH (ref 11.5–15.5)
WBC: 18.5 K/uL — ABNORMAL HIGH (ref 4.0–10.5)
nRBC: 0 % (ref 0.0–0.2)

## 2024-07-20 LAB — CBC
HCT: 29.2 % — ABNORMAL LOW (ref 36.0–46.0)
Hemoglobin: 9.6 g/dL — ABNORMAL LOW (ref 12.0–15.0)
MCH: 25.4 pg — ABNORMAL LOW (ref 26.0–34.0)
MCHC: 32.9 g/dL (ref 30.0–36.0)
MCV: 77.2 fL — ABNORMAL LOW (ref 80.0–100.0)
Platelets: 226 K/uL (ref 150–400)
RBC: 3.78 MIL/uL — ABNORMAL LOW (ref 3.87–5.11)
RDW: 18 % — ABNORMAL HIGH (ref 11.5–15.5)
WBC: 12.5 K/uL — ABNORMAL HIGH (ref 4.0–10.5)
nRBC: 0 % (ref 0.0–0.2)

## 2024-07-20 MED ORDER — OXYCODONE HCL 5 MG/5ML PO SOLN: 5.0000 mg | Freq: Once | ORAL | Status: DC | PRN

## 2024-07-20 MED ORDER — IBUPROFEN 600 MG PO TABS: 600.0000 mg | ORAL_TABLET | Freq: Four times a day (QID) | ORAL | Status: DC

## 2024-07-20 MED ORDER — HYDROMORPHONE HCL 1 MG/ML IJ SOLN
0.2500 mg | INTRAMUSCULAR | Status: DC | PRN
  Administered 2024-07-20: 0.25 mg via INTRAVENOUS

## 2024-07-20 MED ORDER — GABAPENTIN 100 MG PO CAPS
100.0000 mg | ORAL_CAPSULE | Freq: Two times a day (BID) | ORAL | Status: DC
  Administered 2024-07-20: 100 mg via ORAL
  Filled 2024-07-20: qty 1

## 2024-07-20 MED ORDER — KETOROLAC TROMETHAMINE 30 MG/ML IJ SOLN: 30.0000 mg | Freq: Once | INTRAMUSCULAR | Status: DC

## 2024-07-20 MED ORDER — OXYCODONE HCL 5 MG PO TABS: 5.0000 mg | ORAL_TABLET | ORAL | Status: DC | PRN

## 2024-07-20 MED ORDER — KETOROLAC TROMETHAMINE 30 MG/ML IJ SOLN
INTRAMUSCULAR | Status: DC | PRN
  Administered 2024-07-20: 30 mg via INTRAVENOUS

## 2024-07-20 MED ORDER — KETOROLAC TROMETHAMINE 30 MG/ML IJ SOLN
30.0000 mg | Freq: Four times a day (QID) | INTRAMUSCULAR | Status: DC
  Administered 2024-07-20: 30 mg via INTRAVENOUS
  Filled 2024-07-20: qty 1

## 2024-07-20 MED ORDER — ONDANSETRON HCL 4 MG/2ML IJ SOLN
INTRAMUSCULAR | Status: DC | PRN
  Administered 2024-07-20: 4 mg via INTRAVENOUS

## 2024-07-20 MED ORDER — OXYCODONE HCL 5 MG PO TABS: 5.0000 mg | ORAL_TABLET | ORAL | 0 refills | Status: AC | PRN

## 2024-07-20 MED ORDER — ONDANSETRON HCL 4 MG PO TABS: 4.0000 mg | ORAL_TABLET | Freq: Four times a day (QID) | ORAL | Status: DC | PRN

## 2024-07-20 MED ORDER — MENTHOL 3 MG MT LOZG: 1.0000 | LOZENGE | OROMUCOSAL | Status: DC | PRN

## 2024-07-20 MED ORDER — ONDANSETRON HCL 4 MG/2ML IJ SOLN: 4.0000 mg | Freq: Four times a day (QID) | INTRAMUSCULAR | Status: DC | PRN

## 2024-07-20 MED ORDER — ACETAMINOPHEN 500 MG PO TABS
1000.0000 mg | ORAL_TABLET | Freq: Four times a day (QID) | ORAL | Status: DC
  Administered 2024-07-20: 1000 mg via ORAL
  Filled 2024-07-20: qty 2

## 2024-07-20 MED ORDER — DROPERIDOL 2.5 MG/ML IJ SOLN
0.6250 mg | Freq: Once | INTRAMUSCULAR | Status: AC | PRN
  Administered 2024-07-20: 0.625 mg via INTRAVENOUS

## 2024-07-20 MED ORDER — SUGAMMADEX SODIUM 200 MG/2ML IV SOLN
INTRAVENOUS | Status: DC | PRN
  Administered 2024-07-20: 200 mg via INTRAVENOUS

## 2024-07-20 MED ORDER — DEXAMETHASONE SOD PHOSPHATE PF 10 MG/ML IJ SOLN
INTRAMUSCULAR | Status: DC | PRN
  Administered 2024-07-20: 10 mg via INTRAVENOUS

## 2024-07-20 MED ORDER — ACETAMINOPHEN 500 MG PO TABS: 500.0000 mg | ORAL_TABLET | Freq: Four times a day (QID) | ORAL | Status: AC

## 2024-07-20 MED ORDER — HYDROMORPHONE HCL 1 MG/ML IJ SOLN
INTRAMUSCULAR | Status: AC
  Filled 2024-07-20: qty 1

## 2024-07-20 MED ORDER — IBUPROFEN 600 MG PO TABS: 600.0000 mg | ORAL_TABLET | Freq: Four times a day (QID) | ORAL | Status: AC

## 2024-07-20 MED ORDER — OXYCODONE HCL 5 MG PO TABS: 5.0000 mg | ORAL_TABLET | Freq: Once | ORAL | Status: DC | PRN

## 2024-07-20 MED ORDER — DROPERIDOL 2.5 MG/ML IJ SOLN
INTRAMUSCULAR | Status: AC
  Filled 2024-07-20: qty 2

## 2024-07-20 MED ORDER — SIMETHICONE 80 MG PO CHEW: 80.0000 mg | CHEWABLE_TABLET | Freq: Four times a day (QID) | ORAL | Status: DC | PRN

## 2024-07-20 NOTE — Op Note (Signed)
 07/19/2024 - 07/20/2024 10:00 PM  12:48 AM  PATIENT:  Leslie Arroyo  34 y.o. female  PRE-OPERATIVE DIAGNOSIS: suspected ruptured ectopic pregnancy, suspected hemoperitoneum  POST-OPERATIVE DIAGNOSIS:  ruptured ectopic pregnancy, hemoperitoneum  PROCEDURE:  Procedures: Laparoscopic right salpingectomy, removal of ectopic pregnancy, removal of hemoperitoneum  SURGEON:  Surgeons and Role:    * Claire Rubie LABOR, MD - Primary    * Abigail Rollo DASEN, MD - Assisting   ANESTHESIA:   local and general  EBL:  1070 mL   BLOOD ADMINISTERED: 2u PRBC  IVF: 2300cc  DRAINS: none   LOCAL MEDICATIONS USED:  MARCAINE      SPECIMEN:  right fallopian tube with ruptured ectopic pregnancy  DISPOSITION OF SPECIMEN:  PATHOLOGY  COUNTS:  YES  Complications: none   Findings: Hemoperitoneum. Bleeding right fallopian tube containing ectopic pregnancy. Normal uterus. Normal ovaries bilaterally. Normal left fallopian tube.  Technique: After adequate general anesthesia was achieved, the patient was prepped and draped in the usual fashion.  The bladder was drained with a red rubber.  A uterine manipulator was placed in the cervix and secured.  A 0.5 cm incision was made at the base of the umbilicus and the veress needle passed inside without aspiration of bowel contents or blood.  The 5 mm trocar was placed without complication after the abdomen was insufflated.  The camera was placed inside. The patient was placed in Trendelenburg position and a 5 mm trocar placed 3cm superior-medial to the right iliac crest under direct visualization of the camera. An 11 mm trocar was placed 3cm superior-medial to the left iliac crest under direct visualization of the camera.   A careful and systematic exploration was performed and the above findings were noted.   The right fallopian tube was tented up with blunt forceps and then cauterized and incised with the ligasure along the mesosalpinx.  The right tube was then  cauterized and incised with ligasure at the cornua. The right tube, containing the ectopic pregnancy was placed in an Endocatch bag and removed from the body and sent to path. Hemostasis of the right mesosalpinx was excellent.  Copious hemoperitoneum was removed from the abdomen and pelvis using suction and irrigation. The left trocar was removed and a Franchot Hummer device was used to close the fascial incision. The insufflation was removed from the abdomen by opening a trocar and providing three manual deep breaths by anesthesia. The 5 mm trocars were removed and the skin incisions closed with subcutaneous 4-0 monocryl. The incisions were covered with dermabond. The incisions were infiltrated with marcaine .  The uterine manipulator was removed and good hemostasis was noted of the cervix and uterus. All instruments were removed from the vagina.   The patient tolerated the procedure well. She was in stable condition and being woken up from anesthesia when I left the room.  Rubie Claire, MD 07/20/2024, 1:00am

## 2024-07-20 NOTE — Discharge Summary (Signed)
 Physician Discharge Summary  Patient ID: Leslie Arroyo MRN: [id number] DOB/AGE: 06-Jan-1991 33 y.o.  Admit date: 07/19/2024 Discharge date: 07/20/2024  Admission Diagnoses: ruptured ectopic pregnancy, hemoperitoneum  Discharge Diagnoses:  Principal Problem:   Ruptured ectopic pregnancy   Discharged Condition: good  Hospital Course: Leslie Arroyo presented in the evening of 1/17 with one hour of severe right sided pelvic and abdominal pain in the setting of a known ectopic pregnancy being managed with methotrexate  and a downtrending bHCG. Her ultrasound demonstrated concern for hemoperitoneum. She was urgently taken to the OR for laparoscopy where ruptured right tubal ectopic pregnancy and hemoperitoneum were confirmed and laparoscopic right salpingectomy, removal of ectopic pregnancy, removal of hemoperitoneum were performed on 1/17-1/18 by Dr. Rubie Husky. She received 2u PRBC intra-op. She tolerated the procedure well.   On POD#1, her Hgb downtrended to 9.6 as expected given her significant blood loss, but her vitals were normal at the time of discharge. I recommended PO iron every other day outpatient, as well as holding her anti-hypertensive medication until she had mildly elevated home blood pressures. On POD#1, pain was well controlled with medication, she was able to void, tolerate PO, ambulate, and pass gas, and she was discharged home in good condition with post-op follow up within one week.      Discharge Exam: Blood pressure 115/78, pulse 84, temperature 98.1 F (36.7 C), temperature source Oral, resp. rate 18, height 5' 6 (1.676 m), weight 108.4 kg, last menstrual period 05/08/2024, SpO2 98%. General: no acute distress Pulm: normal work of breathing on room air Card: well perfused MSK: normal ROM Abd: soft, appropriately tender to palpation. Incisions c/d/I x3 Neuro: no focal deficits, oriented x3 Psych: normal mood, normal thought  Disposition: Discharge disposition:  01-Home or Self Care       Discharge Instructions     Call MD for:  difficulty breathing, headache or visual disturbances   Complete by: As directed    Call MD for:  extreme fatigue   Complete by: As directed    Call MD for:  hives   Complete by: As directed    Call MD for:  persistant dizziness or light-headedness   Complete by: As directed    Call MD for:  persistant nausea and vomiting   Complete by: As directed    Call MD for:  redness, tenderness, or signs of infection (pain, swelling, redness, odor or green/yellow discharge around incision site)   Complete by: As directed    Call MD for:  severe uncontrolled pain   Complete by: As directed    Call MD for:  temperature >100.4   Complete by: As directed    Diet - low sodium heart healthy   Complete by: As directed    Discharge instructions   Complete by: As directed    No driving on narcotics, no sexual activity for 2 weeks.   Increase activity slowly   Complete by: As directed    May shower / Bathe   Complete by: As directed    Shower, no bath for 2 weeks.   Remove dressing in 24 hours   Complete by: As directed    Sexual Activity Restrictions   Complete by: As directed    No sexual activity for 2 weeks.      Allergies as of 07/20/2024       Reactions   Dust Mite Extract         Medication List     PAUSE taking these medications  labetalol  200 MG tablet Wait to take this until your doctor or other care provider tells you to start again. Can restart medication when home blood pressures are in the 140-150s/80-90s Commonly known as: NORMODYNE  Take 1 tablet by mouth twice daily   NIFEdipine  30 MG 24 hr tablet Wait to take this until your doctor or other care provider tells you to start again. Can restart medication when home blood pressures are in the 140-150s/80-90s Commonly known as: PROCARDIA -XL/NIFEDICAL-XL Take 1 tablet by mouth once daily       TAKE these medications    acetaminophen   500 MG tablet Commonly known as: TYLENOL  Take 1 tablet (500 mg total) by mouth every 6 (six) hours.   hydroxychloroquine  200 MG tablet Commonly known as: Plaquenil  Take 1 tablet (200 mg total) by mouth 2 (two) times daily.   ibuprofen  600 MG tablet Commonly known as: ADVIL  Take 1 tablet (600 mg total) by mouth every 6 (six) hours. Start taking on: July 21, 2024   methocarbamol  500 MG tablet Commonly known as: ROBAXIN  Take 1 tablet (500 mg total) by mouth every 8 (eight) hours as needed for muscle spasms.   ondansetron  4 MG disintegrating tablet Commonly known as: ZOFRAN -ODT Take 1 tablet (4 mg total) by mouth every 8 (eight) hours as needed for nausea or vomiting.   oxyCODONE  5 MG immediate release tablet Commonly known as: Oxy IR/ROXICODONE  Take 1 tablet (5 mg total) by mouth every 4 (four) hours as needed for moderate pain (pain score 4-6).        Follow-up Information     Claire Rubie LABOR, MD Follow up in 1 week(s).   Specialty: Obstetrics and Gynecology Contact information: [address] [place] [address] [phone]                 Signed: Rubie LABOR Claire 07/20/2024, 1:50 PM

## 2024-07-20 NOTE — Discharge Instructions (Signed)
 Nothing in the vagina for 2 weeks. No bath- shower ok. Plenty of fiber and hydration. Scheduled over the counter ibuprofen  and Tylenol  every 6 hours for pain (follow the dosage on the bottle). Oxycodone  as often as every 4 hours for breakthrough pain Miralax over the counter as needed for constipation. Gas-X over the counter as needed for gas pain.

## 2024-07-20 NOTE — Progress Notes (Signed)
 POD1 s/p Laparoscopic right salpingectomy, removal of ectopic pregnancy, removal of hemoperitoneum  Subjective: Pain is well controlled, some mild tenderness, but improved from yesterday. Ambulating, voiding, and tolerating PO well, no nausea. No vaginal bleeding. Has not yet passed gas. Denies lightheadedness.   Objective:    07/20/2024    8:18 AM 07/20/2024    6:10 AM 07/20/2024    3:10 AM  Vitals with BMI  Systolic 103 106 896  Diastolic 54 55 61  Pulse 84 82 72   Physical Exam:  General: no acute distress Pulm: normal work of breathing on room air Card: well perfused MSK: normal ROM Abd: soft, appropriately tender to palpation. Incisions c/d/I x3 Neuro: no focal deficits, oriented x3 Psych: normal mood, normal thought  Pertinent labs:  WBC 8.7>13.2>18.5 Hgb 11.3>11.0>10.7>9.6  Assessment/Plan: Leslie Arroyo is a  34 y.o. G3 now P68 female POD1 s/p Laparoscopic right salpingectomy, removal of ectopic pregnancy, removal of hemoperitoneum at [redacted]w[redacted]d after presenting with ruptured ectopic pregnancy and hemoperitoneum. -POD1: Doing well. Will continue to manage pain this morning and await flatus. -mild hypotension without tachycardia: Asx. Hgb remained stable with 2u PRBC given in OR, downtrended to 9.6 as expected this AM. Plan PO iron every other day outpatient.   Dispo: Likely stable for discharge today pending flatus and pain control.   LOS: 0 days    Rubie DELENA Husky, MD 07/20/2024, 8:53 AM

## 2024-07-20 NOTE — Transfer of Care (Signed)
 Immediate Anesthesia Transfer of Care Note  Patient: Leslie Arroyo  Procedure(s) Performed: LAPAROSCOPY, WITH ECTOPIC PREGNANCY SURGICAL TREATMENT, REMOVAL RIGHT TUBAL, (Abdomen)  Patient Location: PACU  Anesthesia Type:General  Level of Consciousness: awake, alert , and oriented  Airway & Oxygen Therapy: Patient Spontanous Breathing  Post-op Assessment: Report given to RN and Post -op Vital signs reviewed and stable  Post vital signs: Reviewed and stable  Last Vitals:  Vitals Value Taken Time  BP 129/69 07/20/24 00:55  Temp    Pulse 82 07/20/24 00:56  Resp 21 07/20/24 00:56  SpO2 95 % 07/20/24 00:56  Vitals shown include unfiled device data.  Last Pain:  Vitals:   07/19/24 2052  TempSrc: Oral         Complications: No notable events documented.

## 2024-07-21 ENCOUNTER — Encounter (HOSPITAL_COMMUNITY): Payer: Self-pay

## 2024-07-21 ENCOUNTER — Inpatient Hospital Stay (HOSPITAL_COMMUNITY)
Admission: AD | Admit: 2024-07-21 | Discharge: 2024-07-21 | Disposition: A | Discharge status: Home / Self Care | Attending: Obstetrics and Gynecology | Admitting: Obstetrics and Gynecology

## 2024-07-21 DIAGNOSIS — T8131XA Disruption of external operation (surgical) wound, not elsewhere classified, initial encounter: Secondary | ICD-10-CM | POA: Diagnosis present

## 2024-07-21 DIAGNOSIS — Y839 Surgical procedure, unspecified as the cause of abnormal reaction of the patient, or of later complication, without mention of misadventure at the time of the procedure: Secondary | ICD-10-CM | POA: Insufficient documentation

## 2024-07-21 NOTE — MAU Note (Signed)
 Patient had laparoscopic surgery on 07/19/2024 for ectopic rupture right side. Patient's incision has had noticeable bleeding since 2000. Patient states that she called EMS and they give her some gauze and she has soaked through 4 small gauze pads before coming to the hospital. VSS, BP 123/69. No pain associated with the bleeding, only minimal pain with movement that patient rates a 4 on a 0-10 pain scale.

## 2024-07-21 NOTE — Anesthesia Postprocedure Evaluation (Signed)
"   Anesthesia Post Note  Patient: Leslie Arroyo  Procedure(s) Performed: LAPAROSCOPY, WITH ECTOPIC PREGNANCY SURGICAL TREATMENT, REMOVAL RIGHT TUBAL, REMOVAL OF HEMOPERITONEUM (Abdomen)     Patient location during evaluation: PACU Anesthesia Type: General Level of consciousness: sedated and patient cooperative Pain management: pain level controlled Vital Signs Assessment: post-procedure vital signs reviewed and stable Respiratory status: spontaneous breathing Cardiovascular status: stable Anesthetic complications: no   No notable events documented.  Last Vitals:  Vitals:   07/20/24 0818 07/20/24 1330  BP: (!) 103/54 115/78  Pulse: 84 84  Resp:  18  Temp:  36.7 C  SpO2:  98%    Last Pain:  Vitals:   07/20/24 1330  TempSrc: Oral  PainSc:                  Norleen Pope      "

## 2024-07-21 NOTE — MAU Provider Note (Signed)
 " History     CSN: [id number]  Arrival date and time: 07/21/24 0006   Event Date/Time   First Provider Initiated Contact with Patient 07/21/24 0057      Chief Complaint  Patient presents with   Bleeding from surgical incision (laproscopic)   HPI [name] is a 34 y.o. year old G49P0020 female who is s/p laparoscopic surgery on 07/19/2024 for a ruptured ectopic at [redacted]w[redacted]d weeks gestation presenting to MAU with reports of bleeding at incision site on the right side of her abdomen starting at 8 PM tonight.  She called EMS and was given gauze by them. She has soaked through 4 small gauze pads before coming to the hospital.  She reports minimal pain with movement; pain rated 4/10.  She receives her GYN care with [facility]; she needs to call to schedule her follow-up appointment with Dr. Claire.  OB History     Gravida  3   Para      Term      Preterm      AB  2   Living         SAB  2   IAB      Ectopic      Multiple      Live Births              Past Medical History:  Diagnosis Date   History of kidney stones 08/08/2017   Kidney stones    Prediabetes     Past Surgical History:  Procedure Laterality Date   EXTRACORPOREAL SHOCK WAVE LITHOTRIPSY Left 09/16/2018   Procedure: EXTRACORPOREAL SHOCK WAVE LITHOTRIPSY (ESWL);  Surgeon: Devere Lonni Righter, MD;  Location: [facility]S;  Service: Urology;  Laterality: Left;   HARDWARE REMOVAL Right    leg   KNEE ARTHROSCOPY Left    LEG SURGERY Right    WISDOM TOOTH EXTRACTION      Family History  Problem Relation Age of Onset   Hypertension Mother    Diabetes Mother    Rheum arthritis Mother    Lupus Mother    Cancer Father 12       kidney cancer   Congenital heart disease Brother    Breast cancer Paternal Uncle 83   Rheum arthritis Maternal Grandmother    Hypertension Maternal Grandmother    Diabetes Maternal Grandmother    Cancer Maternal Grandmother        stomach   Hypertension  Maternal Grandfather    Diabetes Maternal Grandfather     Social History[1]  Allergies: Allergies[2]  Medications Prior to Admission  Medication Sig Dispense Refill Last Dose/Taking   acetaminophen  (TYLENOL ) 500 MG tablet Take 1 tablet (500 mg total) by mouth every 6 (six) hours.   07/20/2024 at  5:00 PM   ibuprofen  (ADVIL ) 600 MG tablet Take 1 tablet (600 mg total) by mouth every 6 (six) hours.   07/20/2024 at  5:00 PM   hydroxychloroquine  (PLAQUENIL ) 200 MG tablet Take 1 tablet (200 mg total) by mouth 2 (two) times daily. 180 tablet 0    [Paused] labetalol  (NORMODYNE ) 200 MG tablet Take 1 tablet by mouth twice daily 180 tablet 0    methocarbamol  (ROBAXIN ) 500 MG tablet Take 1 tablet (500 mg total) by mouth every 8 (eight) hours as needed for muscle spasms. 30 tablet 0    [Paused] NIFEdipine  (PROCARDIA -XL/NIFEDICAL-XL) 30 MG 24 hr tablet Take 1 tablet by mouth once daily 90 tablet 0    ondansetron  (ZOFRAN -ODT) 4 MG  disintegrating tablet Take 1 tablet (4 mg total) by mouth every 8 (eight) hours as needed for nausea or vomiting. 30 tablet 1    oxyCODONE  (OXY IR/ROXICODONE ) 5 MG immediate release tablet Take 1 tablet (5 mg total) by mouth every 4 (four) hours as needed for moderate pain (pain score 4-6). 6 tablet 0     Review of Systems  Constitutional:  Positive for fatigue.  HENT: Negative.    Eyes: Negative.   Respiratory: Negative.    Cardiovascular: Negative.   Gastrointestinal: Negative.   Endocrine: Negative.   Genitourinary: Negative.   Musculoskeletal: Negative.   Skin:  Positive for wound (bleeding from RT laparoscopic site).  Allergic/Immunologic: Negative.   Neurological: Negative.   Hematological: Negative.   Psychiatric/Behavioral: Negative.     Physical Exam   Blood pressure 123/69, pulse 86, temperature 97.9 F (36.6 C), temperature source Oral, resp. rate 18, last menstrual period 05/08/2024, SpO2 97%, unknown if currently breastfeeding.  Physical Exam Vitals  and nursing note reviewed. Exam conducted with a chaperone present Deniece Sprague, RN).  Constitutional:      Appearance: Normal appearance. She is obese.  Cardiovascular:     Rate and Rhythm: Normal rate.  Pulmonary:     Effort: Pulmonary effort is normal.  Abdominal:     Palpations: Abdomen is soft.      Comments: Blood oozing from pin-sized hole in Dermabond  Genitourinary:    Comments: Not indicated Musculoskeletal:        General: Normal range of motion.  Skin:    General: Skin is warm and dry.     Comments: See pic and comment  Neurological:     Mental Status: She is alert and oriented to person, place, and time.  Psychiatric:        Mood and Affect: Mood normal.        Behavior: Behavior normal.        Thought Content: Thought content normal.        Judgment: Judgment normal.    MAU Course  Procedures  MDM Dermabond applied to incision -- pressure dressing applied by RN  Assessment and Plan  1. Postoperative wound dehiscence, initial encounter (Primary) - Advised to F/U with Dr. Claire next week - Discharge home - Patient verbalized an understanding of the plan of care and agrees.    Ala Cart, CNM 07/21/2024, 2:12 AM     [1]  Social History Tobacco Use   Smoking status: Never    Passive exposure: Never   Smokeless tobacco: Never  Vaping Use   Vaping status: Never Used  Substance Use Topics   Alcohol use: Not Currently   Drug use: Never  [2]  Allergies Allergen Reactions   Dust Mite Extract    "

## 2024-07-23 LAB — TYPE AND SCREEN
ABO/RH(D): O POS
Antibody Screen: NEGATIVE
Unit division: 0
Unit division: 0
Unit division: 0
Unit division: 0

## 2024-07-23 LAB — BPAM RBC
Blood Product Expiration Date: 202602072359
Blood Product Expiration Date: 202602072359
Blood Product Expiration Date: 202602072359
Blood Product Expiration Date: 202602092359
ISSUE DATE / TIME: 202601160836
ISSUE DATE / TIME: 202601172227
ISSUE DATE / TIME: 202601172227
Unit Type and Rh: 5100
Unit Type and Rh: 5100
Unit Type and Rh: 5100
Unit Type and Rh: 5100

## 2024-07-23 LAB — SURGICAL PATHOLOGY

## 2024-09-09 ENCOUNTER — Ambulatory Visit: Admitting: Rheumatology
